# Patient Record
Sex: Male | Born: 1940 | Race: Black or African American | Marital: Single | Smoking: Former smoker
Health system: Southern US, Community
[De-identification: ages and names within clinical notes are randomized; demographics above are authoritative.]

## PROBLEM LIST (undated history)

## (undated) DIAGNOSIS — I639 Cerebral infarction, unspecified: Secondary | ICD-10-CM

## (undated) DIAGNOSIS — R569 Unspecified convulsions: Secondary | ICD-10-CM

## (undated) HISTORY — DX: Cerebral infarction, unspecified: I63.9

---

## 2012-05-10 ENCOUNTER — Emergency Department (HOSPITAL_COMMUNITY): Payer: Medicare Other

## 2012-05-10 ENCOUNTER — Inpatient Hospital Stay (HOSPITAL_COMMUNITY)
Admission: EM | Admit: 2012-05-10 | Discharge: 2012-05-13 | DRG: 064 | Disposition: A | Discharge status: Rehabilitation Facility | Payer: Medicare Other | Attending: Internal Medicine | Admitting: Internal Medicine

## 2012-05-10 ENCOUNTER — Encounter (HOSPITAL_COMMUNITY): Payer: Self-pay | Admitting: Emergency Medicine

## 2012-05-10 DIAGNOSIS — I6789 Other cerebrovascular disease: Secondary | ICD-10-CM | POA: Diagnosis present

## 2012-05-10 DIAGNOSIS — E41 Nutritional marasmus: Secondary | ICD-10-CM | POA: Diagnosis present

## 2012-05-10 DIAGNOSIS — Z79899 Other long term (current) drug therapy: Secondary | ICD-10-CM

## 2012-05-10 DIAGNOSIS — E041 Nontoxic single thyroid nodule: Secondary | ICD-10-CM | POA: Diagnosis present

## 2012-05-10 DIAGNOSIS — I639 Cerebral infarction, unspecified: Secondary | ICD-10-CM

## 2012-05-10 DIAGNOSIS — F17200 Nicotine dependence, unspecified, uncomplicated: Secondary | ICD-10-CM | POA: Diagnosis present

## 2012-05-10 DIAGNOSIS — I6350 Cerebral infarction due to unspecified occlusion or stenosis of unspecified cerebral artery: Principal | ICD-10-CM | POA: Diagnosis present

## 2012-05-10 DIAGNOSIS — I63512 Cerebral infarction due to unspecified occlusion or stenosis of left middle cerebral artery: Secondary | ICD-10-CM | POA: Diagnosis present

## 2012-05-10 DIAGNOSIS — G40909 Epilepsy, unspecified, not intractable, without status epilepticus: Secondary | ICD-10-CM | POA: Diagnosis present

## 2012-05-10 DIAGNOSIS — G8190 Hemiplegia, unspecified affecting unspecified side: Secondary | ICD-10-CM | POA: Diagnosis present

## 2012-05-10 DIAGNOSIS — Z681 Body mass index (BMI) 19 or less, adult: Secondary | ICD-10-CM

## 2012-05-10 DIAGNOSIS — I10 Essential (primary) hypertension: Secondary | ICD-10-CM

## 2012-05-10 HISTORY — DX: Unspecified convulsions: R56.9

## 2012-05-10 LAB — CBC
Hemoglobin: 16.8 g/dL (ref 13.0–17.0)
MCH: 32.6 pg (ref 26.0–34.0)
Platelets: 247 10*3/uL (ref 150–400)
RBC: 5.15 MIL/uL (ref 4.22–5.81)
WBC: 8.7 10*3/uL (ref 4.0–10.5)

## 2012-05-10 LAB — PROTIME-INR: INR: 1 (ref 0.00–1.49)

## 2012-05-10 LAB — DIFFERENTIAL
Eosinophils Absolute: 0.1 10*3/uL (ref 0.0–0.7)
Lymphocytes Relative: 30 % (ref 12–46)
Lymphs Abs: 2.6 10*3/uL (ref 0.7–4.0)
Monocytes Relative: 8 % (ref 3–12)
Neutrophils Relative %: 60 % (ref 43–77)

## 2012-05-10 LAB — URINALYSIS, ROUTINE W REFLEX MICROSCOPIC
Bilirubin Urine: NEGATIVE
Hgb urine dipstick: NEGATIVE
Protein, ur: NEGATIVE mg/dL
Urobilinogen, UA: 1 mg/dL (ref 0.0–1.0)

## 2012-05-10 LAB — COMPREHENSIVE METABOLIC PANEL
ALT: 11 U/L (ref 0–53)
Alkaline Phosphatase: 95 U/L (ref 39–117)
BUN: 7 mg/dL (ref 6–23)
CO2: 25 mEq/L (ref 19–32)
Chloride: 104 mEq/L (ref 96–112)
GFR calc Af Amer: >90 mL/min (ref >90)
GFR calc non Af Amer: >90 mL/min (ref >90)
Glucose, Bld: 103 mg/dL — ABNORMAL HIGH (ref 70–99)
Potassium: 3.7 mEq/L (ref 3.5–5.1)
Sodium: 141 mEq/L (ref 135–145)
Total Bilirubin: 0.5 mg/dL (ref 0.3–1.2)
Total Protein: 7.5 g/dL (ref 6.0–8.3)

## 2012-05-10 LAB — POCT I-STAT, CHEM 8
Calcium, Ion: 1.19 mmol/L (ref 1.13–1.30)
Creatinine, Ser: 0.6 mg/dL (ref 0.50–1.35)
Glucose, Bld: 104 mg/dL — ABNORMAL HIGH (ref 70–99)
Hemoglobin: 17.3 g/dL — ABNORMAL HIGH (ref 13.0–17.0)
TCO2: 24 mmol/L (ref 0–100)

## 2012-05-10 LAB — RAPID URINE DRUG SCREEN, HOSP PERFORMED
Amphetamines: NOT DETECTED
Tetrahydrocannabinol: NOT DETECTED

## 2012-05-10 LAB — TROPONIN I: Troponin I: <0.3 ng/mL (ref <0.30)

## 2012-05-10 MED ORDER — SODIUM CHLORIDE 0.9 % IV SOLN
1000.0000 mg | Freq: Two times a day (BID) | INTRAVENOUS | Status: DC
  Administered 2012-05-11 – 2012-05-12 (×4): 1000 mg via INTRAVENOUS
  Filled 2012-05-10 (×7): qty 10

## 2012-05-10 MED ORDER — SODIUM CHLORIDE 0.9 % IV SOLN: INTRAVENOUS | Status: AC

## 2012-05-10 MED ORDER — HEPARIN SODIUM (PORCINE) 5000 UNIT/ML IJ SOLN
5000.0000 [IU] | Freq: Three times a day (TID) | INTRAMUSCULAR | Status: DC
  Administered 2012-05-11 – 2012-05-13 (×9): 5000 [IU] via SUBCUTANEOUS
  Filled 2012-05-10 (×10): qty 1

## 2012-05-10 MED ORDER — SODIUM CHLORIDE 0.9 % IV SOLN
INTRAVENOUS | Status: DC
  Administered 2012-05-11: via INTRAVENOUS

## 2012-05-10 NOTE — ED Provider Notes (Signed)
History     CSN: [id number]  Arrival date & time 05/10/12  1814   First MD Initiated Contact with Patient 05/10/12 1836      Chief Complaint  Patient presents with  . Stroke Symptoms     HPI Pt was seen at 1845.   Per pt, c/o gradual onset and persistence of constant RUE and RLE "weakness" that began when he woke up yesterday morning.  Pt states he "just can't hold anything" with is right hand and is "dragging" his right leg when he walks.  Pt's family states he called them today and told them his symptoms, they then called EMS.  Pt denies visual changes, no tingling/numbness in extremities, no dysphagia. Denies CP/palpitations, no SOB/cough, no abd pain, no N/V/D, no fevers, no injury.     Past Medical History  Diagnosis Date  . Seizures     History reviewed. No pertinent past surgical history.    History  Substance Use Topics  . Smoking status: Current Every Day Smoker -- 0.50 packs/day for 55 years    Types: Cigarettes  . Smokeless tobacco: Not on file  . Alcohol Use: No      Review of Systems ROS: Statement: All systems negative except as marked or noted in the HPI; Constitutional: Negative for fever and chills. ; ; Eyes: Negative for eye pain, redness and discharge. ; ; ENMT: Negative for ear pain, hoarseness, nasal congestion, sinus pressure and sore throat. ; ; Cardiovascular: Negative for chest pain, palpitations, diaphoresis, dyspnea and peripheral edema. ; ; Respiratory: Negative for cough, wheezing and stridor. ; ; Gastrointestinal: Negative for nausea, vomiting, diarrhea, abdominal pain, blood in stool, hematemesis, jaundice and rectal bleeding. . ; ; Genitourinary: Negative for dysuria, flank pain and hematuria. ; ; Musculoskeletal: Negative for back pain and neck pain. Negative for swelling and trauma.; ; Skin: Negative for pruritus, rash, abrasions, blisters, bruising and skin lesion.; ; Neuro: +extremity weakness. Negative for headache, lightheadedness and neck  stiffness. Negative for weakness, altered level of consciousness , altered mental status, paresthesias, involuntary movement, seizure and syncope.       Allergies  Review of patient's allergies indicates no known allergies.  Home Medications   Current Outpatient Rx  Name  Route  Sig  Dispense  Refill  . levETIRAcetam (KEPPRA XR) 500 MG 24 hr tablet   Oral   Take 2,000 mg by mouth at bedtime.           BP 160/98  Pulse 68  Temp(Src) 98.2 F (36.8 C) (Oral)  Resp 18  SpO2 100%  Physical Exam 1850: Physical examination:  Nursing notes reviewed; Vital signs and O2 SAT reviewed;  Constitutional: Well developed, Well nourished, Well hydrated, In no acute distress; Head:  Normocephalic, atraumatic; Eyes: EOMI, PERRL, No scleral icterus; ENMT: Mouth and pharynx normal, Mucous membranes moist; Neck: Supple, Full range of motion, No lymphadenopathy; Cardiovascular: Regular rate and rhythm, No murmur, rub, or gallop; Respiratory: Breath sounds clear & equal bilaterally, No rales, rhonchi, wheezes.  Speaking full sentences with ease, Normal respiratory effort/excursion; Chest: Nontender, Movement normal; Abdomen: Soft, Nontender, Nondistended, Normal bowel sounds; Genitourinary: No CVA tenderness; Extremities: Pulses normal, No tenderness, No edema, No calf edema or asymmetry.; Neuro: AA&Ox3, Major CN grossly intact.  Strength 5/5 LUE and LLE, 4/5 strength RUE and RLE.  DTR 2/4 equal bilat UE's and LE's.  No gross sensory deficits.  Normal cerebellar testing bilat UE's (finger-nose) and LE's (heel-shin).  +right pronator drift.  Speech clear.  No facial droop..; Skin: Color normal, Warm, Dry.   ED Course  Procedures   1900:  Pt not code stroke or TPA candidate due to being outside time window (woke up with symptoms yesterday morning).  ED RN stated fluids dripped from pt's right lower mouth when she attempted swallow screen.  Bedside swallow screen ordered.  Will continue IVF as workup  progresses.   2130:  T/C from MRI Tech: states prelim reading on MRI appears to be +CVA, will add MRA brain now. Dx and testing d/w pt and family.  Questions answered.  Verb understanding, agreeable to admit. T/C to [facility]. Julian Reil, case discussed, including:  HPI, pertinent PM/SHx, VS/PE, dx testing, ED course and treatment:  Agreeable to admit, requests to write temporary orders, obtain tele bed to team 10.   MDM  MDM Reviewed: nursing note and vitals Interpretation: labs, ECG, x-ray and CT scan    Date: 05/10/2012  Rate: 72  Rhythm: normal sinus rhythm  QRS Axis: normal  Intervals: normal  ST/T Wave abnormalities: normal  Conduction Disutrbances:none  Narrative Interpretation:   Old EKG Reviewed: none available.  Results for orders placed during the hospital encounter of 05/10/12  ETHANOL      Result Value Range   Alcohol, Ethyl (B) <11  0 - 11 mg/dL  PROTIME-INR      Result Value Range   Prothrombin Time 13.1  11.6 - 15.2 seconds   INR 1.00  0.00 - 1.49  APTT      Result Value Range   aPTT 32  24 - 37 seconds  CBC      Result Value Range   WBC 8.7  4.0 - 10.5 K/uL   RBC 5.15  4.22 - 5.81 MIL/uL   Hemoglobin 16.8  13.0 - 17.0 g/dL   HCT 30.8  65.7 - 84.6 %   MCV 89.9  78.0 - 100.0 fL   MCH 32.6  26.0 - 34.0 pg   MCHC 36.3 (*) 30.0 - 36.0 g/dL   RDW 96.2  95.2 - 84.1 %   Platelets 247  150 - 400 K/uL  DIFFERENTIAL      Result Value Range   Neutrophils Relative 60  43 - 77 %   Neutro Abs 5.2  1.7 - 7.7 K/uL   Lymphocytes Relative 30  12 - 46 %   Lymphs Abs 2.6  0.7 - 4.0 K/uL   Monocytes Relative 8  3 - 12 %   Monocytes Absolute 0.7  0.1 - 1.0 K/uL   Eosinophils Relative 2  0 - 5 %   Eosinophils Absolute 0.1  0.0 - 0.7 K/uL   Basophils Relative 1  0 - 1 %   Basophils Absolute 0.1  0.0 - 0.1 K/uL  COMPREHENSIVE METABOLIC PANEL      Result Value Range   Sodium 141  135 - 145 mEq/L   Potassium 3.7  3.5 - 5.1 mEq/L   Chloride 104  96 - 112 mEq/L   CO2 25  19  - 32 mEq/L   Glucose, Bld 103 (*) 70 - 99 mg/dL   BUN 7  6 - 23 mg/dL   Creatinine, Ser 3.24  0.50 - 1.35 mg/dL   Calcium 9.8  8.4 - 40.1 mg/dL   Total Protein 7.5  6.0 - 8.3 g/dL   Albumin 4.2  3.5 - 5.2 g/dL   AST 20  0 - 37 U/L   ALT 11  0 - 53 U/L   Alkaline  Phosphatase 95  39 - 117 U/L   Total Bilirubin 0.5  0.3 - 1.2 mg/dL   GFR calc non Af Amer >90  >90 mL/min   GFR calc Af Amer >90  >90 mL/min  TROPONIN I      Result Value Range   Troponin I <0.30  <0.30 ng/mL  URINE RAPID DRUG SCREEN (HOSP PERFORMED)      Result Value Range   Opiates NONE DETECTED  NONE DETECTED   Cocaine NONE DETECTED  NONE DETECTED   Benzodiazepines NONE DETECTED  NONE DETECTED   Amphetamines NONE DETECTED  NONE DETECTED   Tetrahydrocannabinol NONE DETECTED  NONE DETECTED   Barbiturates NONE DETECTED  NONE DETECTED  URINALYSIS, ROUTINE W REFLEX MICROSCOPIC      Result Value Range   Color, Urine YELLOW  YELLOW   APPearance CLEAR  CLEAR   Specific Gravity, Urine 1.012  1.005 - 1.030   pH 7.5  5.0 - 8.0   Glucose, UA NEGATIVE  NEGATIVE mg/dL   Hgb urine dipstick NEGATIVE  NEGATIVE   Bilirubin Urine NEGATIVE  NEGATIVE   Ketones, ur NEGATIVE  NEGATIVE mg/dL   Protein, ur NEGATIVE  NEGATIVE mg/dL   Urobilinogen, UA 1.0  0.0 - 1.0 mg/dL   Nitrite NEGATIVE  NEGATIVE   Leukocytes, UA NEGATIVE  NEGATIVE  GLUCOSE, CAPILLARY      Result Value Range   Glucose-Capillary 100 (*) 70 - 99 mg/dL  POCT I-STAT, CHEM 8      Result Value Range   Sodium 143  135 - 145 mEq/L   Potassium 3.6  3.5 - 5.1 mEq/L   Chloride 105  96 - 112 mEq/L   BUN 5 (*) 6 - 23 mg/dL   Creatinine, Ser 1.61  0.50 - 1.35 mg/dL   Glucose, Bld 096 (*) 70 - 99 mg/dL   Calcium, Ion 0.45  4.09 - 1.30 mmol/L   TCO2 24  0 - 100 mmol/L   Hemoglobin 17.3 (*) 13.0 - 17.0 g/dL   HCT 81.1  91.4 - 78.2 %   Dg Chest 2 View 05/10/2012  *RADIOLOGY REPORT*  Clinical Data: Stroke symptoms  CHEST - 2 VIEW  Comparison: None.  Findings:  Cardiomediastinal silhouette is unremarkable.  No acute infiltrate or pleural effusion.  No pulmonary edema.  Mild thoracic spine osteopenia.  IMPRESSION: No active disease.   Original Report Authenticated By: Natasha Mead, M.D.    Ct Head Wo Contrast 05/10/2012  *RADIOLOGY REPORT*  Clinical Data: 72 year old male who awoke with right-sided weakness yesterday.  Hypertension.  CT HEAD WITHOUT CONTRAST  Technique:  Contiguous axial images were obtained from the base of the skull through the vertex without contrast.  Comparison: None.  Findings: Scattered mild paranasal sinus mucosal thickening. Mastoids are clear. Visualized orbit soft tissues are within normal limits.  Visualized scalp soft tissues are within normal limits. No acute osseous abnormality identified.  Mild Calcified atherosclerosis at the skull base.  Chronic-appearing lacuna/hypodensity right pons.  Chronic-appearing posterior left corona radiata lacunar infarct.  Mildly more indistinct central left corona radiata hypodensity. No midline shift, mass effect, or evidence of mass lesion.  No acute intracranial hemorrhage identified.  No cortical encephalomalacia. No suspicious intracranial vascular hyperdensity. No evidence of cortically based acute infarction identified.  IMPRESSION: Chronic small vessel ischemia.  Age indeterminate area of involvement in the left corona radiata (image 16).  Otherwise no evidence of acute intracranial abnormality.   Original Report Authenticated By: Erskine Speed, M.D.  Laray Anger, DO 05/12/12 1601

## 2012-05-10 NOTE — ED Notes (Signed)
Per EMS pt came from home where he woke up with Right sided weakness yesterday. Pt was positive for right side drift and left leg dragging with ambulation. Pt denies pain. Also c/o numbness to right arm. No hx of HTN - BP elevated 190/120.

## 2012-05-10 NOTE — ED Notes (Signed)
Pt transported to radiology.

## 2012-05-10 NOTE — ED Notes (Signed)
Pt transported to MRI 

## 2012-05-10 NOTE — H&P (Signed)
[facility] History and Physical  Kyle Dudley AOZ:[id number] DOB: 1940/03/24 DOA: 05/10/2012  Referring physician: ED PCP: Default, Provider, MD  Specialists: None  Chief Complaint: R sided weakness  HPI: Kyle Dudley is a 72 y.o. male who presents with RUE and RLE weakness, described as dragging his leg, patient is slightly confused / unclear about how long it has been going on but basically has been going on for either 24 or 48 hours (out of TPA window).  His only previous medical history is a history of seizure disorder for which he takes keppra though his sister had a stroke in 2000.  In the ED the patient was found to have an acute stroke on MRI, hospitalist is admitting.  Review of Systems: 12 systems reviewed and otherwise negative.  Past Medical History  Diagnosis Date  . Seizures    History reviewed. No pertinent past surgical history. Social History:  reports that he has been smoking Cigarettes.  He has a 27.5 pack-year smoking history. He does not have any smokeless tobacco history on file. He reports that he does not drink alcohol. His drug history is not on file.   No Known Allergies  Family History  Problem Relation Age of Onset  . Stroke Sister     2000    Prior to Admission medications   Medication Sig Start Date End Date Taking? Authorizing Provider  levETIRAcetam (KEPPRA XR) 500 MG 24 hr tablet Take 2,000 mg by mouth at bedtime.   Yes Historical Provider, MD   Physical Exam: Filed Vitals:   05/10/12 1829 05/10/12 1836 05/10/12 2300 05/10/12 2330  BP:  160/98 139/88 133/76  Pulse: 68  74 65  Temp: 98.2 F (36.8 C)     TempSrc: Oral     Resp: 18  18 23   SpO2: 100%  99% 99%    General:  NAD, resting comfortably in bed Eyes: PEERLA EOMI ENT: mucous membranes moist Neck: supple w/o JVD Cardiovascular: RRR w/o MRG Respiratory: CTA B Abdomen: soft, nt, nd, bs+ Skin: no rash nor lesion Musculoskeletal: MAE, full ROM all 4 extremities Psychiatric:  normal tone and affect Neurologic: AAOx3, grossly non-focal  Labs on Admission:  Basic Metabolic Panel:  Recent Labs Lab 05/10/12 1845 05/10/12 1911  NA 141 143  K 3.7 3.6  CL 104 105  CO2 25  --   GLUCOSE 103* 104*  BUN 7 5*  CREATININE 0.65 0.60  CALCIUM 9.8  --    Liver Function Tests:  Recent Labs Lab 05/10/12 1845  AST 20  ALT 11  ALKPHOS 95  BILITOT 0.5  PROT 7.5  ALBUMIN 4.2   No results found for this basename: LIPASE, AMYLASE,  in the last 168 hours No results found for this basename: AMMONIA,  in the last 168 hours CBC:  Recent Labs Lab 05/10/12 1845 05/10/12 1911  WBC 8.7  --   NEUTROABS 5.2  --   HGB 16.8 17.3*  HCT 46.3 51.0  MCV 89.9  --   PLT 247  --    Cardiac Enzymes:  Recent Labs Lab 05/10/12 1846  TROPONINI <0.30    BNP (last 3 results) No results found for this basename: PROBNP,  in the last 8760 hours CBG:  Recent Labs Lab 05/10/12 1939  GLUCAP 100*    Radiological Exams on Admission: Dg Chest 2 View  05/10/2012  *RADIOLOGY REPORT*  Clinical Data: Stroke symptoms  CHEST - 2 VIEW  Comparison: None.  Findings: Cardiomediastinal silhouette is unremarkable.  No acute infiltrate or pleural effusion.  No pulmonary edema.  Mild thoracic spine osteopenia.  IMPRESSION: No active disease.   Original Report Authenticated By: Natasha Mead, M.D.    Ct Head Wo Contrast  05/10/2012  *RADIOLOGY REPORT*  Clinical Data: 72 year old male who awoke with right-sided weakness yesterday.  Hypertension.  CT HEAD WITHOUT CONTRAST  Technique:  Contiguous axial images were obtained from the base of the skull through the vertex without contrast.  Comparison: None.  Findings: Scattered mild paranasal sinus mucosal thickening. Mastoids are clear. Visualized orbit soft tissues are within normal limits.  Visualized scalp soft tissues are within normal limits. No acute osseous abnormality identified.  Mild Calcified atherosclerosis at the skull base.   Chronic-appearing lacuna/hypodensity right pons.  Chronic-appearing posterior left corona radiata lacunar infarct.  Mildly more indistinct central left corona radiata hypodensity. No midline shift, mass effect, or evidence of mass lesion.  No acute intracranial hemorrhage identified.  No cortical encephalomalacia. No suspicious intracranial vascular hyperdensity. No evidence of cortically based acute infarction identified.  IMPRESSION: Chronic small vessel ischemia.  Age indeterminate area of involvement in the left corona radiata (image 16).  Otherwise no evidence of acute intracranial abnormality.   Original Report Authenticated By: Erskine Speed, M.D.    Mr Angiogram Head Wo Contrast  05/10/2012  *RADIOLOGY REPORT*  Clinical Data:  Stroke.  Right-sided weakness  MRI HEAD WITHOUT CONTRAST MRA HEAD WITHOUT CONTRAST  Technique:  Multiplanar, multiecho pulse sequences of the brain and surrounding structures were obtained without intravenous contrast. Angiographic images of the head were obtained using MRA technique without contrast.  Comparison:  CT head 05/10/2012  MRI HEAD  Findings:  Acute infarct in the centrum semiovale on the left measuring approximately 15 mm in diameter.  No other acute infarct identified.  Chronic lacunar infarctions in the pons bilaterally.  Chronic infarct in the right thalamus.  Mild chronic microvascular ischemic change in the cerebral white matter.  Small chronic infarct right cerebellum.  Negative for hemorrhage or mass.  Ventricle size is normal.  No shift to the midline structures.  IMPRESSION: Acute infarct in the left centrum semiovale.  Moderate chronic ischemic changes.  MRA HEAD  Findings: Both vertebral arteries are patent to the basilar.  Left vertebral artery is dominant.  The basilar is patent.  Posterior cerebral artery is patent bilaterally.  Posterior cerebral arteries are patent bilaterally.  Right internal carotid artery is patent.  Right anterior and middle cerebral  arteries are patent bilaterally.  Mild stenosis right A1 segment.  The left internal carotid artery is widely patent.  The left anterior and middle cerebral arteries are widely patent.  Negative for cerebral aneurysm.  IMPRESSION: No significant intracranial stenosis.   Original Report Authenticated By: Janeece Riggers, M.D.    Mr Brain Wo Contrast  05/10/2012  *RADIOLOGY REPORT*  Clinical Data:  Stroke.  Right-sided weakness  MRI HEAD WITHOUT CONTRAST MRA HEAD WITHOUT CONTRAST  Technique:  Multiplanar, multiecho pulse sequences of the brain and surrounding structures were obtained without intravenous contrast. Angiographic images of the head were obtained using MRA technique without contrast.  Comparison:  CT head 05/10/2012  MRI HEAD  Findings:  Acute infarct in the centrum semiovale on the left measuring approximately 15 mm in diameter.  No other acute infarct identified.  Chronic lacunar infarctions in the pons bilaterally.  Chronic infarct in the right thalamus.  Mild chronic microvascular ischemic change in the cerebral white matter.  Small chronic infarct right cerebellum.  Negative  for hemorrhage or mass.  Ventricle size is normal.  No shift to the midline structures.  IMPRESSION: Acute infarct in the left centrum semiovale.  Moderate chronic ischemic changes.  MRA HEAD  Findings: Both vertebral arteries are patent to the basilar.  Left vertebral artery is dominant.  The basilar is patent.  Posterior cerebral artery is patent bilaterally.  Posterior cerebral arteries are patent bilaterally.  Right internal carotid artery is patent.  Right anterior and middle cerebral arteries are patent bilaterally.  Mild stenosis right A1 segment.  The left internal carotid artery is widely patent.  The left anterior and middle cerebral arteries are widely patent.  Negative for cerebral aneurysm.  IMPRESSION: No significant intracranial stenosis.   Original Report Authenticated By: Janeece Riggers, M.D.     EKG: Independently  reviewed.  Assessment/Plan Principal Problem:   Acute ischemic left MCA stroke Active Problems:   Seizure disorder   1. Acute ischemic CVA - seen on MRI, on stroke pathway, 2d echo and carotid dopplers pending, neuro consult pending, neuro checks, Asprin 81 daily, lipid panel and a1c pending. 2. Seizure disorder - given that patient failed his swallow screen in ED will convert to keppra 1000 mg iv BID until SLP clears him with swallow eval.  Suspect that he will pass the formal swallow eval as he wasn't having any difficulty with choking but had liquid dribbling from his mouth as the reason for failure.  Neurology consulted and evaluating patient.  Code Status: Full Code (must indicate code status--if unknown or must be presumed, indicate so) Family Communication: Spoke with sister at bedside (indicate person spoken with, if applicable, with phone number if by telephone) Disposition Plan: Admit to inpatient (indicate anticipated LOS)  Time spent: 70 min  Janaiah Vetrano M. [facility] Pager [phone]  If 7PM-7AM, please contact night-coverage [email or web address] Password [facility] 05/10/2012, 11:42 PM

## 2012-05-10 NOTE — ED Notes (Signed)
CBG 100 

## 2012-05-11 ENCOUNTER — Inpatient Hospital Stay (HOSPITAL_COMMUNITY): Payer: Medicare Other

## 2012-05-11 ENCOUNTER — Encounter (HOSPITAL_COMMUNITY): Payer: Self-pay | Admitting: Internal Medicine

## 2012-05-11 DIAGNOSIS — I6789 Other cerebrovascular disease: Secondary | ICD-10-CM

## 2012-05-11 DIAGNOSIS — G40909 Epilepsy, unspecified, not intractable, without status epilepticus: Secondary | ICD-10-CM | POA: Diagnosis present

## 2012-05-11 DIAGNOSIS — I6350 Cerebral infarction due to unspecified occlusion or stenosis of unspecified cerebral artery: Secondary | ICD-10-CM

## 2012-05-11 DIAGNOSIS — I63512 Cerebral infarction due to unspecified occlusion or stenosis of left middle cerebral artery: Secondary | ICD-10-CM | POA: Diagnosis present

## 2012-05-11 LAB — GLUCOSE, CAPILLARY
Glucose-Capillary: 89 mg/dL (ref 70–99)
Glucose-Capillary: 81 mg/dL (ref 70–99)
Glucose-Capillary: 98 mg/dL (ref 70–99)

## 2012-05-11 LAB — LIPID PANEL
Cholesterol: 143 mg/dL (ref 0–200)
HDL: 41 mg/dL (ref >39)
LDL Cholesterol: 93 mg/dL (ref 0–99)
Triglycerides: 47 mg/dL (ref <150)
VLDL: 9 mg/dL (ref 0–40)

## 2012-05-11 LAB — URINE CULTURE: Colony Count: NO GROWTH

## 2012-05-11 MED ORDER — ASPIRIN 300 MG RE SUPP
300.0000 mg | Freq: Once | RECTAL | Status: AC
Start: 2012-05-11 — End: 2012-05-11
  Administered 2012-05-11: 300 mg via RECTAL
  Filled 2012-05-11: qty 1

## 2012-05-11 MED ORDER — ASPIRIN 81 MG PO CHEW
81.0000 mg | CHEWABLE_TABLET | Freq: Every day | ORAL | Status: DC
  Administered 2012-05-12 – 2012-05-13 (×2): 81 mg via ORAL
  Filled 2012-05-11 (×3): qty 1

## 2012-05-11 NOTE — Evaluation (Addendum)
Physical Therapy Evaluation Patient Details Name: Kyle Dudley MRN: [id number] DOB: 02/16/1940 Today's Date: 05/11/2012 Time: 4098-1191 PT Time Calculation (min): 39 min  PT Assessment / Plan / Recommendation Clinical Impression  72 y/o male adm for right sided weakness RUE>RLE with MRI revealing acute lacunar infarct left CSO. Presents with below impairments affecting independence and PLOF. Patient lives at home alone and was functioning independently prior to admission. Rec CIR at this time to maximize independence for d/c home alone. Will benefit from physical therapy in the acute setting to address below impairments and facilitate safe d/c plan.     PT Assessment  Patient needs continued PT services    Follow Up Recommendations  CIR(pt reports he would like to go home, would require 24 hour assist right now, unsure if his family can provide that)    Does the patient have the potential to tolerate intense rehabilitation      Barriers to Discharge Decreased caregiver support thinks his nephew can help out or stay with him potentially however nephew also has to help out his mother    Equipment Recommendations  None recommended by PT    Recommendations for Other Services OT consult;Speech consult   Frequency Min 4X/week    Precautions / Restrictions Precautions Precautions: Fall Restrictions Weight Bearing Restrictions: No   Pertinent Vitals/Pain Denies pain      Mobility  Bed Mobility Bed Mobility: Supine to Sit Supine to Sit: 5: Supervision Details for Bed Mobility Assistance: increased time due to weakness Transfers Transfers: Sit to Stand;Stand to Sit Sit to Stand: 4: Min guard;With upper extremity assist Stand to Sit: 4: Min guard Details for Transfer Assistance: gaurding for stability, pt holding onto bed rail throughout standing for increased support and fear of falling Ambulation/Gait Ambulation/Gait Assistance: 4: Min assist Ambulation Distance (Feet): 100  Feet Assistive device: None Ambulation/Gait Assistance Details: min stability assist especially with more challenging activities, staggers when distracted with delayed balance reaction; cueing to exagerrate step with LLE to decrease dragging, this draggin increases as he fatigues; cues for forward gaze Gait Pattern: Step-through pattern;Decreased step length - right;Decreased weight shift to right Gait velocity: slow and gaurded Stairs: No    Exercises General Exercises - Upper Extremity Elbow Flexion: AROM;Both;5 reps;Seated General Exercises - Lower Extremity Hip Flexion/Marching: AROM;Both;10 reps;Seated Toe Raises: AROM;Both;10 reps;Seated Heel Raises: AROM;Both;10 reps;Seated Other Exercises Other Exercises: Forearm pronation/supination x 10 (RUE)   PT Diagnosis: Difficulty walking;Abnormality of gait;Hemiplegia dominant side;Generalized weakness  PT Problem List: Decreased strength;Decreased activity tolerance;Decreased balance;Decreased mobility;Decreased coordination;Decreased knowledge of use of DME PT Treatment Interventions: DME instruction;Gait training;Functional mobility training;Therapeutic activities;Therapeutic exercise;Balance training;Neuromuscular re-education;Patient/family education   PT Goals Acute Rehab PT Goals PT Goal Formulation: With patient Time For Goal Achievement: 05/18/12 Potential to Achieve Goals: Good Pt will go Supine/Side to Sit: with modified independence PT Goal: Supine/Side to Sit - Progress: Goal set today Pt will go Sit to Supine/Side: with modified independence PT Goal: Sit to Supine/Side - Progress: Goal set today Pt will go Sit to Stand: with modified independence PT Goal: Sit to Stand - Progress: Goal set today Pt will go Stand to Sit: with modified independence PT Goal: Stand to Sit - Progress: Goal set today Pt will Transfer Bed to Chair/Chair to Bed: with modified independence PT Transfer Goal: Bed to Chair/Chair to Bed - Progress:  Goal set today Pt will Stand: Independently;3 - 5 min;with no upper extremity support PT Goal: Stand - Progress: Goal set today Pt will Ambulate: >150 feet;with  modified independence;with least restrictive assistive device;with gait velocity >(comment) ft/second (>/=1.8 ft sec ) PT Goal: Ambulate - Progress: Goal set today Pt will Perform Home Exercise Program: Independently PT Goal: Perform Home Exercise Program - Progress: Goal set today  Visit Information  Last PT Received On: 05/11/12 Assistance Needed: +1    Subjective Data  Subjective: Patient very upset and tearful throughout session. "I want to work on my arm" Patient Stated Goal: home   Prior Functioning  Home Living Lives With: Alone Available Help at Discharge: Family;Available PRN/intermittently Type of Home: Independent living facility Home Access: Level entry Home Layout: One level Bathroom Shower/Tub: Engineer, manufacturing systems: Standard Home Adaptive Equipment: None Additional Comments: From what I gather he lives in an ILF, says he has an emergency string in his bathroom Prior Function Level of Independence: Independent Able to Take Stairs?: Yes Driving: Yes Vocation: Retired Musician: No difficulties Dominant Hand: Right    Cognition  Cognition Arousal/Alertness: Awake/alert Behavior During Therapy: WFL for tasks assessed/performed Overall Cognitive Status: Within Functional Limits for tasks assessed    Extremity/Trunk Assessment Right Upper Extremity Assessment RUE ROM/Strength/Tone: Deficits RUE ROM/Strength/Tone Deficits: grossly 3+/5 RUE Sensation: WFL - Light Touch RUE Coordination: Deficits RUE Coordination Deficits: motor delay, decreased grip and fine motor, difficulty with picking objects up from the table Left Upper Extremity Assessment LUE ROM/Strength/Tone: Within functional levels Right Lower Extremity Assessment RLE ROM/Strength/Tone: Deficits RLE  ROM/Strength/Tone Deficits: grossly 4+/5 RLE Sensation: WFL - Light Touch RLE Coordination: Deficits RLE Coordination Deficits: motor delay Left Lower Extremity Assessment LLE ROM/Strength/Tone: Within functional levels Trunk Assessment Trunk Assessment: Normal   Balance Balance Balance Assessed: Yes Dynamic Sitting Balance Dynamic Sitting - Balance Support: No upper extremity supported Dynamic Sitting - Level of Assistance: 5: Stand by assistance Dynamic Sitting Balance - Compensations: when challenged with MMT his trunk tends to drift to the right with decreased awareness, cues to awareness and correction Reach (Patient is able to reach ___ inches to right, left, forward, back): picking up objects of various sizes and reaching accross midline and outside BOS to place or hand them to me (focus on trunk control and grasp activities) (pt in recliner with trunk away from the back of the chair) Dynamic Sitting - Balance Activities: Lateral lean/weight shifting;Reaching for objects;Reaching across midline;Reaching for weighted objects;Trunk control activities Static Standing Balance Static Standing - Balance Support: Left upper extremity supported;No upper extremity supported Static Standing - Level of Assistance: 4: Min assist;5: Stand by assistance Static Standing - Comment/# of Minutes: with LUE support pt needs mingaurdA, without needing minA  High Level Balance High Level Balance Comments: minA with backward walking (even with slow pace), difficulty changing speeds on command, staggering especially with head turns as he becomes distracted  End of Session PT - End of Session Equipment Utilized During Treatment: Gait belt Activity Tolerance: Patient tolerated treatment well Patient left: in chair;with call bell/phone within reach Nurse Communication: Mobility status  GP     [facility] HELEN 05/11/2012, 10:10 AM

## 2012-05-11 NOTE — Progress Notes (Signed)
UR COMPLETED  

## 2012-05-11 NOTE — Progress Notes (Signed)
INITIAL NUTRITION ASSESSMENT  Pt meets criteria for SEVERE MALNUTRITION in the context of chronic illness as evidenced by severe muscle and fat wasting.  DOCUMENTATION CODES Per approved criteria  -Severe malnutrition in the context of chronic illness -Underweight   INTERVENTION: Ensure Complete po BID, each supplement provides 350 kcal and 13 grams of protein.  NUTRITION DIAGNOSIS: Unintentional weight loss related to poor appetite as evidenced by 9% weight loss.   Goal: Pt to meet >/= 90% of their estimated nutrition needs.   Monitor:  PO intake, supplement acceptance, weight trend, labs  Reason for Assessment: Rounds  72 y.o. male  Admitting Dx: Acute ischemic left MCA stroke  ASSESSMENT: Pt with left centrum semiovale infarct. CIR consulting. Pt confirms weight loss PTA, but is unsure of time frame. 24 hr recall reviewed, pt consuming only 2 meals per day which he prepares. Breakfast is usually an egg and grits, Supper is a piece of protein such as a pork chop and mashed potatoes. Pt does like ensure but does not drink consistently.   Height: Ht Readings from Last 1 Encounters:  05/11/12 5\' 4"  (1.626 m)    Weight: Wt Readings from Last 1 Encounters:  05/11/12 105 lb 13.1 oz (48 kg)    Ideal Body Weight: 59 kg  % Ideal Body Weight: 81%  Wt Readings from Last 10 Encounters:  05/11/12 105 lb 13.1 oz (48 kg)    Usual Body Weight: 115 lb   % Usual Body Weight: 91%  BMI:  Body mass index is 18.16 kg/(m^2). Underweight  Estimated Nutritional Needs: Kcal: 1500-1700 Protein: 75-90 grams Fluid: > 1.5 L/day  Skin: no issues noted  Nutrition Focused Physical Exam:  Subcutaneous Fat:  Orbital Region: WNL Upper Arm Region: moderate wasting Thoracic and Lumbar Region: severe wasting  Muscle:  Temple Region: mild wasting Clavicle Bone Region: severe wasting Clavicle and Acromion Bone Region: severe wasting Scapular Bone Region: mild wasting Dorsal Hand:  NA Patellar Region: severe wasting Anterior Thigh Region: severe wasting Posterior Calf Region: moderate wasting  Edema: not present    Diet Order: NPO Meal Completion: 75% per pt report  EDUCATION NEEDS: -No education needs identified at this time  No intake or output data in the 24 hours ending 05/11/12 1238  Last BM: 4/15   Labs:   Recent Labs Lab 05/10/12 1845 05/10/12 1911  NA 141 143  K 3.7 3.6  CL 104 105  CO2 25  --   BUN 7 5*  CREATININE 0.65 0.60  CALCIUM 9.8  --   GLUCOSE 103* 104*    CBG (last 3)   Recent Labs  05/10/12 1939 05/11/12 0135 05/11/12 0628  GLUCAP 100* 89 81    Scheduled Meds: . sodium chloride   Intravenous STAT  . aspirin  81 mg Oral Daily  . aspirin  300 mg Rectal Once  . heparin  5,000 Units Subcutaneous Q8H  . levETIRAcetam  1,000 mg Intravenous Q12H    Continuous Infusions:   Past Medical History  Diagnosis Date  . Seizures     History reviewed. No pertinent past surgical history.  Kendell Bane RD, LDN, CNSC [phone] Pager [phone] After Hours Pager

## 2012-05-11 NOTE — Progress Notes (Signed)
  Echocardiogram 2D Echocardiogram has been performed.  Saleh Ulbrich, [facility] 05/11/2012, 11:52 AM

## 2012-05-11 NOTE — Evaluation (Addendum)
Clinical/Bedside Swallow Evaluation Patient Details  Name: Kyle Dudley MRN: [id number] Date of Birth: 04-03-1940  Today's Date: 05/11/2012 Time: 1412-     Past Medical History:  Past Medical History  Diagnosis Date  . Seizures    Past Surgical History: History reviewed. No pertinent past surgical history. HPI:  Kyle Dudley is a 72 y.o. male who presents with RUE and RLE weakness, described as dragging his leg, patient is slightly confused / unclear about how long it has been going on but basically has been going on for either 24 or 48 hours (out of TPA window).  His only previous medical history is a history of seizure disorder for which he takes keppra though his sister had a stroke in 2000.  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  Chronic infarcts in right thalamus.  CXR No active disease.   Assessment / Plan / Recommendation Clinical Impression  Pt. presents with mild oral motor weakness (right) which did not appear to affect mastication/manipulation or transit of solid bolus texture.  No s/s evident with cup or straw sips thin water, puree or cracker. Pt. at higher aspiration risk with acute CVA and chronic bilateral pons infarcts.  herapeutic intervention included general swallow precautions with rationale on potential difficulty with straws. Recommend pt. initiate a regular texture consistency diet and thin liquids, straws ok and pills with water (if difficutly pills may be given with puree).  No f/u recommended for swallow, however will assess speech-language-cognitive function next date.          Aspiration Risk  Mild    Diet Recommendation Regular;Thin liquid   Liquid Administration via: Cup;Straw Medication Administration: Whole meds with liquid Supervision: Patient able to self feed;Intermittent supervision to cue for compensatory strategies Compensations: Slow rate;Small sips/bites Postural Changes  and/or Swallow Maneuvers: Seated upright 90 degrees    Other  Recommendations Oral Care Recommendations: Oral care BID   Follow Up Recommendations  None    Frequency and Duration        Pertinent Vitals/Pain No evidence         Swallow Study         Oral/Motor/Sensory Function Overall Oral Motor/Sensory Function: Impaired Labial ROM: Reduced right Labial Symmetry: Within Functional Limits Labial Strength: Reduced Labial Sensation: Within Functional Limits Lingual ROM: Within Functional Limits (deviate to right) Lingual Symmetry: Abnormal symmetry right (deviate to right) Lingual Strength: Within Functional Limits Lingual Sensation: Within Functional Limits Facial ROM: Within Functional Limits Facial Symmetry: Within Functional Limits Facial Strength: Within Functional Limits Facial Sensation: Within Functional Limits Velum: Within Functional Limits Mandible: Within Functional Limits   Ice Chips Ice chips: Not tested   Thin Liquid Thin Liquid: Within functional limits Presentation: Cup;Straw    Nectar Thick Nectar Thick Liquid: Not tested   Honey Thick Honey Thick Liquid: Not tested   Puree Puree: Within functional limits Presentation: Spoon;Self Fed   Solid       Solid: Within functional limits       Breck Coons [employer]Ed [employer] [phone]  05/11/2012

## 2012-05-11 NOTE — Progress Notes (Signed)
*  PRELIMINARY RESULTS* Vascular Ultrasound Carotid Duplex (Doppler) has been completed.  Preliminary findings: Bilateral:  No evidence of hemodynamically significant internal carotid artery stenosis.   Vertebral artery flow is antegrade.      Farrel Demark, RDMS, RVT  05/11/2012, 11:15 AM

## 2012-05-11 NOTE — Progress Notes (Signed)
Stroke Team Progress Note  HISTORY Kyle Dudley is an 72 y.o. male, right handed with a past medical history significant for an isolated seizure four years ago for which he takes keppra 2,000 mg daily, brought to [facility] by his family due to right sided weakness and dragging ofthe right leg. Kyle Dudley stated that he never had similar symptoms before but this past Monday 05/09/2012 he woke up and noticed that he was not able to grab with his right hand and the right leg was weak. He did not experienced face weakness, headache, vertigo, double vision, difficulty swallowing, slurred speech, language or vision impairment. Stated that he has been dragging the right leg ever since. CT brain revealed age indeterminate hypodensity in the left CR which was confirmed to be an acute lacunar infarct right CR on MRI-DWI. MRA brain unimpressive. Patient was not a TPA candidate secondary to delay in arrival. He was admitted for further evaluation and treatment.  SUBJECTIVE Patient up in chair at bedside. No family is at the bedside.  Overall he feels his condition is stable.   OBJECTIVE Most recent Vital Signs: Filed Vitals:   05/10/12 2330 05/11/12 0200 05/11/12 0400 05/11/12 0600  BP: 133/76 176/96 142/72 151/82  Pulse: 65 71 78 84  Temp:  97.3 F (36.3 C) 97.8 F (36.6 C) 97.9 F (36.6 C)  TempSrc:  Oral Oral Oral  Resp: 23 20 20 20   Height:  5\' 4"  (1.626 m)    Weight:  48 kg (105 lb 13.1 oz)    SpO2: 99% 98% 100% 98%   CBG (last 3)   Recent Labs  05/10/12 1939 05/11/12 0135 05/11/12 0628  GLUCAP 100* 89 81    IV Fluid Intake:   . sodium chloride 75 mL/hr at 05/11/12 0022    MEDICATIONS  . sodium chloride   Intravenous STAT  . aspirin  81 mg Oral Daily  . aspirin  300 mg Rectal Once  . heparin  5,000 Units Subcutaneous Q8H  . levETIRAcetam  1,000 mg Intravenous Q12H   PRN:    Diet:  NPO  Activity:  Bedrest DVT Prophylaxis:  Heparin 5000 units sq tid  CLINICALLY SIGNIFICANT STUDIES Basic  Metabolic Panel:  Recent Labs Lab 05/10/12 1845 05/10/12 1911  NA 141 143  K 3.7 3.6  CL 104 105  CO2 25  --   GLUCOSE 103* 104*  BUN 7 5*  CREATININE 0.65 0.60  CALCIUM 9.8  --    Liver Function Tests:  Recent Labs Lab 05/10/12 1845  AST 20  ALT 11  ALKPHOS 95  BILITOT 0.5  PROT 7.5  ALBUMIN 4.2   CBC:  Recent Labs Lab 05/10/12 1845 05/10/12 1911  WBC 8.7  --   NEUTROABS 5.2  --   HGB 16.8 17.3*  HCT 46.3 51.0  MCV 89.9  --   PLT 247  --    Coagulation:  Recent Labs Lab 05/10/12 1845  LABPROT 13.1  INR 1.00   Cardiac Enzymes:  Recent Labs Lab 05/10/12 1846  TROPONINI <0.30   Urinalysis:  Recent Labs Lab 05/10/12 2032  COLORURINE YELLOW  LABSPEC 1.012  PHURINE 7.5  GLUCOSEU NEGATIVE  HGBUR NEGATIVE  BILIRUBINUR NEGATIVE  KETONESUR NEGATIVE  PROTEINUR NEGATIVE  UROBILINOGEN 1.0  NITRITE NEGATIVE  LEUKOCYTESUR NEGATIVE   Lipid Panel    Component Value Date/Time   CHOL 143 05/11/2012 0445   TRIG 47 05/11/2012 0445   HDL 41 05/11/2012 0445   CHOLHDL 3.5 05/11/2012 0445  VLDL 9 05/11/2012 0445   LDLCALC 93 05/11/2012 0445   HgbA1C  No results found for this basename: HGBA1C    Urine Drug Screen:     Component Value Date/Time   LABOPIA NONE DETECTED 05/10/2012 2032   COCAINSCRNUR NONE DETECTED 05/10/2012 2032   LABBENZ NONE DETECTED 05/10/2012 2032   AMPHETMU NONE DETECTED 05/10/2012 2032   THCU NONE DETECTED 05/10/2012 2032   LABBARB NONE DETECTED 05/10/2012 2032    Alcohol Level:  Recent Labs Lab 05/10/12 1845  ETH <11   CT of the brain  05/10/2012 chronic small vessel ischemia.  Age indeterminate area of involvement in the left corona radiata (image 16).  Otherwise no evidence of acute intracranial abnormality.    MRI of the brain  05/10/2012  Acute infarct in the left centrum semiovale.  Moderate chronic ischemic changes.  MRA of the brain  05/10/2012   No significant intracranial stenosis.   2D Echocardiogram    Carotid Doppler     CXR  05/10/2012   No active disease.  EKG     Therapy Recommendations CIR  Physical Exam  General: The patient is alert and cooperative at the time of the examination.  Skin: No significant peripheral edema is noted.   Neurologic Exam  Cranial nerves: Facial symmetry is present. Speech is slightly dysarthric. Extraocular movements are full. Visual fields are full.  Motor: The patient has good strength in all 4 extremities, but there is slight clumbsiness with the right hand. No drift is seen.  Coordination: The patient has good finger-nose-finger and heel-to-shin bilaterally.  Gait and station: The gait was not tested. No drift is seen.  Reflexes: Deep tendon reflexes are symmetric.      ASSESSMENT Kyle Dudley is a 72 y.o. male presenting with right sided weakness. Imaging confirms a left centrum semiovale infarct. Infarct felt to be thrombotic, though workup is pending. On no antiplatelets prior to admission. Now on aspirin suppository for secondary stroke prevention. Patient with resultant right hemiparesis. Work up underway.   Hypertension  Hx seizures, on keppra PTA  Cigarette smoker  LDL 93  HgbA1c pending  Hospital day # 1  TREATMENT/PLAN  Continue rectal aspirin for secondary stroke prevention.  Complete stroke workup - f/u 2D, carotid doppler  OOB. Therapy evals.  Check swallow  Annie Main, MSN, RN, ANVP-BC, ANP-BC, GNP-BC [facility] Pager: [phone] 05/11/2012 10:14 AM  I have personally obtained a history, examined the patient, evaluated imaging results, and formulated the assessment and plan of care. I agree with the above. Lesly Dukes

## 2012-05-11 NOTE — Progress Notes (Signed)
Rehab Admissions Coordinator Note:  Patient was screened by Trish Mage for appropriateness for an Inpatient Acute Rehab Consult.  Noted PT recommending CIR.  At this time, we are recommending Inpatient Rehab consult.  Trish Mage 05/11/2012, 10:46 AM  I can be reached at [phone].

## 2012-05-11 NOTE — Consult Note (Signed)
Referring Physician: ED    Chief Complaint: right sided weakness.  HPI:                                                                                                                                         Kyle Dudley is an 72 y.o. male, right handed with a past medical history significant for an isolated seizure four years ago for which he takes keppra 2,000 mg daily, brought to [facility] by his family due to right sided weakness and dragging ofthe right leg. Mr. Hoar stated that he never had similar symptoms before but this past Monday he woke up and noticed that he was not able to grab with his right hand and the right leg was weak. He did not experienced face weakness, headache, vertigo, double vision, difficulty swallowing, slurred speech, language or vision impairment. Stated that he has been dragging the right leg ever since. CT brain revealed age indeterminate hypodensity in the left CR which was confirmed to be an acute lacunar infarct right CR on MRI-DWI. MRA brain unimpressive.  Date last known well: uncertain. Time last known well: uncertain. tPA Given: no, late presentation.  Past Medical History  Diagnosis Date  . Seizures     History reviewed. No pertinent past surgical history.  No family history on file. Social History:  reports that he has been smoking Cigarettes.  He has a 27.5 pack-year smoking history. He does not have any smokeless tobacco history on file. He reports that he does not drink alcohol. His drug history is not on file.  Allergies: No Known Allergies  Medications:                                                                                                                           I have reviewed the patient's current medications.  ROS:  History obtained from the patient and chart review.  General ROS: negative for -  chills, fatigue, fever, night sweats, weight gain or weight loss Psychological ROS: negative for - behavioral disorder, hallucinations, memory difficulties, mood swings or suicidal ideation Ophthalmic ROS: negative for - blurry vision, double vision, eye pain or loss of vision ENT ROS: negative for - epistaxis, nasal discharge, oral lesions, sore throat, tinnitus or vertigo Allergy and Immunology ROS: negative for - hives or itchy/watery eyes Hematological and Lymphatic ROS: negative for - bleeding problems, bruising or swollen lymph nodes Endocrine ROS: negative for - galactorrhea, hair pattern changes, polydipsia/polyuria or temperature intolerance Respiratory ROS: negative for - cough, hemoptysis, shortness of breath or wheezing Cardiovascular ROS: negative for - chest pain, dyspnea on exertion, edema or irregular heartbeat Gastrointestinal ROS: negative for - abdominal pain, diarrhea, hematemesis, nausea/vomiting or stool incontinence Genito-Urinary ROS: negative for - dysuria, hematuria, incontinence or urinary frequency/urgency Musculoskeletal ROS: negative for - joint swelling Neurological ROS: as noted in HPI Dermatological ROS: negative for rash and skin lesion changes    Physical exam: pleasant male in no apparent distress. Blood pressure 133/76, pulse 65, temperature 98.2 F (36.8 C), temperature source Oral, resp. rate 23, SpO2 99.00%. Head: normocephalic. Neck: supple, no bruits, no JVD. Cardiac: no murmurs. Lungs: clear. Abdomen: soft, no tender, no mass. Extremities: no edema.   Neurologic Examination:                                                                                                      Mental Status: Alert, awake, oriented x 4, thought content appropriate.  Speech fluent without evidence of aphasia.  Able to follow 3 step commands without difficulty. Cranial Nerves: II: Discs flat bilaterally; Visual fields grossly normal, pupils equal, round, reactive to  light and accommodation III,IV, VI: ptosis not present, extra-ocular motions intact bilaterally V,VII: smile symmetric, facial light touch sensation normal bilaterally VIII: hearing normal bilaterally IX,X: gag reflex present XI: bilateral shoulder shrug XII: midline tongue extension Motor: Significant for mild right sided weakness. Sensory: Pinprick and light touch intact throughout, bilaterally Deep Tendon Reflexes: 1+ and symmetric throughout Plantars: Right: downgoing   Left: downgoing Cerebellar: normal finger-to-nose,  normal heel-to-shin test Gait: no tested. CV: pulses palpable throughout    Results for orders placed during the hospital encounter of 05/10/12 (from the past 48 hour(s))  ETHANOL     Status: None   Collection Time    05/10/12  6:45 PM      Result Value Range   Alcohol, Ethyl (B) <11  0 - 11 mg/dL   Comment:            LOWEST DETECTABLE LIMIT FOR     SERUM ALCOHOL IS 11 mg/dL     FOR MEDICAL PURPOSES ONLY  PROTIME-INR     Status: None   Collection Time    05/10/12  6:45 PM      Result Value Range   Prothrombin Time 13.1  11.6 - 15.2 seconds   INR 1.00  0.00 - 1.49  APTT  Status: None   Collection Time    05/10/12  6:45 PM      Result Value Range   aPTT 32  24 - 37 seconds  CBC     Status: Abnormal   Collection Time    05/10/12  6:45 PM      Result Value Range   WBC 8.7  4.0 - 10.5 K/uL   RBC 5.15  4.22 - 5.81 MIL/uL   Hemoglobin 16.8  13.0 - 17.0 g/dL   HCT 40.9  81.1 - 91.4 %   MCV 89.9  78.0 - 100.0 fL   MCH 32.6  26.0 - 34.0 pg   MCHC 36.3 (*) 30.0 - 36.0 g/dL   RDW 78.2  95.6 - 21.3 %   Platelets 247  150 - 400 K/uL  DIFFERENTIAL     Status: None   Collection Time    05/10/12  6:45 PM      Result Value Range   Neutrophils Relative 60  43 - 77 %   Neutro Abs 5.2  1.7 - 7.7 K/uL   Lymphocytes Relative 30  12 - 46 %   Lymphs Abs 2.6  0.7 - 4.0 K/uL   Monocytes Relative 8  3 - 12 %   Monocytes Absolute 0.7  0.1 - 1.0 K/uL    Eosinophils Relative 2  0 - 5 %   Eosinophils Absolute 0.1  0.0 - 0.7 K/uL   Basophils Relative 1  0 - 1 %   Basophils Absolute 0.1  0.0 - 0.1 K/uL  COMPREHENSIVE METABOLIC PANEL     Status: Abnormal   Collection Time    05/10/12  6:45 PM      Result Value Range   Sodium 141  135 - 145 mEq/L   Potassium 3.7  3.5 - 5.1 mEq/L   Chloride 104  96 - 112 mEq/L   CO2 25  19 - 32 mEq/L   Glucose, Bld 103 (*) 70 - 99 mg/dL   BUN 7  6 - 23 mg/dL   Creatinine, Ser 0.86  0.50 - 1.35 mg/dL   Calcium 9.8  8.4 - 57.8 mg/dL   Total Protein 7.5  6.0 - 8.3 g/dL   Albumin 4.2  3.5 - 5.2 g/dL   AST 20  0 - 37 U/L   ALT 11  0 - 53 U/L   Alkaline Phosphatase 95  39 - 117 U/L   Total Bilirubin 0.5  0.3 - 1.2 mg/dL   GFR calc non Af Amer >90  >90 mL/min   GFR calc Af Amer >90  >90 mL/min   Comment:            The eGFR has been calculated     using the CKD EPI equation.     This calculation has not been     validated in all clinical     situations.     eGFR's persistently     <90 mL/min signify     possible Chronic Kidney Disease.  TROPONIN I     Status: None   Collection Time    05/10/12  6:46 PM      Result Value Range   Troponin I <0.30  <0.30 ng/mL   Comment:            Due to the release kinetics of cTnI,     a negative result within the first hours     of the onset of symptoms does not rule out  myocardial infarction with certainty.     If myocardial infarction is still suspected,     repeat the test at appropriate intervals.  POCT I-STAT, CHEM 8     Status: Abnormal   Collection Time    05/10/12  7:11 PM      Result Value Range   Sodium 143  135 - 145 mEq/L   Potassium 3.6  3.5 - 5.1 mEq/L   Chloride 105  96 - 112 mEq/L   BUN 5 (*) 6 - 23 mg/dL   Creatinine, Ser 9.60  0.50 - 1.35 mg/dL   Glucose, Bld 454 (*) 70 - 99 mg/dL   Calcium, Ion 0.98  1.19 - 1.30 mmol/L   TCO2 24  0 - 100 mmol/L   Hemoglobin 17.3 (*) 13.0 - 17.0 g/dL   HCT 14.7  82.9 - 56.2 %  GLUCOSE, CAPILLARY      Status: Abnormal   Collection Time    05/10/12  7:39 PM      Result Value Range   Glucose-Capillary 100 (*) 70 - 99 mg/dL  URINE RAPID DRUG SCREEN (HOSP PERFORMED)     Status: None   Collection Time    05/10/12  8:32 PM      Result Value Range   Opiates NONE DETECTED  NONE DETECTED   Cocaine NONE DETECTED  NONE DETECTED   Benzodiazepines NONE DETECTED  NONE DETECTED   Amphetamines NONE DETECTED  NONE DETECTED   Tetrahydrocannabinol NONE DETECTED  NONE DETECTED   Barbiturates NONE DETECTED  NONE DETECTED   Comment:            DRUG SCREEN FOR MEDICAL PURPOSES     ONLY.  IF CONFIRMATION IS NEEDED     FOR ANY PURPOSE, NOTIFY LAB     WITHIN 5 DAYS.                LOWEST DETECTABLE LIMITS     FOR URINE DRUG SCREEN     Drug Class       Cutoff (ng/mL)     Amphetamine      1000     Barbiturate      200     Benzodiazepine   200     Tricyclics       300     Opiates          300     Cocaine          300     THC              50  URINALYSIS, ROUTINE W REFLEX MICROSCOPIC     Status: None   Collection Time    05/10/12  8:32 PM      Result Value Range   Color, Urine YELLOW  YELLOW   APPearance CLEAR  CLEAR   Specific Gravity, Urine 1.012  1.005 - 1.030   pH 7.5  5.0 - 8.0   Glucose, UA NEGATIVE  NEGATIVE mg/dL   Hgb urine dipstick NEGATIVE  NEGATIVE   Bilirubin Urine NEGATIVE  NEGATIVE   Ketones, ur NEGATIVE  NEGATIVE mg/dL   Protein, ur NEGATIVE  NEGATIVE mg/dL   Urobilinogen, UA 1.0  0.0 - 1.0 mg/dL   Nitrite NEGATIVE  NEGATIVE   Leukocytes, UA NEGATIVE  NEGATIVE   Comment: MICROSCOPIC NOT DONE ON URINES WITH NEGATIVE PROTEIN, BLOOD, LEUKOCYTES, NITRITE, OR GLUCOSE <1000 mg/dL.   Dg Chest 2 View  05/10/2012  *RADIOLOGY REPORT*  Clinical Data: Stroke symptoms  CHEST - 2 VIEW  Comparison: None.  Findings: Cardiomediastinal silhouette is unremarkable.  No acute infiltrate or pleural effusion.  No pulmonary edema.  Mild thoracic spine osteopenia.  IMPRESSION: No active disease.    Original Report Authenticated By: Natasha Mead, M.D.    Ct Head Wo Contrast  05/10/2012  *RADIOLOGY REPORT*  Clinical Data: 72 year old male who awoke with right-sided weakness yesterday.  Hypertension.  CT HEAD WITHOUT CONTRAST  Technique:  Contiguous axial images were obtained from the base of the skull through the vertex without contrast.  Comparison: None.  Findings: Scattered mild paranasal sinus mucosal thickening. Mastoids are clear. Visualized orbit soft tissues are within normal limits.  Visualized scalp soft tissues are within normal limits. No acute osseous abnormality identified.  Mild Calcified atherosclerosis at the skull base.  Chronic-appearing lacuna/hypodensity right pons.  Chronic-appearing posterior left corona radiata lacunar infarct.  Mildly more indistinct central left corona radiata hypodensity. No midline shift, mass effect, or evidence of mass lesion.  No acute intracranial hemorrhage identified.  No cortical encephalomalacia. No suspicious intracranial vascular hyperdensity. No evidence of cortically based acute infarction identified.  IMPRESSION: Chronic small vessel ischemia.  Age indeterminate area of involvement in the left corona radiata (image 16).  Otherwise no evidence of acute intracranial abnormality.   Original Report Authenticated By: Erskine Speed, M.D.    Mr Angiogram Head Wo Contrast  05/10/2012  *RADIOLOGY REPORT*  Clinical Data:  Stroke.  Right-sided weakness  MRI HEAD WITHOUT CONTRAST MRA HEAD WITHOUT CONTRAST  Technique:  Multiplanar, multiecho pulse sequences of the brain and surrounding structures were obtained without intravenous contrast. Angiographic images of the head were obtained using MRA technique without contrast.  Comparison:  CT head 05/10/2012  MRI HEAD  Findings:  Acute infarct in the centrum semiovale on the left measuring approximately 15 mm in diameter.  No other acute infarct identified.  Chronic lacunar infarctions in the pons bilaterally.  Chronic  infarct in the right thalamus.  Mild chronic microvascular ischemic change in the cerebral white matter.  Small chronic infarct right cerebellum.  Negative for hemorrhage or mass.  Ventricle size is normal.  No shift to the midline structures.  IMPRESSION: Acute infarct in the left centrum semiovale.  Moderate chronic ischemic changes.  MRA HEAD  Findings: Both vertebral arteries are patent to the basilar.  Left vertebral artery is dominant.  The basilar is patent.  Posterior cerebral artery is patent bilaterally.  Posterior cerebral arteries are patent bilaterally.  Right internal carotid artery is patent.  Right anterior and middle cerebral arteries are patent bilaterally.  Mild stenosis right A1 segment.  The left internal carotid artery is widely patent.  The left anterior and middle cerebral arteries are widely patent.  Negative for cerebral aneurysm.  IMPRESSION: No significant intracranial stenosis.   Original Report Authenticated By: Janeece Riggers, M.D.    Mr Brain Wo Contrast  05/10/2012  *RADIOLOGY REPORT*  Clinical Data:  Stroke.  Right-sided weakness  MRI HEAD WITHOUT CONTRAST MRA HEAD WITHOUT CONTRAST  Technique:  Multiplanar, multiecho pulse sequences of the brain and surrounding structures were obtained without intravenous contrast. Angiographic images of the head were obtained using MRA technique without contrast.  Comparison:  CT head 05/10/2012  MRI HEAD  Findings:  Acute infarct in the centrum semiovale on the left measuring approximately 15 mm in diameter.  No other acute infarct identified.  Chronic lacunar infarctions in the pons bilaterally.  Chronic infarct in the right thalamus.  Mild chronic microvascular ischemic  change in the cerebral white matter.  Small chronic infarct right cerebellum.  Negative for hemorrhage or mass.  Ventricle size is normal.  No shift to the midline structures.  IMPRESSION: Acute infarct in the left centrum semiovale.  Moderate chronic ischemic changes.  MRA HEAD   Findings: Both vertebral arteries are patent to the basilar.  Left vertebral artery is dominant.  The basilar is patent.  Posterior cerebral artery is patent bilaterally.  Posterior cerebral arteries are patent bilaterally.  Right internal carotid artery is patent.  Right anterior and middle cerebral arteries are patent bilaterally.  Mild stenosis right A1 segment.  The left internal carotid artery is widely patent.  The left anterior and middle cerebral arteries are widely patent.  Negative for cerebral aneurysm.  IMPRESSION: No significant intracranial stenosis.   Original Report Authenticated By: Janeece Riggers, M.D.       Assessment: 72 y.o. male with mild right sided weakness in the setting of lacunar infarct left CSO, most likely resulting from small vessel disease. Complete stroke work up. Aspirin 81 mg when able to take PO.  Stroke Risk Factors -age, race.  Plan: 1. HgbA1c, fasting lipid panel 2. MRI, MRA  of the brain without contrast (done) 3. PT consult, OT consult, Speech consult 4. Echocardiogram 5. Carotid dopplers 6. Prophylactic therapy-aspirin 81 mg daily. 7. Risk factor modification 8. Telemetry monitoring 9. Frequent neuro checks   Wyatt Portela, MD [facility] [phone]  05/11/2012, 12:03 AM

## 2012-05-11 NOTE — Consult Note (Signed)
Physical Medicine and Rehabilitation Consult Reason for Consult: CVA Referring Physician: [facility]   HPI: Kyle Dudley is a 72 y.o. right-handed male with history of seizure disorder maintained on Keppra and tobacco abuse. Admitted 05/10/2012 with right-sided weakness. 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 MRA of the head with no significant stenosis. Echocardiogram and carotid Dopplers pending. Patient did not receive TPA. Placed on aspirin therapy for stroke prophylaxis as well as subcutaneous heparin for DVT prophylaxis. Physical therapy evaluation completed 05/11/2012 with recommendations of physical medicine rehabilitation consult to consider inpatient rehabilitation services.   Review of Systems  Neurological: Positive for dizziness and seizures.  All other systems reviewed and are negative.   Past Medical History  Diagnosis Date  . Seizures    History reviewed. No pertinent past surgical history. Family History  Problem Relation Age of Onset  . Stroke Sister     2000   Social History:  reports that he has been smoking Cigarettes.  He has a 27.5 pack-year smoking history. He does not have any smokeless tobacco history on file. He reports that he does not drink alcohol. His drug history is not on file. Allergies: No Known Allergies Medications Prior to Admission  Medication Sig Dispense Refill  . levETIRAcetam (KEPPRA XR) 500 MG 24 hr tablet Take 2,000 mg by mouth at bedtime.        Home: Home Living Lives With: Alone Available Help at Discharge: Family;Available PRN/intermittently Type of Home: Independent living facility Home Access: Level entry Home Layout: One level Bathroom Shower/Tub: Engineer, manufacturing systems: Standard Home Adaptive Equipment: None Additional Comments: From what I gather he lives in an ILF, says he has an emergency string in his bathroom  Functional History: Prior Function Able to Take Stairs?: Yes Driving:  Yes Vocation: Retired Functional Status:  Mobility: Bed Mobility Bed Mobility: Supine to Sit Supine to Sit: 5: Supervision Transfers Transfers: Sit to Stand;Stand to Sit Sit to Stand: 4: Min guard;With upper extremity assist Stand to Sit: 4: Min guard Ambulation/Gait Ambulation/Gait Assistance: 4: Min assist Ambulation Distance (Feet): 100 Feet Assistive device: None Ambulation/Gait Assistance Details: min stability assist especially with more challenging activities, staggers when distracted with delayed balance reaction; cueing to exagerrate step with LLE to decrease dragging, this draggin increases as he fatigues; cues for forward gaze Gait Pattern: Step-through pattern;Decreased step length - right;Decreased weight shift to right Gait velocity: slow and gaurded Stairs: No    ADL:    Cognition: Cognition Overall Cognitive Status: Within Functional Limits for tasks assessed Arousal/Alertness: Awake/alert Orientation Level: Oriented X4 Cognition Arousal/Alertness: Awake/alert Behavior During Therapy: WFL for tasks assessed/performed Overall Cognitive Status: Within Functional Limits for tasks assessed  Blood pressure 168/85, pulse 68, temperature 98 F (36.7 C), temperature source Oral, resp. rate 18, height 5\' 4"  (1.626 m), weight 48 kg (105 lb 13.1 oz), SpO2 99.00%. Physical Exam  Vitals reviewed. Constitutional: He is oriented to person, place, and time. No distress.  HENT:  Head: Normocephalic and atraumatic.  Dentition is poor  Eyes:  Pupils round and reactive to light  Neck: Neck supple. No thyromegaly present.  Cardiovascular: Normal rate and regular rhythm.   Pulmonary/Chest: Effort normal and breath sounds normal. No respiratory distress.  Abdominal: Soft. Bowel sounds are normal. He exhibits no distension.  Neurological: He is alert and oriented to person, place, and time.  Right facial weakness with dysarthria and decreased tongue control. Speech is  intelligible when he takes extra time. Right  upper extremity is 3+ to 4/5 with positive pronator drift and decreased FMC. RLE is 4- to 4 with some impairment of FMC. No gross sensory deficits are seen.  Skin: Skin is warm and dry. He is not diaphoretic.  Psychiatric: He has a normal mood and affect. His behavior is normal. Judgment and thought content normal.    Results for orders placed during the hospital encounter of 05/10/12 (from the past 24 hour(s))  ETHANOL     Status: None   Collection Time    05/10/12  6:45 PM      Result Value Range   Alcohol, Ethyl (B) <11  0 - 11 mg/dL  PROTIME-INR     Status: None   Collection Time    05/10/12  6:45 PM      Result Value Range   Prothrombin Time 13.1  11.6 - 15.2 seconds   INR 1.00  0.00 - 1.49  APTT     Status: None   Collection Time    05/10/12  6:45 PM      Result Value Range   aPTT 32  24 - 37 seconds  CBC     Status: Abnormal   Collection Time    05/10/12  6:45 PM      Result Value Range   WBC 8.7  4.0 - 10.5 K/uL   RBC 5.15  4.22 - 5.81 MIL/uL   Hemoglobin 16.8  13.0 - 17.0 g/dL   HCT 16.1  09.6 - 04.5 %   MCV 89.9  78.0 - 100.0 fL   MCH 32.6  26.0 - 34.0 pg   MCHC 36.3 (*) 30.0 - 36.0 g/dL   RDW 40.9  81.1 - 91.4 %   Platelets 247  150 - 400 K/uL  DIFFERENTIAL     Status: None   Collection Time    05/10/12  6:45 PM      Result Value Range   Neutrophils Relative 60  43 - 77 %   Neutro Abs 5.2  1.7 - 7.7 K/uL   Lymphocytes Relative 30  12 - 46 %   Lymphs Abs 2.6  0.7 - 4.0 K/uL   Monocytes Relative 8  3 - 12 %   Monocytes Absolute 0.7  0.1 - 1.0 K/uL   Eosinophils Relative 2  0 - 5 %   Eosinophils Absolute 0.1  0.0 - 0.7 K/uL   Basophils Relative 1  0 - 1 %   Basophils Absolute 0.1  0.0 - 0.1 K/uL  COMPREHENSIVE METABOLIC PANEL     Status: Abnormal   Collection Time    05/10/12  6:45 PM      Result Value Range   Sodium 141  135 - 145 mEq/L   Potassium 3.7  3.5 - 5.1 mEq/L   Chloride 104  96 - 112 mEq/L   CO2 25   19 - 32 mEq/L   Glucose, Bld 103 (*) 70 - 99 mg/dL   BUN 7  6 - 23 mg/dL   Creatinine, Ser 7.82  0.50 - 1.35 mg/dL   Calcium 9.8  8.4 - 95.6 mg/dL   Total Protein 7.5  6.0 - 8.3 g/dL   Albumin 4.2  3.5 - 5.2 g/dL   AST 20  0 - 37 U/L   ALT 11  0 - 53 U/L   Alkaline Phosphatase 95  39 - 117 U/L   Total Bilirubin 0.5  0.3 - 1.2 mg/dL   GFR calc non Af Amer >90  >  90 mL/min   GFR calc Af Amer >90  >90 mL/min  TROPONIN I     Status: None   Collection Time    05/10/12  6:46 PM      Result Value Range   Troponin I <0.30  <0.30 ng/mL  POCT I-STAT, CHEM 8     Status: Abnormal   Collection Time    05/10/12  7:11 PM      Result Value Range   Sodium 143  135 - 145 mEq/L   Potassium 3.6  3.5 - 5.1 mEq/L   Chloride 105  96 - 112 mEq/L   BUN 5 (*) 6 - 23 mg/dL   Creatinine, Ser 9.81  0.50 - 1.35 mg/dL   Glucose, Bld 191 (*) 70 - 99 mg/dL   Calcium, Ion 4.78  2.95 - 1.30 mmol/L   TCO2 24  0 - 100 mmol/L   Hemoglobin 17.3 (*) 13.0 - 17.0 g/dL   HCT 62.1  30.8 - 65.7 %  GLUCOSE, CAPILLARY     Status: Abnormal   Collection Time    05/10/12  7:39 PM      Result Value Range   Glucose-Capillary 100 (*) 70 - 99 mg/dL  URINE RAPID DRUG SCREEN (HOSP PERFORMED)     Status: None   Collection Time    05/10/12  8:32 PM      Result Value Range   Opiates NONE DETECTED  NONE DETECTED   Cocaine NONE DETECTED  NONE DETECTED   Benzodiazepines NONE DETECTED  NONE DETECTED   Amphetamines NONE DETECTED  NONE DETECTED   Tetrahydrocannabinol NONE DETECTED  NONE DETECTED   Barbiturates NONE DETECTED  NONE DETECTED  URINALYSIS, ROUTINE W REFLEX MICROSCOPIC     Status: None   Collection Time    05/10/12  8:32 PM      Result Value Range   Color, Urine YELLOW  YELLOW   APPearance CLEAR  CLEAR   Specific Gravity, Urine 1.012  1.005 - 1.030   pH 7.5  5.0 - 8.0   Glucose, UA NEGATIVE  NEGATIVE mg/dL   Hgb urine dipstick NEGATIVE  NEGATIVE   Bilirubin Urine NEGATIVE  NEGATIVE   Ketones, ur NEGATIVE   NEGATIVE mg/dL   Protein, ur NEGATIVE  NEGATIVE mg/dL   Urobilinogen, UA 1.0  0.0 - 1.0 mg/dL   Nitrite NEGATIVE  NEGATIVE   Leukocytes, UA NEGATIVE  NEGATIVE  GLUCOSE, CAPILLARY     Status: None   Collection Time    05/11/12  1:35 AM      Result Value Range   Glucose-Capillary 89  70 - 99 mg/dL   Comment 1 Documented in Chart     Comment 2 Notify RN    LIPID PANEL     Status: None   Collection Time    05/11/12  4:45 AM      Result Value Range   Cholesterol 143  0 - 200 mg/dL   Triglycerides 47  <846 mg/dL   HDL 41  >96 mg/dL   Total CHOL/HDL Ratio 3.5     VLDL 9  0 - 40 mg/dL   LDL Cholesterol 93  0 - 99 mg/dL  GLUCOSE, CAPILLARY     Status: None   Collection Time    05/11/12  6:28 AM      Result Value Range   Glucose-Capillary 81  70 - 99 mg/dL   Comment 1 Documented in Chart     Comment 2 Notify RN  Dg Chest 2 View  05/10/2012  *RADIOLOGY REPORT*  Clinical Data: Stroke symptoms  CHEST - 2 VIEW  Comparison: None.  Findings: Cardiomediastinal silhouette is unremarkable.  No acute infiltrate or pleural effusion.  No pulmonary edema.  Mild thoracic spine osteopenia.  IMPRESSION: No active disease.   Original Report Authenticated By: Natasha Mead, M.D.    Ct Head Wo Contrast  05/10/2012  *RADIOLOGY REPORT*  Clinical Data: 72 year old male who awoke with right-sided weakness yesterday.  Hypertension.  CT HEAD WITHOUT CONTRAST  Technique:  Contiguous axial images were obtained from the base of the skull through the vertex without contrast.  Comparison: None.  Findings: Scattered mild paranasal sinus mucosal thickening. Mastoids are clear. Visualized orbit soft tissues are within normal limits.  Visualized scalp soft tissues are within normal limits. No acute osseous abnormality identified.  Mild Calcified atherosclerosis at the skull base.  Chronic-appearing lacuna/hypodensity right pons.  Chronic-appearing posterior left corona radiata lacunar infarct.  Mildly more indistinct central  left corona radiata hypodensity. No midline shift, mass effect, or evidence of mass lesion.  No acute intracranial hemorrhage identified.  No cortical encephalomalacia. No suspicious intracranial vascular hyperdensity. No evidence of cortically based acute infarction identified.  IMPRESSION: Chronic small vessel ischemia.  Age indeterminate area of involvement in the left corona radiata (image 16).  Otherwise no evidence of acute intracranial abnormality.   Original Report Authenticated By: Erskine Speed, M.D.    Mr Angiogram Head Wo Contrast  05/10/2012  *RADIOLOGY REPORT*  Clinical Data:  Stroke.  Right-sided weakness  MRI HEAD WITHOUT CONTRAST MRA HEAD WITHOUT CONTRAST  Technique:  Multiplanar, multiecho pulse sequences of the brain and surrounding structures were obtained without intravenous contrast. Angiographic images of the head were obtained using MRA technique without contrast.  Comparison:  CT head 05/10/2012  MRI HEAD  Findings:  Acute infarct in the centrum semiovale on the left measuring approximately 15 mm in diameter.  No other acute infarct identified.  Chronic lacunar infarctions in the pons bilaterally.  Chronic infarct in the right thalamus.  Mild chronic microvascular ischemic change in the cerebral white matter.  Small chronic infarct right cerebellum.  Negative for hemorrhage or mass.  Ventricle size is normal.  No shift to the midline structures.  IMPRESSION: Acute infarct in the left centrum semiovale.  Moderate chronic ischemic changes.  MRA HEAD  Findings: Both vertebral arteries are patent to the basilar.  Left vertebral artery is dominant.  The basilar is patent.  Posterior cerebral artery is patent bilaterally.  Posterior cerebral arteries are patent bilaterally.  Right internal carotid artery is patent.  Right anterior and middle cerebral arteries are patent bilaterally.  Mild stenosis right A1 segment.  The left internal carotid artery is widely patent.  The left anterior and middle  cerebral arteries are widely patent.  Negative for cerebral aneurysm.  IMPRESSION: No significant intracranial stenosis.   Original Report Authenticated By: Janeece Riggers, M.D.    Mr Brain Wo Contrast  05/10/2012  *RADIOLOGY REPORT*  Clinical Data:  Stroke.  Right-sided weakness  MRI HEAD WITHOUT CONTRAST MRA HEAD WITHOUT CONTRAST  Technique:  Multiplanar, multiecho pulse sequences of the brain and surrounding structures were obtained without intravenous contrast. Angiographic images of the head were obtained using MRA technique without contrast.  Comparison:  CT head 05/10/2012  MRI HEAD  Findings:  Acute infarct in the centrum semiovale on the left measuring approximately 15 mm in diameter.  No other acute infarct identified.  Chronic lacunar infarctions in  the pons bilaterally.  Chronic infarct in the right thalamus.  Mild chronic microvascular ischemic change in the cerebral white matter.  Small chronic infarct right cerebellum.  Negative for hemorrhage or mass.  Ventricle size is normal.  No shift to the midline structures.  IMPRESSION: Acute infarct in the left centrum semiovale.  Moderate chronic ischemic changes.  MRA HEAD  Findings: Both vertebral arteries are patent to the basilar.  Left vertebral artery is dominant.  The basilar is patent.  Posterior cerebral artery is patent bilaterally.  Posterior cerebral arteries are patent bilaterally.  Right internal carotid artery is patent.  Right anterior and middle cerebral arteries are patent bilaterally.  Mild stenosis right A1 segment.  The left internal carotid artery is widely patent.  The left anterior and middle cerebral arteries are widely patent.  Negative for cerebral aneurysm.  IMPRESSION: No significant intracranial stenosis.   Original Report Authenticated By: Janeece Riggers, M.D.     Assessment/Plan: Diagnosis: left centrum semiovale infarct 1. Does the need for close, 24 hr/day medical supervision in concert with the patient's rehab needs make  it unreasonable for this patient to be served in a less intensive setting? Yes 2. Co-Morbidities requiring supervision/potential complications: sz disorder 3. Due to bladder management, bowel management, safety, skin/wound care, disease management, medication administration, pain management and patient education, does the patient require 24 hr/day rehab nursing? Yes 4. Does the patient require coordinated care of a physician, rehab nurse, PT (1-2 hrs/day, 5 days/week), OT (1-2 hrs/day, 5 days/week) and SLP (1-2 hrs/day, 5 days/week) to address physical and functional deficits in the context of the above medical diagnosis(es)? Yes Addressing deficits in the following areas: balance, endurance, locomotion, strength, transferring, bowel/bladder control, bathing, dressing, feeding, grooming, toileting, speech, swallowing and psychosocial support 5. Can the patient actively participate in an intensive therapy program of at least 3 hrs of therapy per day at least 5 days per week? Yes 6. The potential for patient to make measurable gains while on inpatient rehab is excellent 7. Anticipated functional outcomes upon discharge from inpatient rehab are mod I with PT, mod I with OT, mod I with SLP. 8. Estimated rehab length of stay to reach the above functional goals is: 7-9 days 9. Does the patient have adequate social supports to accommodate these discharge functional goals? Yes 10. Anticipated D/C setting: Home 11. Anticipated post D/C treatments: HH therapy 12. Overall Rehab/Functional Prognosis: excellent  RECOMMENDATIONS: This patient's condition is appropriate for continued rehabilitative care in the following setting: CIR Patient has agreed to participate in recommended program. Yes Note that insurance prior authorization may be required for reimbursement for recommended care.  Comment:Rehab RN to follow up.   Ranelle Oyster, MD, [place] [facility]     05/11/2012

## 2012-05-11 NOTE — Progress Notes (Signed)
SLP Cancellation Note  Patient Details Name: Stephane Niemann MRN: [id number] DOB: 1940-11-10   Cancelled treatment:       Reason Eval/Treat Not Completed: Patient at procedure or test/unavailable . Attempted swallow eval x2 this am. Pt with PT, then going to MRI. Will f/u.   Harlon Ditty, [place] CCC-SLP [phone]  Claudine Mouton 05/11/2012, 10:49 AM

## 2012-05-11 NOTE — Progress Notes (Signed)
[facility] PROGRESS NOTE  Kyle Dudley ZOX:[id number] DOB: 1940/03/22 DOA: 05/10/2012 PCP: Default, Provider, MD  Assessment/Plan: 1. Acute infarct left Centrum semioval: MRI positive for stroke. LDL 93. HB-A1c pending. Aspirin per rectum, oral when pass swallow evaluation. ECHO, doppler pending. MRA no stenosis. Smoking cessation counseling.  2. HTN :permisive HTN in setting acute stroke. Marland Kitchen He will need to be started on oral medication for HTN prior to discharge.  3. Seizure: Continue with Keppra IV until pass swallow evaluation.   Code Status: Full Family Communication: Care discussed with patient.  Disposition Plan: to be determine, PT evaluation pending.    Consultants:  Neurologist.   Procedures:  ECHO: P  Carotid doppler : Pending.   Antibiotics:  none  HPI/Subjective: Feeling ok. No complaints, feels improvement of right arm weakness.   Objective: Filed Vitals:   05/10/12 2330 05/11/12 0200 05/11/12 0400 05/11/12 0600  BP: 133/76 176/96 142/72 151/82  Pulse: 65 71 78 84  Temp:  97.3 F (36.3 C) 97.8 F (36.6 C) 97.9 F (36.6 C)  TempSrc:  Oral Oral Oral  Resp: 23 20 20 20   Height:  5\' 4"  (1.626 m)    Weight:  48 kg (105 lb 13.1 oz)    SpO2: 99% 98% 100% 98%   No intake or output data in the 24 hours ending 05/11/12 1012 Filed Weights   05/11/12 0200  Weight: 48 kg (105 lb 13.1 oz)    Exam:   General:  No distress.  Cardiovascular: S 1, S 2 RRR  Respiratory: CTA  Abdomen: BS present, soft, NT  Musculoskeletal: no edema.   Neuro right hand grip weak, left LE 4/5.   Data Reviewed: Basic Metabolic Panel:  Recent Labs Lab 05/10/12 1845 05/10/12 1911  NA 141 143  K 3.7 3.6  CL 104 105  CO2 25  --   GLUCOSE 103* 104*  BUN 7 5*  CREATININE 0.65 0.60  CALCIUM 9.8  --    Liver Function Tests:  Recent Labs Lab 05/10/12 1845  AST 20  ALT 11  ALKPHOS 95  BILITOT 0.5  PROT 7.5  ALBUMIN 4.2   No results found for this basename:  LIPASE, AMYLASE,  in the last 168 hours No results found for this basename: AMMONIA,  in the last 168 hours CBC:  Recent Labs Lab 05/10/12 1845 05/10/12 1911  WBC 8.7  --   NEUTROABS 5.2  --   HGB 16.8 17.3*  HCT 46.3 51.0  MCV 89.9  --   PLT 247  --    Cardiac Enzymes:  Recent Labs Lab 05/10/12 1846  TROPONINI <0.30   BNP (last 3 results) No results found for this basename: PROBNP,  in the last 8760 hours CBG:  Recent Labs Lab 05/10/12 1939 05/11/12 0135 05/11/12 0628  GLUCAP 100* 89 81    No results found for this or any previous visit (from the past 240 hour(s)).   Studies: Dg Chest 2 View  05/10/2012  *RADIOLOGY REPORT*  Clinical Data: Stroke symptoms  CHEST - 2 VIEW  Comparison: None.  Findings: Cardiomediastinal silhouette is unremarkable.  No acute infiltrate or pleural effusion.  No pulmonary edema.  Mild thoracic spine osteopenia.  IMPRESSION: No active disease.   Original Report Authenticated By: Natasha Mead, M.D.    Ct Head Wo Contrast  05/10/2012  *RADIOLOGY REPORT*  Clinical Data: 72 year old male who awoke with right-sided weakness yesterday.  Hypertension.  CT HEAD WITHOUT CONTRAST  Technique:  Contiguous axial images were  obtained from the base of the skull through the vertex without contrast.  Comparison: None.  Findings: Scattered mild paranasal sinus mucosal thickening. Mastoids are clear. Visualized orbit soft tissues are within normal limits.  Visualized scalp soft tissues are within normal limits. No acute osseous abnormality identified.  Mild Calcified atherosclerosis at the skull base.  Chronic-appearing lacuna/hypodensity right pons.  Chronic-appearing posterior left corona radiata lacunar infarct.  Mildly more indistinct central left corona radiata hypodensity. No midline shift, mass effect, or evidence of mass lesion.  No acute intracranial hemorrhage identified.  No cortical encephalomalacia. No suspicious intracranial vascular hyperdensity. No  evidence of cortically based acute infarction identified.  IMPRESSION: Chronic small vessel ischemia.  Age indeterminate area of involvement in the left corona radiata (image 16).  Otherwise no evidence of acute intracranial abnormality.   Original Report Authenticated By: Erskine Speed, M.D.    Mr Angiogram Head Wo Contrast  05/10/2012  *RADIOLOGY REPORT*  Clinical Data:  Stroke.  Right-sided weakness  MRI HEAD WITHOUT CONTRAST MRA HEAD WITHOUT CONTRAST  Technique:  Multiplanar, multiecho pulse sequences of the brain and surrounding structures were obtained without intravenous contrast. Angiographic images of the head were obtained using MRA technique without contrast.  Comparison:  CT head 05/10/2012  MRI HEAD  Findings:  Acute infarct in the centrum semiovale on the left measuring approximately 15 mm in diameter.  No other acute infarct identified.  Chronic lacunar infarctions in the pons bilaterally.  Chronic infarct in the right thalamus.  Mild chronic microvascular ischemic change in the cerebral white matter.  Small chronic infarct right cerebellum.  Negative for hemorrhage or mass.  Ventricle size is normal.  No shift to the midline structures.  IMPRESSION: Acute infarct in the left centrum semiovale.  Moderate chronic ischemic changes.  MRA HEAD  Findings: Both vertebral arteries are patent to the basilar.  Left vertebral artery is dominant.  The basilar is patent.  Posterior cerebral artery is patent bilaterally.  Posterior cerebral arteries are patent bilaterally.  Right internal carotid artery is patent.  Right anterior and middle cerebral arteries are patent bilaterally.  Mild stenosis right A1 segment.  The left internal carotid artery is widely patent.  The left anterior and middle cerebral arteries are widely patent.  Negative for cerebral aneurysm.  IMPRESSION: No significant intracranial stenosis.   Original Report Authenticated By: Janeece Riggers, M.D.    Mr Brain Wo Contrast  05/10/2012   *RADIOLOGY REPORT*  Clinical Data:  Stroke.  Right-sided weakness  MRI HEAD WITHOUT CONTRAST MRA HEAD WITHOUT CONTRAST  Technique:  Multiplanar, multiecho pulse sequences of the brain and surrounding structures were obtained without intravenous contrast. Angiographic images of the head were obtained using MRA technique without contrast.  Comparison:  CT head 05/10/2012  MRI HEAD  Findings:  Acute infarct in the centrum semiovale on the left measuring approximately 15 mm in diameter.  No other acute infarct identified.  Chronic lacunar infarctions in the pons bilaterally.  Chronic infarct in the right thalamus.  Mild chronic microvascular ischemic change in the cerebral white matter.  Small chronic infarct right cerebellum.  Negative for hemorrhage or mass.  Ventricle size is normal.  No shift to the midline structures.  IMPRESSION: Acute infarct in the left centrum semiovale.  Moderate chronic ischemic changes.  MRA HEAD  Findings: Both vertebral arteries are patent to the basilar.  Left vertebral artery is dominant.  The basilar is patent.  Posterior cerebral artery is patent bilaterally.  Posterior cerebral arteries are  patent bilaterally.  Right internal carotid artery is patent.  Right anterior and middle cerebral arteries are patent bilaterally.  Mild stenosis right A1 segment.  The left internal carotid artery is widely patent.  The left anterior and middle cerebral arteries are widely patent.  Negative for cerebral aneurysm.  IMPRESSION: No significant intracranial stenosis.   Original Report Authenticated By: Janeece Riggers, M.D.     Scheduled Meds: . sodium chloride   Intravenous STAT  . aspirin  81 mg Oral Daily  . aspirin  300 mg Rectal Once  . heparin  5,000 Units Subcutaneous Q8H  . levETIRAcetam  1,000 mg Intravenous Q12H   Continuous Infusions: . sodium chloride 75 mL/hr at 05/11/12 0022    Principal Problem:   Acute ischemic left MCA stroke Active Problems:   Seizure  disorder    Time spent: 35 minutes.     REGALADO,BELKYS  [facility] Pager [phone]. If 7PM-7AM, please contact night-coverage at [email or web address], password [facility] 05/11/2012, 10:12 AM  LOS: 1 day

## 2012-05-12 DIAGNOSIS — I6330 Cerebral infarction due to thrombosis of unspecified cerebral artery: Secondary | ICD-10-CM

## 2012-05-12 LAB — GLUCOSE, CAPILLARY
Glucose-Capillary: 115 mg/dL — ABNORMAL HIGH (ref 70–99)
Glucose-Capillary: 85 mg/dL (ref 70–99)

## 2012-05-12 MED ORDER — ENSURE COMPLETE PO LIQD
237.0000 mL | Freq: Two times a day (BID) | ORAL | Status: DC
  Administered 2012-05-12 – 2012-05-13 (×3): 237 mL via ORAL

## 2012-05-12 MED ORDER — LEVETIRACETAM 500 MG PO TABS
1000.0000 mg | ORAL_TABLET | Freq: Two times a day (BID) | ORAL | Status: DC
  Administered 2012-05-12 – 2012-05-13 (×2): 1000 mg via ORAL
  Filled 2012-05-12 (×3): qty 2

## 2012-05-12 MED ORDER — SODIUM CHLORIDE 0.9 % IV SOLN: 1000.0000 mg | Freq: Two times a day (BID) | INTRAVENOUS | Status: DC

## 2012-05-12 NOTE — Evaluation (Signed)
Speech Language Pathology Evaluation Patient Details Name: Kyle Dudley MRN: [id number] DOB: 07-29-1940 Today's Date: 05/12/2012 Time: 4098-1191 SLP Time Calculation (min): 43 min  Problem List:  Patient Active Problem List  Diagnosis  . Acute ischemic left MCA stroke  . Seizure disorder   Past Medical History:  Past Medical History  Diagnosis Date  . Seizures    Past Surgical History: History reviewed. No pertinent past surgical history. HPI:   72 year old male admitted 05/10/12 with right sided weakness. MRI indicated CVA in the left centrum semiovale.  SLE requested to assess com-cog status.   Assessment / Plan / Recommendation Clinical Impression  Pt alert and cooperative with assessment. Pt indicated multiple times that his primary concern is rehabilitation of his right side weakness. Pt required further explanation re: rationale for SLP evaluation to assess cognitive/linguistic deficits.  [facility] Cognitive Assessment) administered. Pt overall score 13/30 (N=>25/30), with 1 point granted due to <12th grade education. Difficulty noted in all areas - visuospatial/executive, naming, immediate and short term recall, high levels of attention, Verbal fluency, and reasoning.  Pt Ox4.  Pt reports living alone. Based on today's scores of comm-cog status, 24 hour supervision is recommended with continued therapy services after DC.      SLP Assessment  Patient needs continued Speech Lanaguage Pathology Services    Follow Up Recommendations  24 hour supervision/assistance    Frequency and Duration min 1 x/week  2 weeks   Pertinent Vitals/Pain No pain reported.   SLP Goals  SLP Goals Potential to Achieve Goals: Good Potential Considerations: Cooperation/participation level;Previous level of function;Family/community support Progress/Goals/Alternative treatment plan discussed with pt/caregiver and they: Agree SLP Goal #1: Pt will verbalize 2 cognitive deficits and their functional  impact with 80% success, given mod assist SLP Goal #2: Pt will recall functional information using compensatory strategies with 80% success, given mod assist.  SLP Evaluation Prior Functioning  Cognitive/Linguistic Baseline: Information not available Education: 9th grade   Cognition  Overall Cognitive Status: Impaired/Different from baseline Arousal/Alertness: Awake/alert Orientation Level: Oriented X4 Attention: Focused;Sustained Focused Attention: Appears intact Sustained Attention: Impaired Sustained Attention Impairment: Verbal basic;Verbal complex;Functional basic Selective Attention: Impaired Selective Attention Impairment: Verbal basic;Functional basic;Verbal complex Memory: Impaired Memory Impairment: Storage deficit;Retrieval deficit;Decreased recall of new information;Decreased short term memory Decreased Short Term Memory: Verbal basic;Functional basic Awareness: Impaired Awareness Impairment: Intellectual impairment;Emergent impairment;Anticipatory impairment Problem Solving: Impaired Problem Solving Impairment: Verbal basic;Functional basic Executive Function: Reasoning Reasoning: Impaired Behaviors: Poor frustration tolerance;Other (comment) (All Pt wants is for RUE to function better) Safety/Judgment: Impaired    Comprehension  Auditory Comprehension Overall Auditory Comprehension: Appears within functional limits for tasks assessed Yes/No Questions: Within Functional Limits Commands: Within Functional Limits Conversation: Simple Interfering Components: Working memory;Processing speed EffectiveTechniques: Extra processing time Reading Comprehension Reading Status: Not tested    Expression Expression Primary Mode of Expression: Verbal Verbal Expression Overall Verbal Expression: Appears within functional limits for tasks assessed Initiation: No impairment Level of Generative/Spontaneous Verbalization: Conversation Repetition: No impairment Naming: No  impairment Pragmatics: No impairment Non-Verbal Means of Communication: Not applicable Written Expression Dominant Hand: Right Written Expression: Exceptions to WFL (Pt is right hand dominant)   Oral / Motor Motor Speech Overall Motor Speech: Appears within functional limits for tasks assessed Respiration: Within functional limits Phonation: Normal Resonance: Within functional limits Articulation: Within functional limitis Intelligibility: Intelligible Motor Planning: Witnin functional limits Motor Speech Errors: Not applicable   Kyle Dudley [facility], CCC-SLP [phone]  [facility] [place] 05/12/2012, 11:45 AM

## 2012-05-12 NOTE — Progress Notes (Signed)
Physical Therapy Treatment Patient Details Name: Kyle Dudley MRN: [id number] DOB: [birth year] Today's Date: 05/12/2012 Time: 1191-4782 PT Time Calculation (min): 22 min  PT Assessment / Plan / Recommendation Comments on Treatment Session  72 year old male admitted 05/10/12 with right sided weakness. MRI indicated CVA in the left centrum semiovale. Progressing nicely today. Very motivated.     Follow Up Recommendations  CIR     Does the patient have the potential to tolerate intense rehabilitation     Barriers to Discharge        Equipment Recommendations  None recommended by PT    Recommendations for Other Services    Frequency Min 4X/week   Plan Discharge plan remains appropriate;Frequency remains appropriate    Precautions / Restrictions Precautions Precautions: Fall   Pertinent Vitals/Pain Denies pain    Mobility  Bed Mobility Bed Mobility: Not assessed Supine to Sit: 5: Supervision Details for Bed Mobility Assistance: cues for efficiency and use of RUE to assist during transfer Transfers Transfers: Sit to Stand;Stand to Sit Sit to Stand: 4: Min assist Stand to Sit: 4: Min assist Details for Transfer Assistance: hand over hand to engage RUE in sit<>stand transfer; sit<>stand x5 with min facilitation for equal 50/50 WB and coordinated trunk and lower extremities during transfer especially eccentrically Ambulation/Gait Ambulation/Gait Assistance: 4: Min assist Ambulation Distance (Feet): 120 Feet Assistive device: None Ambulation/Gait Assistance Details: improved stability when he maintains selective attention on straight path ambulation however when distracted and trying to alternate/perform head turns he loses his balance and drifts to the right needing assist to correct, slower reaction time noted; cues for bigger step with improved control during swing of RLE (does drag at times) Gait Pattern: Step-through pattern;Decreased step length - right;Decreased weight shift to  right Gait velocity: decreased General Gait Details: decreased step height on the right      PT Goals Acute Rehab PT Goals PT Goal: Supine/Side to Sit - Progress: Progressing toward goal PT Goal: Sit to Supine/Side - Progress: Progressing toward goal PT Goal: Sit to Stand - Progress: Progressing toward goal PT Goal: Stand to Sit - Progress: Progressing toward goal PT Transfer Goal: Bed to Chair/Chair to Bed - Progress: Progressing toward goal PT Goal: Stand - Progress: Progressing toward goal PT Goal: Ambulate - Progress: Progressing toward goal PT Goal: Perform Home Exercise Program - Progress: Progressing toward goal  Visit Information  Last PT Received On: 05/12/12 Assistance Needed: +1    Subjective Data  Subjective: They said you would be here earlier.    Cognition  Cognition Arousal/Alertness: Awake/alert Behavior During Therapy: WFL for tasks assessed/performed Overall Cognitive Status: Impaired/Different from baseline (pt became tearful when about girlfriend that died  last year) Area of Impairment: Problem solving;Following commands Following Commands: Follows one step commands consistently;Follows multi-step commands consistently Problem Solving: Requires verbal cues Difficulty recalling (?ability to articulate) what we did yesterday during PT session    Balance  Balance Balance Assessed: Yes Static Standing Balance Static Standing - Balance Support: Left upper extremity supported;No upper extremity supported Static Standing - Level of Assistance: 4: Min assist;5: Stand by assistance Dynamic Standing Balance Dynamic Standing - Balance Support: No upper extremity supported Dynamic Standing - Level of Assistance: 4: Min assist Dynamic Standing - Balance Activities: Lateral lean/weight shifting Dynamic Standing - Comments: 10 reciprocal toe taps up on to wash basin with emphasis on trunk and movement control; shaky with weight shift to the right requiring minA for  stability improving with engagement of trunk  and cues to weight bear and extend fully through RLE High Level Balance High Level Balance Comments: difficulty with speed changes, pt avoidant of increased speed  End of Session PT - End of Session Equipment Utilized During Treatment: Gait belt Activity Tolerance: Patient tolerated treatment well Patient left: in chair;with call bell/phone within reach (OT in the room) Nurse Communication: Mobility status   GP     [facility] HELEN 05/12/2012, 3:49 PM

## 2012-05-12 NOTE — Progress Notes (Signed)
Stroke Team Progress Note  HISTORY Kyle Dudley is an 72 y.o. male, right handed with a past medical history significant for an isolated seizure four years ago for which he takes keppra 2,000 mg daily, brought to [facility] by his family due to right sided weakness and dragging ofthe right leg. Kyle Dudley stated that he never had similar symptoms before but this past Monday 05/09/2012 he woke up and noticed that he was not able to grab with his right hand and the right leg was weak. He did not experienced face weakness, headache, vertigo, double vision, difficulty swallowing, slurred speech, language or vision impairment. Stated that he has been dragging the right leg ever since. CT brain revealed age indeterminate hypodensity in the left CR which was confirmed to be an acute lacunar infarct right CR on MRI-DWI. MRA brain unimpressive. Patient was not a TPA candidate secondary to delay in arrival. He was admitted for further evaluation and treatment.  SUBJECTIVE Patient up in chair at bedside. No family is at the bedside.  Overall he feels his condition is stable.   OBJECTIVE Most recent Vital Signs: Filed Vitals:   05/11/12 2156 05/12/12 0208 05/12/12 0630 05/12/12 1055  BP: 132/70 148/87 139/85 143/74  Pulse: 64 67 64 70  Temp: 98.1 F (36.7 C) 98 F (36.7 C) 97.6 F (36.4 C) 97.6 F (36.4 C)  TempSrc: Oral Oral Oral Oral  Resp: 20 20 20 18   Height:      Weight:      SpO2: 99% 100% 98% 99%   CBG (last 3)   Recent Labs  05/11/12 2202 05/12/12 0633 05/12/12 1132  GLUCAP 98 75 114*    IV Fluid Intake:      MEDICATIONS  . aspirin  81 mg Oral Daily  . heparin  5,000 Units Subcutaneous Q8H  . levETIRAcetam  1,000 mg Intravenous Q12H   PRN:    Diet:  General thin liquids Activity:  OOB DVT Prophylaxis:  Heparin 5000 units sq tid  CLINICALLY SIGNIFICANT STUDIES Basic Metabolic Panel:   Recent Labs Lab 05/10/12 1845 05/10/12 1911  NA 141 143  K 3.7 3.6  CL 104 105  CO2 25  --    GLUCOSE 103* 104*  BUN 7 5*  CREATININE 0.65 0.60  CALCIUM 9.8  --    Liver Function Tests:   Recent Labs Lab 05/10/12 1845  AST 20  ALT 11  ALKPHOS 95  BILITOT 0.5  PROT 7.5  ALBUMIN 4.2   CBC:   Recent Labs Lab 05/10/12 1845 05/10/12 1911  WBC 8.7  --   NEUTROABS 5.2  --   HGB 16.8 17.3*  HCT 46.3 51.0  MCV 89.9  --   PLT 247  --    Coagulation:   Recent Labs Lab 05/10/12 1845  LABPROT 13.1  INR 1.00   Cardiac Enzymes:   Recent Labs Lab 05/10/12 1846  TROPONINI <0.30   Urinalysis:   Recent Labs Lab 05/10/12 2032  COLORURINE YELLOW  LABSPEC 1.012  PHURINE 7.5  GLUCOSEU NEGATIVE  HGBUR NEGATIVE  BILIRUBINUR NEGATIVE  KETONESUR NEGATIVE  PROTEINUR NEGATIVE  UROBILINOGEN 1.0  NITRITE NEGATIVE  LEUKOCYTESUR NEGATIVE   Lipid Panel    Component Value Date/Time   CHOL 143 05/11/2012 0445   TRIG 47 05/11/2012 0445   HDL 41 05/11/2012 0445   CHOLHDL 3.5 05/11/2012 0445   VLDL 9 05/11/2012 0445   LDLCALC 93 05/11/2012 0445   HgbA1C  Lab Results  Component Value Date  HGBA1C 4.9 05/11/2012    Urine Drug Screen:     Component Value Date/Time   LABOPIA NONE DETECTED 05/10/2012 2032   COCAINSCRNUR NONE DETECTED 05/10/2012 2032   LABBENZ NONE DETECTED 05/10/2012 2032   AMPHETMU NONE DETECTED 05/10/2012 2032   THCU NONE DETECTED 05/10/2012 2032   LABBARB NONE DETECTED 05/10/2012 2032    Alcohol Level:   Recent Labs Lab 05/10/12 1845  ETH <11   CT of the brain  05/10/2012 chronic small vessel ischemia.  Age indeterminate area of involvement in the left corona radiata (image 16).  Otherwise no evidence of acute intracranial abnormality.    MRI of the brain  05/10/2012  Acute infarct in the left centrum semiovale.  Moderate chronic ischemic changes.  MRA of the brain  05/10/2012   No significant intracranial stenosis.   2D Echocardiogram  EF 55-65% with no source of embolus.   Carotid Doppler  No evidence of hemodynamically significant internal  carotid artery stenosis. Vertebral artery flow is antegrade.   CXR  05/10/2012   No active disease.  EKG   normal sinus rhythm  Therapy Recommendations CIR  Physical Exam  General: The patient is alert and cooperative at the time of the examination.  Skin: No significant peripheral edema is noted.   Neurologic Exam  Cranial nerves: Facial symmetry is present. Speech is slightly dysarthric. Extraocular movements are full. Visual fields are full.  Motor: The patient has good strength in all 4 extremities, but there is slight clumbsiness with the right hand. No drift is seen.  Coordination: The patient has good finger-nose-finger and heel-to-shin bilaterally, with the exception that there is mild dysmetria of the left arm with finger-nose finger.  Gait and station: The gait was not tested. No drift is seen.  Reflexes: Deep tendon reflexes are symmetric.      ASSESSMENT Kyle Dudley is a 73 y.o. male presenting with right sided weakness. Imaging confirms a left centrum semiovale infarct. Infarct felt to be thrombotic due to small vessel disease. On no antiplatelets prior to admission. Now on aspirin suppository for secondary stroke prevention. Patient with resultant right hemiparesis. Work up completed.   Hypertension  Hx seizures, on keppra PTA  Cigarette smoker  LDL 93  HgbA1c 4.9  Hospital day # 2  TREATMENT/PLAN  Continue rectal aspirin for secondary stroke prevention.  Rehab when bed available   Stroke team will sign off for now.   Annie Main, MSN, RN, ANVP-BC, ANP-BC, [facility] Pager: [phone] 05/12/2012 11:41 AM  I have personally obtained a history, examined the patient, evaluated imaging results, and formulated the assessment and plan of care. I agree with the above. Ledora Bottcher KEITH

## 2012-05-12 NOTE — Progress Notes (Signed)
Pt possible for CIR.  Will f/up tomorrow. [phone]

## 2012-05-12 NOTE — Progress Notes (Signed)
[facility] PROGRESS NOTE  Kyle Dudley NFA:[id number] DOB: 1940/10/02 DOA: 05/10/2012 PCP: Default, Provider, MD  Assessment/Plan: 1. Acute infarct left Centrum semioval: MRI positive for stroke. LDL 93. HB-A1c pending. Aspirin per rectum, oral when pass swallow evaluation. ECHO, doppler pending. MRA no stenosis. Smoking cessation counseling. Waiting for insurance for Inpatient Rehab admission.  2. HTN :permisive HTN in setting acute stroke. Monitor.  3. Seizure: change  Keppra to oral.   Code Status: Full Family Communication: Care discussed with patient.  Disposition Plan: to be determine, PT evaluation pending.    Consultants:  Neurologist.   Procedures:  ECHO: P  Carotid doppler : Pending.   Antibiotics:  none  HPI/Subjective: Feeling ok. No complaints, feels improvement of right arm weakness.   Objective: Filed Vitals:   05/11/12 2156 05/12/12 0208 05/12/12 0630 05/12/12 1055  BP: 132/70 148/87 139/85 143/74  Pulse: 64 67 64 70  Temp: 98.1 F (36.7 C) 98 F (36.7 C) 97.6 F (36.4 C) 97.6 F (36.4 C)  TempSrc: Oral Oral Oral Oral  Resp: 20 20 20 18   Height:      Weight:      SpO2: 99% 100% 98% 99%   No intake or output data in the 24 hours ending 05/12/12 1705 Filed Weights   05/11/12 0200  Weight: 48 kg (105 lb 13.1 oz)    Exam:   General:  No distress.  Cardiovascular: S 1, S 2 RRR  Respiratory: CTA  Abdomen: BS present, soft, NT  Musculoskeletal: no edema.   Neuro right hand grip weak, left LE 4/5.   Data Reviewed: Basic Metabolic Panel:  Recent Labs Lab 05/10/12 1845 05/10/12 1911  NA 141 143  K 3.7 3.6  CL 104 105  CO2 25  --   GLUCOSE 103* 104*  BUN 7 5*  CREATININE 0.65 0.60  CALCIUM 9.8  --    Liver Function Tests:  Recent Labs Lab 05/10/12 1845  AST 20  ALT 11  ALKPHOS 95  BILITOT 0.5  PROT 7.5  ALBUMIN 4.2   No results found for this basename: LIPASE, AMYLASE,  in the last 168 hours No results found for  this basename: AMMONIA,  in the last 168 hours CBC:  Recent Labs Lab 05/10/12 1845 05/10/12 1911  WBC 8.7  --   NEUTROABS 5.2  --   HGB 16.8 17.3*  HCT 46.3 51.0  MCV 89.9  --   PLT 247  --    Cardiac Enzymes:  Recent Labs Lab 05/10/12 1846  TROPONINI <0.30   BNP (last 3 results) No results found for this basename: PROBNP,  in the last 8760 hours CBG:  Recent Labs Lab 05/11/12 1655 05/11/12 2202 05/12/12 0633 05/12/12 1132 05/12/12 1626  GLUCAP 135* 98 75 114* 115*    Recent Results (from the past 240 hour(s))  URINE CULTURE     Status: None   Collection Time    05/10/12  8:32 PM      Result Value Range Status   Specimen Description URINE, CLEAN CATCH   Final   Special Requests NONE   Final   Culture  Setup Time 05/10/2012 21:06   Final   Colony Count NO GROWTH   Final   Culture NO GROWTH   Final   Report Status 05/11/2012 FINAL   Final     Studies: Dg Chest 2 View  05/10/2012  *RADIOLOGY REPORT*  Clinical Data: Stroke symptoms  CHEST - 2 VIEW  Comparison: None.  Findings: Cardiomediastinal  silhouette is unremarkable.  No acute infiltrate or pleural effusion.  No pulmonary edema.  Mild thoracic spine osteopenia.  IMPRESSION: No active disease.   Original Report Authenticated By: Natasha Mead, M.D.    Ct Head Wo Contrast  05/10/2012  *RADIOLOGY REPORT*  Clinical Data: 72 year old male who awoke with right-sided weakness yesterday.  Hypertension.  CT HEAD WITHOUT CONTRAST  Technique:  Contiguous axial images were obtained from the base of the skull through the vertex without contrast.  Comparison: None.  Findings: Scattered mild paranasal sinus mucosal thickening. Mastoids are clear. Visualized orbit soft tissues are within normal limits.  Visualized scalp soft tissues are within normal limits. No acute osseous abnormality identified.  Mild Calcified atherosclerosis at the skull base.  Chronic-appearing lacuna/hypodensity right pons.  Chronic-appearing posterior left  corona radiata lacunar infarct.  Mildly more indistinct central left corona radiata hypodensity. No midline shift, mass effect, or evidence of mass lesion.  No acute intracranial hemorrhage identified.  No cortical encephalomalacia. No suspicious intracranial vascular hyperdensity. No evidence of cortically based acute infarction identified.  IMPRESSION: Chronic small vessel ischemia.  Age indeterminate area of involvement in the left corona radiata (image 16).  Otherwise no evidence of acute intracranial abnormality.   Original Report Authenticated By: Erskine Speed, M.D.    Mr Angiogram Head Wo Contrast  05/10/2012  *RADIOLOGY REPORT*  Clinical Data:  Stroke.  Right-sided weakness  MRI HEAD WITHOUT CONTRAST MRA HEAD WITHOUT CONTRAST  Technique:  Multiplanar, multiecho pulse sequences of the brain and surrounding structures were obtained without intravenous contrast. Angiographic images of the head were obtained using MRA technique without contrast.  Comparison:  CT head 05/10/2012  MRI HEAD  Findings:  Acute infarct in the centrum semiovale on the left measuring approximately 15 mm in diameter.  No other acute infarct identified.  Chronic lacunar infarctions in the pons bilaterally.  Chronic infarct in the right thalamus.  Mild chronic microvascular ischemic change in the cerebral white matter.  Small chronic infarct right cerebellum.  Negative for hemorrhage or mass.  Ventricle size is normal.  No shift to the midline structures.  IMPRESSION: Acute infarct in the left centrum semiovale.  Moderate chronic ischemic changes.  MRA HEAD  Findings: Both vertebral arteries are patent to the basilar.  Left vertebral artery is dominant.  The basilar is patent.  Posterior cerebral artery is patent bilaterally.  Posterior cerebral arteries are patent bilaterally.  Right internal carotid artery is patent.  Right anterior and middle cerebral arteries are patent bilaterally.  Mild stenosis right A1 segment.  The left internal  carotid artery is widely patent.  The left anterior and middle cerebral arteries are widely patent.  Negative for cerebral aneurysm.  IMPRESSION: No significant intracranial stenosis.   Original Report Authenticated By: Janeece Riggers, M.D.    Mr Brain Wo Contrast  05/10/2012  *RADIOLOGY REPORT*  Clinical Data:  Stroke.  Right-sided weakness  MRI HEAD WITHOUT CONTRAST MRA HEAD WITHOUT CONTRAST  Technique:  Multiplanar, multiecho pulse sequences of the brain and surrounding structures were obtained without intravenous contrast. Angiographic images of the head were obtained using MRA technique without contrast.  Comparison:  CT head 05/10/2012  MRI HEAD  Findings:  Acute infarct in the centrum semiovale on the left measuring approximately 15 mm in diameter.  No other acute infarct identified.  Chronic lacunar infarctions in the pons bilaterally.  Chronic infarct in the right thalamus.  Mild chronic microvascular ischemic change in the cerebral white matter.  Small chronic infarct  right cerebellum.  Negative for hemorrhage or mass.  Ventricle size is normal.  No shift to the midline structures.  IMPRESSION: Acute infarct in the left centrum semiovale.  Moderate chronic ischemic changes.  MRA HEAD  Findings: Both vertebral arteries are patent to the basilar.  Left vertebral artery is dominant.  The basilar is patent.  Posterior cerebral artery is patent bilaterally.  Posterior cerebral arteries are patent bilaterally.  Right internal carotid artery is patent.  Right anterior and middle cerebral arteries are patent bilaterally.  Mild stenosis right A1 segment.  The left internal carotid artery is widely patent.  The left anterior and middle cerebral arteries are widely patent.  Negative for cerebral aneurysm.  IMPRESSION: No significant intracranial stenosis.   Original Report Authenticated By: Janeece Riggers, M.D.     Scheduled Meds: . aspirin  81 mg Oral Daily  . feeding supplement  237 mL Oral BID BM  . heparin   5,000 Units Subcutaneous Q8H  . levETIRAcetam  1,000 mg Intravenous Q12H   Continuous Infusions:    Principal Problem:   Acute ischemic left MCA stroke Active Problems:   Seizure disorder    Time spent: 35 minutes.     Anmarie Fukushima  [facility] Pager [phone]. If 7PM-7AM, please contact night-coverage at [email or web address], password [facility] 05/12/2012, 5:05 PM  LOS: 2 days

## 2012-05-12 NOTE — Evaluation (Signed)
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 Pt would benefit from OT services to address these impairments to help restore PLOF    OT Assessment  Patient needs continued OT Services    Follow Up Recommendations  CIR    Barriers to Discharge Decreased caregiver support Pt at home alone  Equipment Recommendations   TBD   Recommendations for Other Services    Frequency  Min 2X/week    Precautions / Restrictions Precautions Precautions: Fall   Pertinent Vitals/Pain     ADL  Grooming: Wash/dry hands;Wash/dry face;Teeth care;Min guard;Minimal assistance Where Assessed - Grooming: Supported standing Upper Body Bathing: Simulated;Supervision/safety;Set up Lower Body Bathing: Simulated;Minimal assistance Upper Body Dressing: Performed;Supervision/safety;Set up Lower Body Dressing: Performed;Minimal assistance Toilet Transfer: Performed;Minimal assistance Toilet Transfer Method: Sit to stand Toilet Transfer Equipment: Regular height toilet Toileting - Clothing Manipulation and Hygiene: Min guard;Performed Where Assessed - Toileting Clothing Manipulation and Hygiene: Standing ADL Comments: pt demos diffulty with min A required for manipulating LB clothing and for oral hygiene with manipulating toiletry items due to R UE FMC/GMC deficits    OT Diagnosis: Generalized weakness  OT Problem List: Decreased strength;Decreased coordination;Impaired UE functional use;Decreased activity tolerance OT Treatment Interventions: Self-care/ADL training;Therapeutic exercise;Neuromuscular education;Therapeutic activities;Patient/family  education;DME and/or AE instruction   OT Goals Acute Rehab OT Goals OT Goal Formulation: With patient Time For Goal Achievement: 05/19/12 Potential to Achieve Goals: Good ADL Goals Pt Will Perform Grooming: with set-up;with supervision;Standing at sink ADL Goal: Grooming - Progress: Goal set today Pt Will Perform Lower Body Bathing: with set-up;with supervision ADL Goal: Lower Body Bathing - Progress: Goal set today Pt Will Perform Lower Body Dressing: with set-up;with supervision ADL Goal: Lower Body Dressing - Progress: Goal set today Pt Will Perform Toileting - Clothing Manipulation: with supervision ADL Goal: Toileting - Clothing Manipulation - Progress: Goal set today Arm Goals Pt Will Complete Theraband Exer: to increase strength;Right upper extremity;3 sets;15 reps Arm Goal: Theraband Exercises - Progress: Goal set today Pt Will Complete Theraputty Exer: to increase strength;Other (comment) (increase FMC) Arm Goal: Theraputty Exercises - Progress: Goal set today Additional Arm Goal #1: Pt will perform [facility] activities wihtout dropping 3/5 items to decrease difficulty with cutting foods and manipulating food items during meals Arm Goal: Additional Goal #1 - Progress: Goal set today  Visit Information  Last OT Received On: 05/12/12 Assistance Needed: +1    Subjective Data  Subjective: " I had trouble cutting my food at lunch " Patient Stated Goal: To return home   Prior Functioning     Home Living Lives With: Alone Available Help at Discharge: Family;Available PRN/intermittently Type of Home: Independent living facility Home Access: Level entry Home Layout: One level Bathroom Shower/Tub: Engineer, manufacturing systems: Standard Home Adaptive Equipment: None Prior Function Level of Independence: Independent Able to Take Stairs?: Yes Driving: Yes Vocation: Retired Musician: No difficulties Dominant Hand: Right         Vision/Perception  Vision - History Baseline Vision: Wears glasses for distance only Patient Visual Report: No change from baseline Perception Perception: Within Functional Limits   Cognition  Cognition Arousal/Alertness: Awake/alert Behavior During Therapy: WFL for tasks assessed/performed Overall Cognitive Status:  Impaired/Different from baseline (pt became tearful when about girlfriend that died  last year) Area of Impairment: Problem solving;Following commands Following Commands: Follows one step commands consistently;Follows multi-step commands consistently Problem Solving: Requires verbal cues    Extremity/Trunk Assessment Right Upper Extremity Assessment RUE ROM/Strength/Tone: Deficits RUE ROM/Strength/Tone Deficits: elbow, shoulder strength 3+/5, R hand/wrist 3/5  RUE Sensation: WFL - Light Touch RUE Coordination Deficits: decreased grip and grasp, FMC/GMC impaired. Difficulty picking up objects and cutting food, donning/doffing shoes Left Upper Extremity Assessment LUE ROM/Strength/Tone: WFL for tasks assessed LUE Sensation: WFL - Light Touch LUE Coordination: WFL - gross/fine motor;WFL - gross motor     Mobility Bed Mobility Bed Mobility: Not assessed Supine to Sit: 5: Supervision Details for Bed Mobility Assistance: cues for efficiency and use of RUE to assist during transfer Transfers Sit to Stand: 4: Min assist Stand to Sit: 4: Min assist   Exercise     Balance Balance Balance Assessed: Yes Static Standing Balance Static Standing - Balance Support: Left upper extremity supported;No upper extremity supported Static Standing - Level of Assistance: 4: Min assist;5: Stand by assistance Dynamic Standing Balance Dynamic Standing - Balance Support: No upper extremity supported Dynamic Standing - Level of Assistance: 4: Min assist    End of Session OT - End of Session Activity Tolerance: Patient tolerated treatment well Patient left: in chair;with call bell/phone within reach  GO      Galen Manila 05/12/2012, 4:04 PM

## 2012-05-12 NOTE — Plan of Care (Signed)
Problem: Phase II Progression Outcomes Goal: Discharge plan established Recommend CIR for further therapy needs after acute care d/c     

## 2012-05-13 ENCOUNTER — Inpatient Hospital Stay (HOSPITAL_COMMUNITY)
Admission: RE | Admit: 2012-05-13 | Discharge: 2012-05-21 | DRG: 945 | Disposition: A | Discharge status: Home Health | Payer: Medicare Other | Source: Intra-hospital | Attending: Physical Medicine & Rehabilitation | Admitting: Physical Medicine & Rehabilitation

## 2012-05-13 DIAGNOSIS — I10 Essential (primary) hypertension: Secondary | ICD-10-CM

## 2012-05-13 DIAGNOSIS — G40909 Epilepsy, unspecified, not intractable, without status epilepticus: Secondary | ICD-10-CM | POA: Diagnosis present

## 2012-05-13 DIAGNOSIS — G8190 Hemiplegia, unspecified affecting unspecified side: Secondary | ICD-10-CM | POA: Diagnosis present

## 2012-05-13 DIAGNOSIS — I63512 Cerebral infarction due to unspecified occlusion or stenosis of left middle cerebral artery: Secondary | ICD-10-CM | POA: Diagnosis present

## 2012-05-13 DIAGNOSIS — Z5189 Encounter for other specified aftercare: Principal | ICD-10-CM

## 2012-05-13 DIAGNOSIS — F17200 Nicotine dependence, unspecified, uncomplicated: Secondary | ICD-10-CM | POA: Diagnosis present

## 2012-05-13 DIAGNOSIS — I6340 Cerebral infarction due to embolism of unspecified cerebral artery: Secondary | ICD-10-CM | POA: Diagnosis present

## 2012-05-13 DIAGNOSIS — I6330 Cerebral infarction due to thrombosis of unspecified cerebral artery: Secondary | ICD-10-CM

## 2012-05-13 LAB — GLUCOSE, CAPILLARY: Glucose-Capillary: 115 mg/dL — ABNORMAL HIGH (ref 70–99)

## 2012-05-13 MED ORDER — BISACODYL 10 MG RE SUPP
10.0000 mg | Freq: Every day | RECTAL | Status: DC | PRN
  Administered 2012-05-15 – 2012-05-19 (×2): 10 mg via RECTAL
  Filled 2012-05-13 (×2): qty 1

## 2012-05-13 MED ORDER — LEVETIRACETAM 500 MG PO TABS
1000.0000 mg | ORAL_TABLET | Freq: Two times a day (BID) | ORAL | Status: DC
  Administered 2012-05-13 – 2012-05-21 (×16): 1000 mg via ORAL
  Filled 2012-05-13 (×20): qty 2

## 2012-05-13 MED ORDER — ENSURE COMPLETE PO LIQD
237.0000 mL | Freq: Two times a day (BID) | ORAL | Status: DC
  Administered 2012-05-14 – 2012-05-21 (×10): 237 mL via ORAL

## 2012-05-13 MED ORDER — GUAIFENESIN-DM 100-10 MG/5ML PO SYRP: 5.0000 mL | ORAL_SOLUTION | Freq: Four times a day (QID) | ORAL | Status: DC | PRN

## 2012-05-13 MED ORDER — AMLODIPINE BESYLATE 2.5 MG PO TABS
2.5000 mg | ORAL_TABLET | Freq: Every day | ORAL | Status: DC
  Administered 2012-05-13: 2.5 mg via ORAL
  Filled 2012-05-13: qty 1

## 2012-05-13 MED ORDER — ACETAMINOPHEN 325 MG PO TABS: 325.0000 mg | ORAL_TABLET | ORAL | Status: DC | PRN

## 2012-05-13 MED ORDER — TRAZODONE HCL 50 MG PO TABS: 25.0000 mg | ORAL_TABLET | Freq: Every evening | ORAL | Status: DC | PRN

## 2012-05-13 MED ORDER — DIPHENHYDRAMINE HCL 12.5 MG/5ML PO ELIX: 12.5000 mg | ORAL_SOLUTION | Freq: Four times a day (QID) | ORAL | Status: DC | PRN

## 2012-05-13 MED ORDER — ALUM & MAG HYDROXIDE-SIMETH 200-200-20 MG/5ML PO SUSP: 30.0000 mL | ORAL | Status: DC | PRN

## 2012-05-13 MED ORDER — AMLODIPINE BESYLATE 2.5 MG PO TABS
2.5000 mg | ORAL_TABLET | Freq: Every day | ORAL | Status: DC
  Administered 2012-05-14 – 2012-05-21 (×8): 2.5 mg via ORAL
  Filled 2012-05-13 (×9): qty 1

## 2012-05-13 MED ORDER — POLYETHYLENE GLYCOL 3350 17 G PO PACK
17.0000 g | PACK | Freq: Every day | ORAL | Status: DC | PRN
  Administered 2012-05-18 – 2012-05-19 (×2): 17 g via ORAL
  Filled 2012-05-13 (×3): qty 1

## 2012-05-13 MED ORDER — ENOXAPARIN SODIUM 40 MG/0.4ML ~~LOC~~ SOLN
40.0000 mg | SUBCUTANEOUS | Status: DC
  Administered 2012-05-13 – 2012-05-20 (×8): 40 mg via SUBCUTANEOUS
  Filled 2012-05-13 (×9): qty 0.4

## 2012-05-13 MED ORDER — PROCHLORPERAZINE EDISYLATE 5 MG/ML IJ SOLN
5.0000 mg | Freq: Four times a day (QID) | INTRAMUSCULAR | Status: DC | PRN
  Filled 2012-05-13: qty 2

## 2012-05-13 MED ORDER — PROCHLORPERAZINE 25 MG RE SUPP
12.5000 mg | Freq: Four times a day (QID) | RECTAL | Status: DC | PRN
  Filled 2012-05-13: qty 1

## 2012-05-13 MED ORDER — FLEET ENEMA 7-19 GM/118ML RE ENEM: 1.0000 | ENEMA | Freq: Once | RECTAL | Status: AC | PRN

## 2012-05-13 MED ORDER — PROCHLORPERAZINE MALEATE 5 MG PO TABS
5.0000 mg | ORAL_TABLET | Freq: Four times a day (QID) | ORAL | Status: DC | PRN
  Filled 2012-05-13: qty 2

## 2012-05-13 MED ORDER — ASPIRIN 81 MG PO CHEW
81.0000 mg | CHEWABLE_TABLET | Freq: Every day | ORAL | Status: DC
  Administered 2012-05-14 – 2012-05-21 (×8): 81 mg via ORAL
  Filled 2012-05-13 (×8): qty 1

## 2012-05-13 NOTE — Progress Notes (Signed)
Bed available CIR-pt agreeable CIR-can admit pt to CIR today, if medically ready. [phone]

## 2012-05-13 NOTE — PMR Pre-admission (Signed)
PMR Admission Coordinator Pre-Admission Assessment  Patient: Kyle Dudley is an 72 y.o., male MRN: [id number] DOB: December 22, 1940 Height: 5\' 4"  (162.6 cm) Weight: 48 kg (105 lb 13.1 oz)              Insurance Information HMO:      PPO:       PCP:       IPA:       80/20:       OTHER: yes PRIMARY: Medicare      Policy#: [id number]      Subscriber: pt  Employer: retired Benefits:       Name: Armed forces technical officer. Date: 07/27/2006 A & B     Deduct: $1216.00/year      Out of Pocket Max:        Life Max:   CIR: 100% after deduct      SNF: 100% days 1-20;$152.00/day days 21-100 Outpatient: 80%     Co-Pay: 20% Home Health: 100%     Medical nec DME: 80%     Co-Pay: 20% Providers: pt's choice SECONDARY: Medicaid [place] Access      Policy#: [id number] k      Subscriber: pt   Emergency Contact Information Contact Information   Name Relation Home Work Mobile   [place] [phone]     [place],Tiffany Niece [phone]  [phone]     Current Medical History  Patient Admitting Diagnosis:  left centrum semiovale infarct  History of Present Illness:   72 y.o. right-handed male with history of seizure disorder maintained on Keppra and tobacco abuse. Admitted 05/10/2012 with right-sided weakness. 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 MRA of the head with no significant stenosis. Echocardiogram and carotid Dopplers pending. Patient did not receive TPA. Placed on aspirin therapy for stroke prophylaxis as well as subcutaneous heparin for DVT prophylaxis. Physical therapy evaluation completed 05/11/2012 with recommendations of physical medicine rehabilitation consult to consider inpatient rehabilitation services  Total: 4    Past Medical History  Past Medical History  Diagnosis Date  . Seizures     Family History  family history includes Stroke in his sister.  Prior Rehab/Hospitalizations: no   Current Medications  Current facility-administered  medications:amLODipine (NORVASC) tablet 2.5 mg, 2.5 mg, Oral, Daily, Belkys A Regalado, MD;  aspirin chewable tablet 81 mg, 81 mg, Oral, Daily, Jared M. Gardner, DO, 81 mg at 05/13/12 1013;  feeding supplement (ENSURE COMPLETE) liquid 237 mL, 237 mL, Oral, BID BM, Heather Cornelison Pitts, RD, 237 mL at 05/13/12 1014 heparin injection 5,000 Units, 5,000 Units, Subcutaneous, Q8H, Hillary Bow, DO, 5,000 Units at 05/13/12 6440;  levETIRAcetam (KEPPRA) tablet 1,000 mg, 1,000 mg, Oral, BID, Belkys A Regalado, MD, 1,000 mg at 05/13/12 1013  Patients Current Diet: General  Precautions / Restrictions Precautions Precautions: Fall Restrictions Weight Bearing Restrictions: No   Prior Activity Level  Independent Home Assistive Devices / Equipment Home Assistive Devices/Equipment: None Home Adaptive Equipment: None  Prior Functional Level Prior Function Level of Independence: Independent Able to Take Stairs?: Yes Driving: Yes Vocation: Retired  Current Functional Level Cognition  Arousal/Alertness: Awake/alert Overall Cognitive Status: Impaired/Different from baseline (pt became tearful when about girlfriend that died  last year) Orientation Level: Oriented X4 Following Commands: Follows one step commands consistently;Follows multi-step commands consistently Attention: Focused;Sustained Focused Attention: Appears intact Sustained Attention: Impaired Sustained Attention Impairment: Verbal basic;Verbal complex;Functional basic Selective Attention: Impaired Selective Attention Impairment: Verbal basic;Functional basic;Verbal complex Memory: Impaired Memory Impairment: Storage deficit;Retrieval deficit;Decreased recall of  new information;Decreased short term memory Decreased Short Term Memory: Verbal basic;Functional basic Awareness: Impaired Awareness Impairment: Intellectual impairment;Emergent impairment;Anticipatory impairment Problem Solving: Impaired Problem Solving Impairment:  Verbal basic;Functional basic Executive Function: Reasoning Reasoning: Impaired Behaviors: Poor frustration tolerance;Other (comment) (All Pt wants is for RUE to function better) Safety/Judgment: Impaired    Extremity Assessment (includes Sensation/Coordination)  RUE ROM/Strength/Tone: Deficits RUE ROM/Strength/Tone Deficits: elbow, shoulder strength 3+/5, R hand/wrist 3/5  RUE Sensation: WFL - Light Touch RUE Coordination: Deficits RUE Coordination Deficits: decreased grip and grasp, FMC/GMC impaired. Difficulty picking up objects and cutting food, donning/doffing shoes  RLE ROM/Strength/Tone: Deficits RLE ROM/Strength/Tone Deficits: grossly 4+/5 RLE Sensation: WFL - Light Touch RLE Coordination: Deficits RLE Coordination Deficits: motor delay    ADLs  Grooming: Wash/dry hands;Wash/dry face;Teeth care;Min guard;Minimal assistance Where Assessed - Grooming: Supported standing Upper Body Bathing: Simulated;Supervision/safety;Set up Lower Body Bathing: Simulated;Minimal assistance Upper Body Dressing: Performed;Supervision/safety;Set up Lower Body Dressing: Performed;Minimal assistance Toilet Transfer: Performed;Minimal assistance Toilet Transfer Method: Sit to stand Toilet Transfer Equipment: Regular height toilet Toileting - Clothing Manipulation and Hygiene: Min guard;Performed Where Assessed - Toileting Clothing Manipulation and Hygiene: Standing ADL Comments: pt demos diffulty with min A required for manipulating LB clothing and for oral hygiene with manipulating toiletry items due to R UE FMC/GMC deficits    Mobility  Bed Mobility: Not assessed Supine to Sit: 5: Supervision    Transfers  Transfers: Sit to Stand;Stand to Sit Sit to Stand: 4: Min assist Stand to Sit: 4: Min assist    Ambulation / Gait / Stairs / Psychologist, prison and probation services  Ambulation/Gait Ambulation/Gait Assistance: 4: Min Environmental consultant (Feet): 120 Feet Assistive device: None Ambulation/Gait  Assistance Details: improved stability when he maintains selective attention however when distracted and trying to alternate/perform head turns he loses his balance and drifts to the right needing assist to correct, slower reaction time noted; cues for bigger step with improved control during swing of RLE (does drag at times) Gait Pattern: Step-through pattern;Decreased step length - right;Decreased weight shift to right Gait velocity: decreased General Gait Details: decreased step height on the right Stairs: No    Posture / Balance Dynamic Sitting Balance Dynamic Sitting - Balance Support: No upper extremity supported Dynamic Sitting - Level of Assistance: 5: Stand by assistance Dynamic Sitting Balance - Compensations: when challenged with MMT his trunk tends to drift to the right with decreased awareness, cues to awareness and correction Reach (Patient is able to reach ___ inches to right, left, forward, back): picking up objects of various sizes and reaching accross midline and outside BOS to place or hand them to me (focus on trunk control and grasp activities) (pt in recliner with trunk away from the back of the chair) Dynamic Sitting - Balance Activities: Lateral lean/weight shifting;Reaching for objects;Reaching across midline;Reaching for weighted objects;Trunk control activities Static Standing Balance Static Standing - Balance Support: Left upper extremity supported;No upper extremity supported Static Standing - Level of Assistance: 4: Min assist;5: Stand by assistance Static Standing - Comment/# of Minutes: with LUE support pt needs mingaurdA, without needing minA  Dynamic Standing Balance Dynamic Standing - Balance Support: No upper extremity supported Dynamic Standing - Level of Assistance: 4: Min assist Dynamic Standing - Balance Activities: Lateral lean/weight shifting Dynamic Standing - Comments: 10 reciprocal toe taps up on to wash basin with emphasis on trunk and movement  control; shaky with weight shift to the right requiring minA for stability improving with engagement of trunk and cues to weight bear fully through  RLE High Level Balance High Level Balance Comments: difficulty with speed changes, pt avoidant of increased speed    Special needs/care consideration  Skin    No problems noted                           Bowel mgmt: LBM 05/12/12 Bladder mgmt: continent     Previous Home Environment Living Arrangements: Alone Lives With: Alone Available Help at Discharge: Family;Available PRN/intermittently Type of Home: Independent living facility? Home Layout: One level Home Access: Level entry Bathroom Shower/Tub: Engineer, manufacturing systems: Standard Home Care Services: No Additional Comments: It is possible he lives in an ILF, says he has an emergency string in his bathroom  Discharge Living Setting Plans for Discharge Living Setting: Patient's home Type of Home at Discharge: Apartment Discharge Home Layout: One level Discharge Home Access: Level entry Do you have any problems obtaining your medications?: No  Social/Family/Support Systems Patient Roles: Other (Comment) (uncle, brother) Contact Information: cell# [phone] Anticipated Caregiver: niece & nephew Anticipated Caregiver's Contact Information: niece, Tiffany cell# [phone]; nephew, Casimiro Needle [phone] Caregiver Availability: Intermittent Discharge Plan Discussed with Primary Caregiver: Yes Is Caregiver In Agreement with Plan?: Yes Does Caregiver/Family have Issues with Lodging/Transportation while Pt is in Rehab?: No    Goals/Additional Needs Patient/Family Goal for Rehab: Mod I Expected length of stay: 7-10 days Pt/Family Agrees to Admission and willing to participate: Yes Program Orientation Provided & Reviewed with Pt/Caregiver Including Roles  & Responsibilities: Yes   Decrease burden of Care through IP rehab admission: Patient/family education   Possible  need for SNF placement upon discharge:  no   Patient Condition: This patient's condition remains as documented in the consult dated 05/12/12, in which the Rehabilitation Physician determined and documented that the patient's condition is appropriate for intensive rehabilitative care in an inpatient rehabilitation facility. Will admit to inpatient rehab today.  Preadmission Screen Completed By:  Brock Ra, 05/13/2012 11:01 AM ______________________________________________________________________   Discussed status with Dr. Riley Kill on 05/13/12 at 11:03am and received telephone approval for admission today.  Admission Coordinator:  Brock Ra, time 11:03am/Date 05/13/12

## 2012-05-13 NOTE — Discharge Summary (Addendum)
Physician Discharge Summary  Kyle Dudley XBJ:[id number] DOB: 01-Oct-1940 DOA: 05/10/2012  PCP: Default, Provider, MD  Admit date: 05/10/2012 Discharge date: 05/13/2012  Time spent: 35  minutes  Recommendations for Outpatient Follow-up:  Need thyroid US to follow thyroid Cyst. Need adjustment of BP medications.   Discharge Diagnoses:  Acute ischemic left MCA stroke Seizure disorder Hypertension. Incidental thyroid cyst by carotid doppler.    Discharge Condition: Stable  Diet recommendation: heart healthy.  Filed Weights   05/11/12 0200  Weight: 48 kg (105 lb 13.1 oz)    History of present illness:  Kyle Dudley is a 72 y.o. male who presents with RUE and RLE weakness, described as dragging his leg, patient is slightly confused / unclear about how long it has been going on but basically has been going on for either 24 or 48 hours (out of TPA window). His only previous medical history is a history of seizure disorder for which he takes keppra though his sister had a stroke in 2000.   Hospital Course:  1. Acute infarct left Centrum semioval: MRI positive for stroke. LDL 93. HB-A1c 4.9 . Aspirin daily.  ECHO negative for thrombus, doppler no significant ICA stenosis. MRA no stenosis. Smoking cessation counseling. Patient stable to be transfer to inpatient rehab.  2. HTN: BP increasing. I will start low dose Norvasc. Titrate as needed.  3. Seizure: change Keppra to oral.  4. Incidental finding: bilateral thyroid cysts : needs thyroid US and work up out patient.   Procedures: ECHO: Left ventricle: The cavity size was normal. Wall thickness was normal. Systolic function was normal. The estimated ejection fraction was in the range of 55% to 65%. Wall motion was normal; there were no regional wall motion abnormalities.   Carotid doppler:  No significant extracranial carotid artery stenosis demonstrated. Vertebrals are patent with antegrade flow. - ICA/CCA ratio= Right = 0.73. Left = 1.1. -  Incidental finding: bilateral thyroid cysts.     Consultations:  Dr Anne Hahn with neurology.  Discharge Exam: Filed Vitals:   05/12/12 2153 05/12/12 2234 05/13/12 0219 05/13/12 0635  BP: 191/86 151/91 151/83 151/98  Pulse: 60 59 66 74  Temp: 97.3 F (36.3 C)  97.3 F (36.3 C) 97.4 F (36.3 C)  TempSrc: Oral  Oral Oral  Resp:   18 20  Height:      Weight:      SpO2: 100%  97%     General: no distress. Cardiovascular: S 1, S 2 RRR Respiratory: CTA Neuro: right hand grip weak, left LE 4/5.    Discharge Instructions     Medication List    TAKE these medications       levETIRAcetam 500 MG 24 hr tablet  Commonly known as:  KEPPRA XR  Take 2,000 mg by mouth at bedtime.          The results of significant diagnostics from this hospitalization (including imaging, microbiology, ancillary and laboratory) are listed below for reference.    Significant Diagnostic Studies: Dg Chest 2 View  05/10/2012  *RADIOLOGY REPORT*  Clinical Data: Stroke symptoms  CHEST - 2 VIEW  Comparison: None.  Findings: Cardiomediastinal silhouette is unremarkable.  No acute infiltrate or pleural effusion.  No pulmonary edema.  Mild thoracic spine osteopenia.  IMPRESSION: No active disease.   Original Report Authenticated By: Natasha Mead, M.D.    Ct Head Wo Contrast  05/10/2012  *RADIOLOGY REPORT*  Clinical Data: 72 year old male who awoke with right-sided weakness yesterday.  Hypertension.  CT HEAD  WITHOUT CONTRAST  Technique:  Contiguous axial images were obtained from the base of the skull through the vertex without contrast.  Comparison: None.  Findings: Scattered mild paranasal sinus mucosal thickening. Mastoids are clear. Visualized orbit soft tissues are within normal limits.  Visualized scalp soft tissues are within normal limits. No acute osseous abnormality identified.  Mild Calcified atherosclerosis at the skull base.  Chronic-appearing lacuna/hypodensity right pons.  Chronic-appearing  posterior left corona radiata lacunar infarct.  Mildly more indistinct central left corona radiata hypodensity. No midline shift, mass effect, or evidence of mass lesion.  No acute intracranial hemorrhage identified.  No cortical encephalomalacia. No suspicious intracranial vascular hyperdensity. No evidence of cortically based acute infarction identified.  IMPRESSION: Chronic small vessel ischemia.  Age indeterminate area of involvement in the left corona radiata (image 16).  Otherwise no evidence of acute intracranial abnormality.   Original Report Authenticated By: Erskine Speed, M.D.    Mr Angiogram Head Wo Contrast  05/10/2012  *RADIOLOGY REPORT*  Clinical Data:  Stroke.  Right-sided weakness  MRI HEAD WITHOUT CONTRAST MRA HEAD WITHOUT CONTRAST  Technique:  Multiplanar, multiecho pulse sequences of the brain and surrounding structures were obtained without intravenous contrast. Angiographic images of the head were obtained using MRA technique without contrast.  Comparison:  CT head 05/10/2012  MRI HEAD  Findings:  Acute infarct in the centrum semiovale on the left measuring approximately 15 mm in diameter.  No other acute infarct identified.  Chronic lacunar infarctions in the pons bilaterally.  Chronic infarct in the right thalamus.  Mild chronic microvascular ischemic change in the cerebral white matter.  Small chronic infarct right cerebellum.  Negative for hemorrhage or mass.  Ventricle size is normal.  No shift to the midline structures.  IMPRESSION: Acute infarct in the left centrum semiovale.  Moderate chronic ischemic changes.  MRA HEAD  Findings: Both vertebral arteries are patent to the basilar.  Left vertebral artery is dominant.  The basilar is patent.  Posterior cerebral artery is patent bilaterally.  Posterior cerebral arteries are patent bilaterally.  Right internal carotid artery is patent.  Right anterior and middle cerebral arteries are patent bilaterally.  Mild stenosis right A1 segment.   The left internal carotid artery is widely patent.  The left anterior and middle cerebral arteries are widely patent.  Negative for cerebral aneurysm.  IMPRESSION: No significant intracranial stenosis.   Original Report Authenticated By: Janeece Riggers, M.D.    Mr Brain Wo Contrast  05/10/2012  *RADIOLOGY REPORT*  Clinical Data:  Stroke.  Right-sided weakness  MRI HEAD WITHOUT CONTRAST MRA HEAD WITHOUT CONTRAST  Technique:  Multiplanar, multiecho pulse sequences of the brain and surrounding structures were obtained without intravenous contrast. Angiographic images of the head were obtained using MRA technique without contrast.  Comparison:  CT head 05/10/2012  MRI HEAD  Findings:  Acute infarct in the centrum semiovale on the left measuring approximately 15 mm in diameter.  No other acute infarct identified.  Chronic lacunar infarctions in the pons bilaterally.  Chronic infarct in the right thalamus.  Mild chronic microvascular ischemic change in the cerebral white matter.  Small chronic infarct right cerebellum.  Negative for hemorrhage or mass.  Ventricle size is normal.  No shift to the midline structures.  IMPRESSION: Acute infarct in the left centrum semiovale.  Moderate chronic ischemic changes.  MRA HEAD  Findings: Both vertebral arteries are patent to the basilar.  Left vertebral artery is dominant.  The basilar is patent.  Posterior cerebral  artery is patent bilaterally.  Posterior cerebral arteries are patent bilaterally.  Right internal carotid artery is patent.  Right anterior and middle cerebral arteries are patent bilaterally.  Mild stenosis right A1 segment.  The left internal carotid artery is widely patent.  The left anterior and middle cerebral arteries are widely patent.  Negative for cerebral aneurysm.  IMPRESSION: No significant intracranial stenosis.   Original Report Authenticated By: Janeece Riggers, M.D.     Microbiology: Recent Results (from the past 240 hour(s))  URINE CULTURE     Status:  None   Collection Time    05/10/12  8:32 PM      Result Value Range Status   Specimen Description URINE, CLEAN CATCH   Final   Special Requests NONE   Final   Culture  Setup Time 05/10/2012 21:06   Final   Colony Count NO GROWTH   Final   Culture NO GROWTH   Final   Report Status 05/11/2012 FINAL   Final     Labs: Basic Metabolic Panel:  Recent Labs Lab 05/10/12 1845 05/10/12 1911  NA 141 143  K 3.7 3.6  CL 104 105  CO2 25  --   GLUCOSE 103* 104*  BUN 7 5*  CREATININE 0.65 0.60  CALCIUM 9.8  --    Liver Function Tests:  Recent Labs Lab 05/10/12 1845  AST 20  ALT 11  ALKPHOS 95  BILITOT 0.5  PROT 7.5  ALBUMIN 4.2   No results found for this basename: LIPASE, AMYLASE,  in the last 168 hours No results found for this basename: AMMONIA,  in the last 168 hours CBC:  Recent Labs Lab 05/10/12 1845 05/10/12 1911  WBC 8.7  --   NEUTROABS 5.2  --   HGB 16.8 17.3*  HCT 46.3 51.0  MCV 89.9  --   PLT 247  --    Cardiac Enzymes:  Recent Labs Lab 05/10/12 1846  TROPONINI <0.30   BNP: BNP (last 3 results) No results found for this basename: PROBNP,  in the last 8760 hours CBG:  Recent Labs Lab 05/12/12 0633 05/12/12 1132 05/12/12 1626 05/12/12 2149 05/13/12 0725  GLUCAP 75 114* 115* 85 84       Signed:  Dareon Nunziato  [facility] 05/13/2012, 10:10 AM   Severe Malnutrition. Ensure BID. 9 % weight loss.

## 2012-05-13 NOTE — Progress Notes (Signed)
Report called to Berline Lopes, RN on in-pt. Rehab, pt to transfer to [id number].  Earney Mallet, RN

## 2012-05-13 NOTE — Plan of Care (Signed)
Overall Plan of Care [facility]) Patient Details Name: Kyle Dudley MRN: [id number] DOB: Jun 12, 1940  Diagnosis:  acute infarct in the left centrum semiovale Right Hemiparesis   Co-morbidities: Seizure D/O  Functional Problem List  Patient demonstrates impairments in the following areas: Balance, Cognition, Endurance, Medication Management, Motor, Nutrition, Perception, Safety and Skin Integrity  Basic ADL's: eating, grooming, bathing, dressing and toileting Advanced ADL's: simple meal preparation, laundry and light housekeeping  Transfers:  bed mobility, bed to chair, toilet, tub/shower, car, furniture and floor Locomotion:  ambulation, wheelchair mobility and stairs  Additional Impairments:  Communication  expression, Social Cognition   problem solving and memory and Discharge Disposition  Anticipated Outcomes Item Anticipated Outcome  Eating/Swallowing    Basic self-care  Mod I  Tolieting  Independent  Bowel/Bladder  Continent bowel/bladder, mod independence  Transfers  Mod I  Locomotion  Mod I x150' with LRAD, 5 stairs with one HR, 150' S w/c mobility  Communication  Supervision  Cognition  Mod I   Pain  No c/o pain  Safety/Judgment  Mod I   Other  Skin: no new breakdown while on rehab    Therapy Plan: PT Intensity: Minimum of 1-2 x/day ,45 to 90 minutes PT Frequency: 5 out of 7 days PT Duration Estimated Length of Stay:  days OT Intensity: Minimum of 1-2 x/day, 45 to 90 minutes OT Frequency: 5 out of 7 days OT Duration/Estimated Length of Stay: 7-10 days SLP Intensity: Minumum of 1-2 x/day, 30 to 90 minutes SLP Frequency: 5 out of 7 days SLP Duration/Estimated Length of Stay: 10-14 days    Team Interventions: Item RN PT OT SLP SW TR Other  Self Care/Advanced ADL Retraining   x      Neuromuscular Re-Education  x x      Therapeutic Activities  x x x     UE/LE Strength Training/ROM  x x      UE/LE Coordination Activities  x x      Visual/Perceptual  Remediation/Compensation  x       DME/Adaptive Equipment Instruction  x x      Therapeutic Exercise  x x      Balance/Vestibular Training  x x      Patient/Family Education x x x x     Cognitive Remediation/Compensation  x  x     Functional Mobility Training  x x      Ambulation/Gait Training  x       Stair Training  x       Wheelchair Propulsion/Positioning  x       Functional Tourist information centre manager Reintegration  x       Dysphagia/Aspiration Landscape architect Facilitation    x     Bladder Management x        Bowel Management x        Disease Management/Prevention x        Pain Management x x       Medication Management x   x     Skin Care/Wound Management x        Splinting/Orthotics  x       Discharge Planning  x x x     Psychosocial Support  x                              Team Discharge Planning:  Destination: PT-Home ((ILF)) ,OT- Home , SLP-Home ([facility]) Projected Follow-up: PT-Home health PT, OT-  None, SLP- (TBD) Projected Equipment Needs: PT-None recommended by PT, OT- Tub/shower seat, SLP-None recommended by SLP Patient/family involved in discharge planning: PT- Patient,  OT-Patient, SLP-Patient  MD ELOS: 10-12 days Medical Rehab Prognosis:  Good Assessment: 72 year old male admitted for an acute CVA causing right hemiparesis. Now requiring CIR level PT OT 24 7 rehabilitation RN and M.D. Treatment goals are modified supervision level for ADLs and mobility.    See Team Conference Notes for weekly updates to the plan of care

## 2012-05-13 NOTE — Progress Notes (Signed)
Patient admitted to CIR at 1730. Alert and oriented x4, family at bedside, denies pain. Stroke education packet given, rehab schedule, call bell system, safety plan information given. Patient and family verbalized understanding. Hedy Camara

## 2012-05-13 NOTE — H&P (Signed)
Physical Medicine and Rehabilitation Admission H&P      Chief Complaint   Patient presents with   .  Right sided weakness.     : HPI: Kyle Dudley is a 72 y.o. right-handed male with history of seizure disorder maintained on Keppra and tobacco abuse. Admitted 05/10/2012 with right-sided weakness. 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 MRA of the head with no significant stenosis. 2D echo done revealing EF 55-65% and no wall abnormality. Carotid dopplers without ICA stenosis and incidental findings of bilateral thyroid cysts. 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 He continues with right hemiparesis with balance impairements. Therapy team recommending CIR for progressive therapies.    Review of Systems  HENT: Negative for hearing loss and neck pain.   Eyes: Negative for blurred vision and double vision.  Respiratory: Negative for cough and shortness of breath.   Cardiovascular: Negative for chest pain and palpitations.  Gastrointestinal: Negative for heartburn, nausea, vomiting and constipation.  Genitourinary: Positive for frequency. Negative for dysuria and urgency.  Musculoskeletal: Negative for myalgias and back pain.  Neurological: Positive for focal weakness. Negative for headaches.  Psychiatric/Behavioral: The patient is nervous/anxious.   Past Medical History   Diagnosis  Date   .  Seizures      History reviewed. No pertinent past surgical history.    Family History   Problem  Relation  Age of Onset   .  Stroke  Sister         2000    Social History:  Lives alone. 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 He reports that he has been smoking Cigarettes-3-4 daily.  He has a 27.5 pack-year smoking history. He does not have any smokeless tobacco history on file. He reports that he does not drink alcohol. His drug history is not on file.   Allergies: No Known Allergies    Medications Prior to Admission    Medication  Sig  Dispense  Refill   .  levETIRAcetam (KEPPRA XR) 500 MG 24 hr tablet  Take 2,000 mg by mouth at bedtime.            Home: Home Living Lives With: Alone Available Help at Discharge: Family;Available PRN/intermittently Type of Home: Independent living facility Home Access: Level entry Home Layout: One level Bathroom Shower/Tub: Engineer, manufacturing systems: Standard Home Adaptive Equipment: None Additional Comments: From what I gather he lives in an ILF, says he has an emergency string in his bathroom    Functional History: Prior Function Able to Take Stairs?: Yes Driving: Yes Vocation: Retired   Functional Status:   Mobility: Bed Mobility Bed Mobility: Not assessed Supine to Sit: 5: Supervision Transfers Transfers: Sit to Stand;Stand to [employer] to Stand: 4: Min assist Stand to Sit: 4: Min assist Ambulation/Gait Ambulation/Gait Assistance: 4: Min Environmental consultant (Feet): 120 Feet Assistive device: None Ambulation/Gait Assistance Details: improved stability when he maintains selective attention however when distracted and trying to alternate/perform head turns he loses his balance and drifts to the right needing assist to correct, slower reaction time noted; cues for bigger step with improved control during swing of RLE (does drag at times) Gait Pattern: Step-through pattern;Decreased step length - right;Decreased weight shift to right Gait velocity: decreased General Gait Details: decreased step height on the right Stairs: No   ADL: ADL Grooming: Wash/dry hands;Wash/dry face;Teeth care;Min guard;Minimal assistance Where Assessed - Grooming: Supported standing Upper  Body Bathing: Simulated;Supervision/safety;Set up Lower Body Bathing: Simulated;Minimal assistance Upper Body Dressing: Performed;Supervision/safety;Set up Lower Body Dressing: Performed;Minimal assistance Toilet Transfer: Performed;Minimal assistance Toilet Transfer Method:  Sit to stand Toilet Transfer Equipment: Regular height toilet ADL Comments: pt demos diffulty with min A required for manipulating LB clothing and for oral hygiene with manipulating toiletry items due to R UE FMC/GMC deficits   Cognition: Cognition Overall Cognitive Status: Impaired/Different from baseline (pt became tearful when about girlfriend that died  last year) Arousal/Alertness: Awake/alert Orientation Level: Oriented X4 Attention: Focused;Sustained Focused Attention: Appears intact Sustained Attention: Impaired Sustained Attention Impairment: Verbal basic;Verbal complex;Functional basic Selective Attention: Impaired Selective Attention Impairment: Verbal basic;Functional basic;Verbal complex Memory: Impaired Memory Impairment: Storage deficit;Retrieval deficit;Decreased recall of new information;Decreased short term memory Decreased Short Term Memory: Verbal basic;Functional basic Awareness: Impaired Awareness Impairment: Intellectual impairment;Emergent impairment;Anticipatory impairment Problem Solving: Impaired Problem Solving Impairment: Verbal basic;Functional basic Executive Function: Reasoning Reasoning: Impaired Behaviors: Poor frustration tolerance;Other (comment) (All Pt wants is for RUE to function better) Safety/Judgment: Impaired Cognition Arousal/Alertness: Awake/alert Behavior During Therapy: WFL for tasks assessed/performed Overall Cognitive Status: Impaired/Different from baseline (pt became tearful when about girlfriend that died  last year) Area of Impairment: Problem solving;Following commands Following Commands: Follows one step commands consistently;Follows multi-step commands consistently Problem Solving: Requires verbal cues   Physical Exam: Blood pressure 151/98, pulse 74, temperature 97.4 F (36.3 C), temperature source Oral, resp. rate 20, height 5\' 4"  (1.626 m), weight 48 kg (105 lb 13.1 oz), SpO2 97.00%. Physical Exam  Nursing note and  vitals reviewed. Constitutional: He is oriented to person, place, and time. He appears well-developed.  Thin male  HENT:  Dentition poor. Oral mucosa pink and moist. Head: Normocephalic and atraumatic.  Eyes: Pupils are equal, round, and reactive to light.  Cardiovascular: Normal rate and regular rhythm.  No murmurs Pulmonary/Chest: Effort normal and breath sounds normal. No wheezes or rales.  Abdominal: Soft. Bowel sounds are normal. Non distended Musculoskeletal: He exhibits no edema and no tenderness.  Neurological: He is alert and oriented to person, place, and time.  Speech is slightly diminished. Reasonable but basic insight and awareness. Follows commands without difficulty. RUE weakness with decreased coordination. + pronator drift and diminished FMC. RUE is 4/5. RLE is grossly 4+/5 with diminished FMC. No sensory deficits. Skin: Skin is warm and dry.         Results for orders placed during the hospital encounter of 05/10/12 (from the past 48 hour(s))   GLUCOSE, CAPILLARY     Status: Abnormal     Collection Time      05/11/12  4:55 PM       Result  Value  Range     Glucose-Capillary  135 (*)  70 - 99 mg/dL     Comment 1  Documented in Chart        Comment 2  Notify RN      GLUCOSE, CAPILLARY     Status: None     Collection Time      05/11/12 10:02 PM       Result  Value  Range     Glucose-Capillary  98   70 - 99 mg/dL   GLUCOSE, CAPILLARY     Status: None     Collection Time      05/12/12  6:33 AM       Result  Value  Range     Glucose-Capillary  75   70 - 99 mg/dL     Comment 1  Documented in Chart        Comment 2  Notify RN      GLUCOSE, CAPILLARY     Status: Abnormal     Collection Time      05/12/12 11:32 AM       Result  Value  Range     Glucose-Capillary  114 (*)  70 - 99 mg/dL     Comment 1  Documented in Chart        Comment 2  Notify RN      GLUCOSE, CAPILLARY     Status: Abnormal     Collection Time      05/12/12  4:26 PM       Result  Value  Range      Glucose-Capillary  115 (*)  70 - 99 mg/dL     Comment 1  Documented in Chart        Comment 2  Notify RN      GLUCOSE, CAPILLARY     Status: None     Collection Time      05/12/12  9:49 PM       Result  Value  Range     Glucose-Capillary  85   70 - 99 mg/dL     Comment 1  Documented in Chart        Comment 2  Notify RN      GLUCOSE, CAPILLARY     Status: None     Collection Time      05/13/12  7:25 AM       Result  Value  Range     Glucose-Capillary  84   70 - 99 mg/dL     Comment 1  Notify RN        Comment 2  Documented in Chart       No results found.   Post Admission Physician Evaluation: Functional deficits secondary  to left centrum semiovale thrombotic infarct with right hemiparesis Patient is admitted to receive collaborative, interdisciplinary care between the physiatrist, rehab nursing staff, and therapy team. Patient's level of medical complexity and substantial therapy needs in context of that medical necessity cannot be provided at a lesser intensity of care such as a SNF. Patient has experienced substantial functional loss from his/her baseline which was documented above under the "Functional History" and "Functional Status" headings.  Judging by the patient's diagnosis, physical exam, and functional history, the patient has potential for functional progress which will result in measurable gains while on inpatient rehab.  These gains will be of substantial and practical use upon discharge  in facilitating mobility and self-care at the household level. Physiatrist will provide 24 hour management of medical needs as well as oversight of the therapy plan/treatment and provide guidance as appropriate regarding the interaction of the two. 24 hour rehab nursing will assist with bowel and bladder function, educations, skin care, pain mgt and help integrate therapy concepts, techniques,education, etc. PT will assess and treat for/with: Lower extremity strength, range of motion,  stamina, balance, functional mobility, safety, adaptive techniques and equipment, NMR, education.   Goals are: mod I. OT will assess and treat for/with: ADL's, functional mobility, safety, upper extremity strength, adaptive techniques and equipment, NMR, education.   Goals are: mod I. SLP will assess and treat for/with: speech intelligibility, communication.  Goals are: mod I . Case Management and Social Worker will assess and treat for psychological issues and discharge planning. Team conference will be held weekly to assess progress  toward goals and to determine barriers to discharge. Patient will receive at least 3 hours of therapy per day at least 5 days per week. ELOS: one week +   Prognosis:  excellent     Medical Problem List and Plan: 1. DVT Prophylaxis/Anticoagulation: Pharmaceutical: Lovenox 2. Pain Management: N/A 3. Mood:  Offer ego support as with concerns/anxiety regarding loss of independence. LCSW to follow for evaluation.   4. Neuropsych: This patient is capable of making decisions on his/her own behalf. 5. H/o seizures: On keppra bid.   6. HTN: Blood pressures remain elevated. Started on norvasc today.       05/13/2012  Ranelle Oyster, MD, [place] [facility]

## 2012-05-14 ENCOUNTER — Inpatient Hospital Stay (HOSPITAL_COMMUNITY): Payer: Medicare Other

## 2012-05-14 ENCOUNTER — Inpatient Hospital Stay (HOSPITAL_COMMUNITY): Payer: Medicare Other | Admitting: Speech Pathology

## 2012-05-14 ENCOUNTER — Inpatient Hospital Stay (HOSPITAL_COMMUNITY): Payer: Medicare Other | Admitting: *Deleted

## 2012-05-14 DIAGNOSIS — I6330 Cerebral infarction due to thrombosis of unspecified cerebral artery: Secondary | ICD-10-CM

## 2012-05-14 NOTE — Progress Notes (Signed)
Subjective/Complaints: No complaints. Denies pain. Slept well. A 12 point review of systems has been performed and if not noted above is otherwise negative.   Objective: Vital Signs: Blood pressure 148/89, pulse 69, temperature 98 F (36.7 C), temperature source Oral, resp. rate 18, height 5\' 5"  (1.651 m), weight 94.6 kg (208 lb 8.9 oz), SpO2 99.00%. No results found. No results found for this basename: WBC, HGB, HCT, PLT,  in the last 72 hours No results found for this basename: NA, K, CL, CO, GLUCOSE, BUN, CREATININE, CALCIUM,  in the last 72 hours CBG (last 3)   Recent Labs  05/12/12 2149 05/13/12 0725 05/13/12 1148  GLUCAP 85 84 115*    Wt Readings from Last 3 Encounters:  05/13/12 94.6 kg (208 lb 8.9 oz)  05/11/12 48 kg (105 lb 13.1 oz)    Physical Exam:  Constitutional: He is oriented to person, place, and time. He appears well-developed.  Thin male  HENT: Dentition poor. Oral mucosa pink and moist.  Head: Normocephalic and atraumatic.  Eyes: Pupils are equal, round, and reactive to light.  Cardiovascular: Normal rate and regular rhythm. No murmurs  Pulmonary/Chest: Effort normal and breath sounds normal. No wheezes or rales.  Abdominal: Soft. Bowel sounds are normal. Non distended  Musculoskeletal: He exhibits no edema and no tenderness.  Neurological: He is alert and oriented to person, place, and time. A little slow to arouse this early am. Speech is slightly diminished. Reasonable but basic insight and awareness. Follows commands without difficulty. RUE weakness with decreased coordination. + pronator drift and diminished FMC. RUE is 4/5. RLE is grossly 4+/5 with diminished FMC. No sensory deficits.  Skin: Skin is warm and dry.    Assessment/Plan: 1. Functional deficits secondary to left centrum semiovale thrombotic infarct which require 3+ hours per day of interdisciplinary therapy in a comprehensive inpatient rehab setting. Physiatrist is providing close team  supervision and 24 hour management of active medical problems listed below. Physiatrist and rehab team continue to assess barriers to discharge/monitor patient progress toward functional and medical goals. FIM:                   Comprehension Comprehension Mode: Auditory Comprehension: 6-Follows complex conversation/direction: With extra time/assistive device  Expression Expression Mode: Verbal Expression: 5-Expresses basic needs/ideas: With no assist  Social Interaction Social Interaction: 6-Interacts appropriately with others with medication or extra time (anti-anxiety, antidepressant).  Problem Solving Problem Solving: 5-Solves basic problems: With no assist  Memory Memory: 7-Complete Independence: No helper  Medical Problem List and Plan:  1. DVT Prophylaxis/Anticoagulation: Pharmaceutical: Lovenox  2. Pain Management: N/A  3. Mood: Offer ego support as with concerns/anxiety regarding loss of independence. LCSW to follow for evaluation.  4. Neuropsych: This patient is capable of making decisions on his/her own behalf.  5. H/o seizures: On keppra bid.  6. HTN: Blood pressures remain elevated. Started on norvasc yesterday. Continue to adjust regimen as needed.  LOS (Days) 1 A FACE TO FACE EVALUATION WAS PERFORMED  SWARTZ,ZACHARY T 05/14/2012 8:45 AM

## 2012-05-14 NOTE — Progress Notes (Signed)
Physical Therapy Session Note  Patient Details  Name: Kyle Dudley MRN: [id number] Date of Birth: May 22, 1940  Today's Date: 05/14/2012 Time: 1330-1400 Time Calculation (min): 30 min  Short Term Goals: Week 1:  PT Short Term Goal 1 (Week 1): STGs=LTGs  Skilled Therapeutic Interventions/Progress Updates:    Patient received sitting in wheelchair. This session focused on gait training in controlled environment and dynamic standing balance through assessment of the BERG Balance Test, see details below. Patient returned to room and left supine in bed with all needs within reach and bed alarm on.  Therapy Documentation Precautions:  Precautions Precautions: Fall Restrictions Weight Bearing Restrictions: No Pain: Pain Assessment Pain Assessment: No/denies pain Pain Score: 0-No pain Locomotion : Ambulation Ambulation: Yes Ambulation/Gait Assistance: 4: Min assist Ambulation Distance (Feet): 157 Feet x2 Assistive device: None Ambulation/Gait Assistance Details: Verbal cues for precautions/safety;Verbal cues for gait pattern;Tactile cues for weight shifting;Tactile cues for posture Ambulation/Gait Assistance Details: Patient instructed in gait training x180' without AD. Patient requires verbal cues for increased R foot clearance, increasing R step length, and upright posture. Patient tends to drift R. Gait Gait: Yes Gait Pattern: Impaired Gait Pattern: Step-through pattern;Decreased step length - right;Decreased weight shift to right;Decreased stride length;Decreased dorsiflexion - right;Narrow base of support;Trunk flexed  Balance: Balance Balance Assessed: Yes Standardized Balance Assessment Standardized Balance Assessment: Berg Balance Test Berg Balance Test Sit to Stand: Able to stand without using hands and stabilize independently Standing Unsupported: Able to stand 2 minutes with supervision Sitting with Back Unsupported but Feet Supported on Floor or Stool: Able to sit safely and  securely 2 minutes Stand to Sit: Uses backs of legs against chair to control descent Transfers: Able to transfer safely, definite need of hands Standing Unsupported with Eyes Closed: Able to stand 10 seconds with supervision Standing Ubsupported with Feet Together: Able to place feet together independently and stand for 1 minute with supervision From Standing, Reach Forward with Outstretched Arm: Can reach forward >12 cm safely (5") From Standing Position, Pick up Object from Floor: Able to pick up shoe, needs supervision From Standing Position, Turn to Look Behind Over each Shoulder: Looks behind one side only/other side shows less weight shift Turn 360 Degrees: Able to turn 360 degrees safely but slowly Standing Unsupported, Alternately Place Feet on Step/Stool: Able to stand independently and complete 8 steps >20 seconds Standing Unsupported, One Foot in Front: Loses balance while stepping or standing Standing on One Leg: Able to lift leg independently and hold equal to or more than 3 seconds Total Score: 38/56 indicating patient is at a significant risk for falls (>80%).  See FIM for current functional status  Therapy/Group: Individual Therapy  Chipper Herb. Zyon Rosser, PT, DPT  05/14/2012, 2:43 PM

## 2012-05-14 NOTE — Evaluation (Signed)
Speech Language Pathology Assessment and Plan  Patient Details  Name: Kyle Dudley MRN: [id number] Date of Birth: 03/21/1940  SLP Diagnosis: Cognitive Impairments  Rehab Potential: Good ELOS: 10-14 days   Today's Date: 05/14/2012 Time: 0907-1000 Time Calculation (min): 53 min  Problem List:  Patient Active Problem List  Diagnosis  . Acute ischemic left MCA stroke  . Seizure disorder  . Unspecified essential hypertension   Past Medical History:  Past Medical History  Diagnosis Date  . Seizures    Past Surgical History: No past surgical history on file.  Assessment / Plan / Recommendation Clinical Impression  Kyle Dudley is a 72 y.o. right-handed male with history of seizure disorder maintained on Keppra and tobacco abuse. Admitted 05/10/2012 with right-sided weakness. 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 MRA of the head with no significant stenosis. 2D echo done revealing EF 55-65% and no wall abnormality. Carotid dopplers without ICA stenosis and incidental findings of bilateral thyroid cysts. 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 He continues with right hemiparesis with balance impairments. Therapy team recommending CIR for progressive therapies; patient admitted 05/13/12.  Cognitive-Linguistic evaluation completed and revealed mild high level deficits characterized by decreased recall of new information, word retrieval deficits and difficulty sequencing/organizing complex information.  As a result, it is recommended that this patient receive skilled SLP services to address these deficits to maximize safety and functional independence with daily tasks prior to discharge home to independence living.    SLP initiated treatment and facilitated session by implementing an external aids to assist with recall of new medications.  Patient was unable to recall meds prior to implementing this external recall aid.     SLP Assessment  Patient will need skilled Speech Lanaguage Pathology Services during CIR admission    Recommendations  Patient destination: Home ([facility] Follow up Recommendations:  (TBD) Equipment Recommended: None recommended by SLP    SLP Frequency 5 out of 7 days   SLP Treatment/Interventions Cognitive remediation/compensation;Cueing hierarchy;Functional tasks;Internal/external aids;Medication managment;Patient/family education;Speech/Language facilitation;Therapeutic Activities    Pain Pain Assessment Pain Assessment: No/denies pain Pain Score: 0-No pain Prior Functioning Cognitive/Linguistic Baseline: Information not available Type of Home: Independent living facility Lives With: Alone Available Help at Discharge: Family;Available PRN/intermittently Vocation: Retired  Teacher, music Term Goals: Week 1: SLP Short Term Goal 1 (Week 1): Patient will demonstrate complex problem solving with supervision level verbal cues. SLP Short Term Goal 2 (Week 1): Patient will utilize word finding strategies during conversational expression with min verbal cues. SLP Short Term Goal 3 (Week 1): Patient will utilize external aids to assist with recall of daily informaiton with supervision verbal cues.  See FIM for current functional status Refer to Care Plan for Long Term Goals  Recommendations for other services: None  Discharge Criteria: Patient will be discharged from SLP if patient refuses treatment 3 consecutive times without medical reason, if treatment goals not met, if there is a change in medical status, if patient makes no progress towards goals or if patient is discharged from hospital.  The above assessment, treatment plan, treatment alternatives and goals were discussed and mutually agreed upon: by patient  Fae Pippin, M.A., CCC-SLP [phone]  Kyle Dudley 05/14/2012, 10:28 AM

## 2012-05-14 NOTE — Evaluation (Signed)
Physical Therapy Assessment and Plan  Patient Details  Name: Kyle Dudley MRN: [id number] Date of Birth: 12/30/1940  PT Diagnosis: Abnormality of gait, Coordination disorder, Hemiparesis dominant and Muscle weakness Rehab Potential: Good ELOS:  7-10 days   Today's Date: 05/14/2012 Time: 0800-0900 Time Calculation (min): 60 min  Problem List:  Patient Active Problem List  Diagnosis  . Acute ischemic left MCA stroke  . Seizure disorder  . Unspecified essential hypertension    Past Medical History:  Past Medical History  Diagnosis Date  . Seizures    Past Surgical History: No past surgical history on file.  Assessment & Plan Clinical Impression: Tadashi Burkel is a 72 y.o. right-handed male with history of seizure disorder maintained on Keppra and tobacco abuse. Admitted 05/10/2012 with right-sided weakness. 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 MRA of the head with no significant stenosis. 2D echo done revealing EF 55-65% and no wall abnormality. Carotid dopplers without ICA stenosis and incidental findings of bilateral thyroid cysts. 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 He continues with right hemiparesis with balance impairements. Therapy team recommending CIR for progressive therapies. Patient transferred to CIR on 05/13/2012 .   Patient currently requires min with mobility secondary to muscle weakness, decreased cardiorespiratoy endurance, impaired timing and sequencing, abnormal tone, unbalanced muscle activation and decreased coordination, decreased midline orientation, decreased awareness, decreased problem solving, decreased safety awareness and delayed processing and decreased sitting balance, decreased standing balance, decreased postural control, hemiplegia and decreased balance strategies.  Prior to hospitalization, patient was independent  with mobility and lived with Alone in a Independent living facility home.  Home  access is  Level entry.  Patient will benefit from skilled PT intervention to maximize safe functional mobility and minimize fall risk for planned discharge home with intermittent assist.  Anticipate patient will benefit from follow up [facility] at discharge.  PT - End of Session Activity Tolerance: Tolerates 30+ min activity with multiple rests Endurance Deficit: No PT Assessment Rehab Potential: Good Barriers to Discharge: Decreased caregiver support Barriers to Discharge Comments: Patient reports nephew may be able to provide some assistance upon discharge, however nephew must also assist his mother. PT Plan PT Intensity: Minimum of 1-2 x/day ,45 to 90 minutes PT Frequency: 5 out of 7 days PT Duration Estimated Length of Stay:  days PT Treatment/Interventions: Ambulation/gait training;Balance/vestibular training;Cognitive remediation/compensation;Community reintegration;Discharge planning;Neuromuscular re-education;Functional mobility training;DME/adaptive equipment instruction;Disease management/prevention;Pain management;Patient/family education;Psychosocial support;Splinting/orthotics;UE/LE Coordination activities;UE/LE Strength taining/ROM;Therapeutic Exercise;Therapeutic Activities;Stair training;Visual/perceptual remediation/compensation;Wheelchair propulsion/positioning PT Recommendation Recommendations for Other Services: Speech consult Follow Up Recommendations: Home health PT Patient destination: Home ((ILF)) Equipment Recommended: None recommended by PT Equipment Details: DME assessment ongoing, recommendations TBD upon discharge  Skilled Therapeutic Intervention Skilled therapeutic intervention initiated upon completion of evaluation.  PT Evaluation Precautions/Restrictions Precautions Precautions: Fall Restrictions Weight Bearing Restrictions: No General Chart Reviewed: Yes Family/Caregiver Present: No  Pain Pain Assessment Pain Assessment: No/denies pain Pain Score:  0-No pain Home Living/Prior Functioning Home Living Lives With: Alone Available Help at Discharge: Family;Available PRN/intermittently Type of Home: Independent living facility Home Access: Level entry Home Layout: One level Bathroom Shower/Tub: Tub/shower unit;Curtain Firefighter: Standard Bathroom Accessibility: Yes How Accessible: Accessible via walker Home Adaptive Equipment: None Additional Comments: Pt reports he has an "emergency string" in his bathroom Prior Function Level of Independence: Independent with transfers;Independent with gait Able to Take Stairs?: Yes Driving: Yes Vocation: Retired Optometrist - History Baseline Vision: Wears glasses for  distance only Patient Visual Report: No change from baseline  Cognition Overall Cognitive Status: Within Functional Limits for tasks assessed Arousal/Alertness: Awake/alert Orientation Level: Oriented X4 Sensation Sensation Light Touch: Appears Intact Proprioception: Appears Intact Additional Comments: Proprioception intact B ankles and great toes. Coordination Gross Motor Movements are Fluid and Coordinated: No Fine Motor Movements are Fluid and Coordinated: No Coordination and Movement Description: Decreased speed and accuracy with rapid, alternating movements Heel Shin Test: Dysmetria noted R LE; patient with difficulty command following for this task Motor  Motor Motor: Abnormal tone Motor - Skilled Clinical Observations: Mild hemiparesis on R side; no clonus noted B LE  Mobility Bed Mobility Bed Mobility: Supine to Sit;Sit to Supine Supine to Sit: HOB flat;5: Supervision Supine to Sit Details: Verbal cues for precautions/safety Sit to Supine: 5: Supervision;HOB flat Sit to Supine - Details: Verbal cues for precautions/safety Transfers Sit to Stand: 4: Min assist;From bed;With armrests;Without upper extremity assist;With upper extremity assist;From chair/3-in-1 Sit to Stand Details: Verbal  cues for precautions/safety;Verbal cues for technique;Verbal cues for safe use of DME/AE Stand to Sit: 4: Min assist;With upper extremity assist;Without upper extremity assist;With armrests;To chair/3-in-1 Stand to Sit Details (indicate cue type and reason): Verbal cues for technique;Verbal cues for precautions/safety;Verbal cues for safe use of DME/AE Locomotion  Ambulation Ambulation: Yes Ambulation/Gait Assistance: 4: Min assist Ambulation Distance (Feet): 180 Feet Assistive device: None Ambulation/Gait Assistance Details: Verbal cues for precautions/safety;Verbal cues for gait pattern;Tactile cues for weight shifting;Tactile cues for posture Ambulation/Gait Assistance Details: Patient instructed in gait training x180' without AD. Patient requires verbal cues for increased R foot clearance, increasing R step length, and upright posture. Patient tends to drift R Gait Gait: Yes Gait Pattern: Impaired Gait Pattern: Step-through pattern;Decreased step length - right;Decreased weight shift to right;Decreased stride length;Decreased dorsiflexion - right;Narrow base of support;Trunk flexed Stairs / Additional Locomotion Stairs: Yes Stairs Assistance: 4: Min guard Stair Management Technique: One rail Left;Step to pattern;Forwards Number of Stairs: 5 Height of Stairs: 6 Corporate treasurer: Yes Wheelchair Assistance: 5: Financial planner Details: Verbal cues for sequencing;Verbal cues for technique;Verbal cues for Engineer, drilling: Both upper extremities Wheelchair Parts Management: Needs assistance Distance: 150  Trunk/Postural Assessment  Cervical Assessment Cervical Assessment: Within Functional Limits Thoracic Assessment Thoracic Assessment: Within Functional Limits Lumbar Assessment Lumbar Assessment: Within Functional Limits Postural Control Postural Control: Within Functional Limits  Balance Balance Balance Assessed:  Yes Static Sitting Balance Static Sitting - Balance Support: No upper extremity supported;Feet supported Static Sitting - Level of Assistance: 5: Stand by assistance Dynamic Sitting Balance Dynamic Sitting - Balance Support: No upper extremity supported;Feet supported;During functional activity Dynamic Sitting - Level of Assistance: 5: Stand by assistance Dynamic Sitting - Balance Activities: Lateral lean/weight shifting;Forward lean/weight shifting;Reaching for objects;Reaching across midline Extremity Assessment  RLE Assessment RLE Assessment: Exceptions to [facility] RLE Strength RLE Overall Strength: Deficits RLE Overall Strength Comments: Grossly 3+/5, except 4/5 knee ext, 4-/5 knee flex LLE Assessment LLE Assessment: Within Functional Limits (4+/5 grossly)  FIM:  FIM - Bed/Chair Transfer Bed/Chair Transfer Assistive Devices: Arm rests Bed/Chair Transfer: 4: Chair or W/C > Bed: Min A (steadying Pt. > 75%);4: Bed > Chair or W/C: Min A (steadying Pt. > 75%) FIM - Locomotion: Wheelchair Distance: 150 Locomotion: Wheelchair: 5: Travels 150 ft or more: maneuvers on rugs and over door sills with supervision, cueing or coaxing FIM - Locomotion: Ambulation Ambulation/Gait Assistance: 4: Min assist Locomotion: Ambulation: 4: Travels 150 ft or more with minimal assistance (Pt.>75%) FIM -  Locomotion: Stairs Locomotion: Building control surveyor: Radio broadcast assistant - 1 Locomotion: Stairs: 2: Up and Down 4 - 11 stairs with minimal assistance (Pt.>75%)   Refer to Care Plan for Long Term Goals  Recommendations for other services: None  Discharge Criteria: Patient will be discharged from PT if patient refuses treatment 3 consecutive times without medical reason, if treatment goals not met, if there is a change in medical status, if patient makes no progress towards goals or if patient is discharged from hospital.  The above assessment, treatment plan, treatment alternatives and goals were discussed and  mutually agreed upon: by patient  Chipper Herb. Ibeth Fahmy, PT, DPT  05/14/2012, 9:47 AM

## 2012-05-14 NOTE — Evaluation (Signed)
Occupational Therapy Assessment and Plan  Patient Details  Name: Kyle Dudley MRN: [id number] Date of Birth: [birth year]  OT Diagnosis: hemiplegia affecting dominant side Rehab Potential: Rehab Potential: Excellent ELOS: 7-10 days   Today's Date: 05/14/2012 Time: 1330-1400 Time Calculation (min): 30 min  Problem List:  Patient Active Problem List  Diagnosis  . Acute ischemic left MCA stroke  . Seizure disorder  . Unspecified essential hypertension    Past Medical History:  Past Medical History  Diagnosis Date  . Seizures    Past Surgical History: No past surgical history on file.  Assessment & Plan Clinical Impression: Patient is a 72 y.o. right-handed male with history of seizure disorder maintained on Keppra and tobacco abuse. Admitted 05/10/2012 with right-sided weakness. 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 MRA of the head with no significant stenosis. 2D echo done revealing EF 55-65% and no wall abnormality. Carotid dopplers without ICA stenosis and incidental findings of bilateral thyroid cysts. 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 He continues with right hemiparesis with balance impairements. Therapy team recommending CIR for progressive therapies.   Patient transferred to CIR on 05/13/2012 .    Patient currently requires min assist with basic self-care skills secondary to decreased coordination and decreased motor planning and impaired balance.  Prior to hospitalization, patient could complete BADL/IADL independently .  Patient will benefit from skilled intervention to increase independence with basic self-care skills and increase level of independence with iADL prior to discharge home.  Anticipate patient will require minimal physical assistance with IADL and follow up home health.  OT - End of Session Activity Tolerance: Tolerates 30+ min activity without fatigue Endurance Deficit: No OT Assessment Rehab  Potential: Excellent OT Plan OT Intensity: Minimum of 1-2 x/day, 45 to 90 minutes OT Frequency: 5 out of 7 days OT Duration/Estimated Length of Stay: 7-10 days OT Treatment/Interventions: Discharge planning;Patient/family education;UE/LE Strength taining/ROM;Therapeutic Exercise;Therapeutic Activities;Neuromuscular re-education;Functional mobility training;UE/LE Coordination activities;Self Care/advanced ADL retraining;Balance/vestibular training OT Recommendation Patient destination: Home Follow Up Recommendations: None Equipment Recommended: Tub/shower seat   Skilled Therapeutic Intervention  1:1 OT session to initiate O.T. evaluation and complete patient education and treatment to address impaired performance in bathing, dressing, gross/fine motor control, and dynamic standing balance.   Patient completed transfers, bathing and dressing with OT assist, prn.  OT Evaluation Precautions/Restrictions  Precautions Precautions: Fall Restrictions Weight Bearing Restrictions: No  General Chart reviewed  Pain Pain Assessment Pain Assessment: No/denies pain Pain Score: 0-No pain  Home Living/Prior Functioning Home Living Lives With: Alone Available Help at Discharge: Family Type of Home: Apartment Home Access: Level entry Home Layout: One level Bathroom Shower/Tub: Forensic scientist: Standard Bathroom Accessibility: Yes How Accessible: Accessible via wheelchair Home Adaptive Equipment: Grab bars in shower Additional Comments: Pt reports he has an "emergency string" in his bathroom IADL History Homemaking Responsibilities: Yes Meal Prep Responsibility: Primary Laundry Responsibility: Primary Cleaning Responsibility: Primary Bill Paying/Finance Responsibility: Primary Shopping Responsibility: Primary Child Care Responsibility: No Current License: Yes Mode of Transportation: Car Occupation: Retired Type of Occupation: Naval architect Leisure and  Hobbies: watches TV, football  Prior Function Level of Independence: Independent with basic ADLs;Independent with homemaking with ambulation Able to Take Stairs?: Yes Driving: Yes Vocation: Retired Leisure: Hobbies-no  ADL ADL Eating: Independent Where Assessed-Eating: Chair Grooming: Minimal assistance;Other (comment) Where Assessed-Grooming: Sitting at sink Upper Body Bathing: Supervision/safety Where Assessed-Upper Body Bathing: Shower Lower Body Bathing: Supervision/safety Where Assessed-Lower Body Bathing:  Shower Upper Body Dressing: Supervision/safety Where Assessed-Upper Body Dressing: Wheelchair Lower Body Dressing: Supervision/safety Where Assessed-Lower Body Dressing: Wheelchair Toileting: Supervision/safety Where Assessed-Toileting: Teacher, adult education: Close supervision Toilet Transfer Method: Surveyor, minerals: Engineer, technical sales: Close supervison Film/video editor: Close supervision Film/video editor Method: Warden/ranger: Information systems manager with back;Grab bars  Vision/Perception Radio broadcast assistant Overall Cognitive Status: Impaired/Different from baseline Arousal/Alertness: Awake/alert Orientation Level: Oriented X4 Attention: Sustained Sustained Attention: Appears intact Selective Attention:  (To be assessed) Memory: Impaired Memory Impairment: Decreased recall of new information Awareness: Impaired Awareness Impairment: Intellectual impairment (of cognitive changes; aware of physical deficits) Problem Solving: Impaired Problem Solving Impairment: Functional complex;Verbal complex Executive Function: Sequencing;Organizing Sequencing: Impaired Sequencing Impairment: Verbal complex;Functional complex Organizing: Impaired Organizing Impairment: Verbal complex;Functional complex Safety/Judgment: Appears intact Comments: Patient demonstrated some anticipatory awareness of physical  deficits with discharge planning.  Sensation Sensation Light Touch: Appears Intact Stereognosis: Impaired by gross assessment;Impaired Detail Stereognosis Impaired Details: Impaired RUE Hot/Cold: Appears Intact Proprioception: Impaired by gross assessment;Impaired Detail Proprioception Impaired Details: Impaired RUE Coordination Gross Motor Movements are Fluid and Coordinated: Yes Fine Motor Movements are Fluid and Coordinated: No  Motor  Motor Motor: Within Functional Limits  Mobility  Bed Mobility Bed Mobility: Rolling Right Rolling Right: 5: Supervision Rolling Right Details: Verbal cues for precautions/safety Supine to Sit: 5: Supervision Supine to Sit Details: Verbal cues for precautions/safety Sit to Supine: 5: Supervision Sit to Supine - Details: Verbal cues for precautions/safety Transfers Sit to Stand: 5: Supervision Sit to Stand Details: Verbal cues for precautions/safety Stand to Sit: 5: Supervision Stand to Sit Details (indicate cue type and reason): Verbal cues for precautions/safety   Trunk/Postural Assessment  Cervical Assessment Cervical Assessment: Within Functional Limits Thoracic Assessment Thoracic Assessment: Within Functional Limits Lumbar Assessment Lumbar Assessment: Within Functional Limits Postural Control Postural Control: Within Functional Limits   Balance Balance Balance Assessed: Yes Standardized Balance Assessment Standardized Balance Assessment: Berg Balance Test Berg Balance Test Sit to Stand: Able to stand without using hands and stabilize independently Standing Unsupported: Able to stand 2 minutes with supervision Sitting with Back Unsupported but Feet Supported on Floor or Stool: Able to sit safely and securely 2 minutes Stand to Sit: Uses backs of legs against chair to control descent Transfers: Able to transfer safely, definite need of hands Standing Unsupported with Eyes Closed: Able to stand 10 seconds with  supervision Standing Ubsupported with Feet Together: Able to place feet together independently and stand for 1 minute with supervision From Standing, Reach Forward with Outstretched Arm: Can reach forward >12 cm safely (5") From Standing Position, Pick up Object from Floor: Able to pick up shoe, needs supervision From Standing Position, Turn to Look Behind Over each Shoulder: Looks behind one side only/other side shows less weight shift Turn 360 Degrees: Able to turn 360 degrees safely but slowly Standing Unsupported, Alternately Place Feet on Step/Stool: Able to stand independently and complete 8 steps >20 seconds Standing Unsupported, One Foot in Front: Loses balance while stepping or standing Standing on One Leg: Able to lift leg independently and hold equal to or more than 3 seconds Total Score: 38  Extremity/Trunk Assessment RUE Assessment RUE Assessment: Exceptions to [facility] RUE Strength RUE Overall Strength: Deficits Right Forearm Pronation: 3-/5 Right Forearm Supination: 3-/5 Right Wrist Flexion: 3-/5 Right Wrist Extension: 3-/5 Right Wrist Radial Deviation: 3-/5 Right Wrist Ulnar Deviation: 3-/5 Gross Grasp: Impaired LUE Assessment LUE Assessment: Within Functional Limits  FIM: see flowsheet     Refer to Care Plan for Long Term Goals  Recommendations for other services: None  Discharge Criteria: Patient will be discharged from OT if patient refuses treatment 3 consecutive times without medical reason, if treatment goals not met, if there is a change in medical status, if patient makes no progress towards goals or if patient is discharged from hospital.  The above assessment, treatment plan, treatment alternatives and goals were discussed and mutually agreed upon: by patient  Georgeanne Nim 05/14/2012, 4:01 PM

## 2012-05-15 ENCOUNTER — Encounter (HOSPITAL_COMMUNITY): Payer: Medicare Other | Admitting: Occupational Therapy

## 2012-05-15 NOTE — Progress Notes (Signed)
Subjective/Complaints: Therapy went well. No specific new complaints.  A 12 point review of systems has been performed and if not noted above is otherwise negative.   Objective: Vital Signs: Blood pressure 120/75, pulse 70, temperature 97.3 F (36.3 C), temperature source Oral, resp. rate 20, height 5\' 5"  (1.651 m), weight 94.6 kg (208 lb 8.9 oz), SpO2 97.00%. No results found. No results found for this basename: WBC, HGB, HCT, PLT,  in the last 72 hours No results found for this basename: NA, K, CL, CO, GLUCOSE, BUN, CREATININE, CALCIUM,  in the last 72 hours CBG (last 3)   Recent Labs  05/12/12 2149 05/13/12 0725 05/13/12 1148  GLUCAP 85 84 115*    Wt Readings from Last 3 Encounters:  05/13/12 94.6 kg (208 lb 8.9 oz)  05/11/12 48 kg (105 lb 13.1 oz)    Physical Exam:  Constitutional: He is oriented to person, place, and time. He appears well-developed.  Thin male  HENT: Dentition poor. Oral mucosa pink and moist.  Head: Normocephalic and atraumatic.  Eyes: Pupils are equal, round, and reactive to light.  Cardiovascular: Normal rate and regular rhythm. No murmurs  Pulmonary/Chest: Effort normal and breath sounds normal. No wheezes or rales.  Abdominal: Soft. Bowel sounds are normal. Non distended  Musculoskeletal: He exhibits no edema and no tenderness.  Neurological: He is alert and oriented to person, place, and time. A little slow to arouse this early am. Speech is slightly diminished. Reasonable but basic insight and awareness. Follows commands without difficulty. RUE weakness with decreased coordination. + pronator drift and diminished FMC. RUE is 4/5. RLE is grossly 4+/5 with diminished FMC. No sensory deficits.  Skin: Skin is warm and dry.    Assessment/Plan: 1. Functional deficits secondary to left centrum semiovale thrombotic infarct which require 3+ hours per day of interdisciplinary therapy in a comprehensive inpatient rehab setting. Physiatrist is providing  close team supervision and 24 hour management of active medical problems listed below. Physiatrist and rehab team continue to assess barriers to discharge/monitor patient progress toward functional and medical goals. FIM: FIM - Bathing Bathing Steps Patient Completed: Chest;Right Arm;Left Arm;Abdomen;Front perineal area;Buttocks;Right upper leg;Left upper leg;Right lower leg (including foot);Left lower leg (including foot) Bathing: 5: Supervision: Safety issues/verbal cues  FIM - Upper Body Dressing/Undressing Upper body dressing/undressing: 0: Wears gown/pajamas-no public clothing FIM - Lower Body Dressing/Undressing Lower body dressing/undressing steps patient completed: Thread/unthread right underwear leg;Thread/unthread left underwear leg;Pull underwear up/down;Thread/unthread right pants leg;Thread/unthread left pants leg;Pull pants up/down;Fasten/unfasten pants;Don/Doff right sock;Don/Doff left sock Lower body dressing/undressing: 4: Min-Patient completed 75 plus % of tasks     FIM - Diplomatic Services operational officer Devices: Grab bars Toilet Transfers: 5-To toilet/BSC: Supervision (verbal cues/safety issues);5-From toilet/BSC: Supervision (verbal cues/safety issues)  FIM - Press photographer Assistive Devices: Arm rests Bed/Chair Transfer: 7: Supine > Sit: No assist;7: Sit > Supine: No assist  FIM - Locomotion: Wheelchair Distance: 150 Locomotion: Wheelchair: 5: Travels 150 ft or more: maneuvers on rugs and over door sills with supervision, cueing or coaxing FIM - Locomotion: Ambulation Ambulation/Gait Assistance: 4: Min assist Locomotion: Ambulation: 4: Travels 150 ft or more with minimal assistance (Pt.>75%)  Comprehension Comprehension Mode: Auditory Comprehension: 5-Follows basic conversation/direction: With extra time/assistive device  Expression Expression Mode: Verbal Expression: 5-Expresses basic needs/ideas: With no assist  Social  Interaction Social Interaction: 6-Interacts appropriately with others with medication or extra time (anti-anxiety, antidepressant).  Problem Solving Problem Solving: 4-Solves basic 75 - 89% of the time/requires cueing  10 - 24% of the time  Memory Memory: 4-Recognizes or recalls 75 - 89% of the time/requires cueing 10 - 24% of the time  Medical Problem List and Plan:  1. DVT Prophylaxis/Anticoagulation: Pharmaceutical: Lovenox  2. Pain Management: N/A  3. Mood: Offer ego support as with concerns/anxiety regarding loss of independence. LCSW to follow for evaluation.  4. Neuropsych: This patient is capable of making decisions on his/her own behalf.  5. H/o seizures: On keppra bid.  6. HTN: Blood pressures remain elevated. Started on norvasc Friday which seems to have helped.  LOS (Days) 2 A FACE TO FACE EVALUATION WAS PERFORMED  SWARTZ,ZACHARY T 05/15/2012 8:07 AM

## 2012-05-15 NOTE — Progress Notes (Signed)
Occupational Therapy Session Note  Patient Details  Name: Kyle Dudley MRN: [id number] Date of Birth: [birth year]  Today's Date: 05/15/2012 Time: 4696-2952 Time Calculation (min): 45 min  Short Term Goals: Week 1:  OT Short Term Goal 1 (Week 1): Patient will complete all functional trasnfers with modified independence OT Short Term Goal 2 (Week 1): Patient will demo ability to perform upper extremity home exercises to improve function of right UE, independently OT Short Term Goal 3 (Week 1): Patient will complete lower body dressing using AE, prn, with supervision OT Short Term Goal 4 (Week 1): Patient will complete selected light homemaking tasks with supervision for safety  Skilled Therapeutic Interventions/Progress Updates:  Tearful during session due to concern related to CVA, "I used to do everything for myself".  Encouragement provided then patient engaged in self care retraining to include sponge bath at sink (declined shower today), dressing and grooming.  Patient declined to bathe peri area and buttocks stating, I took a shower yesterday so I clean and I don't have enough clean underware her yet (declined to donn the one clean pair that he had).  Patient ambulated with HHA to sink and stood to wash upper body and stood to brush teeth.  After self care session, HHA to recliner with all items within reach.  Therapy Documentation Precautions:  Precautions Precautions: Fall Restrictions Weight Bearing Restrictions: No Pain: Denies pain ADL: See FIM for current functional status  Therapy/Group: Individual Therapy  Addison Freimuth 05/15/2012, 9:41 AM

## 2012-05-16 ENCOUNTER — Inpatient Hospital Stay (HOSPITAL_COMMUNITY): Payer: Medicare Other

## 2012-05-16 ENCOUNTER — Inpatient Hospital Stay (HOSPITAL_COMMUNITY): Payer: Medicare Other | Admitting: *Deleted

## 2012-05-16 DIAGNOSIS — I69993 Ataxia following unspecified cerebrovascular disease: Secondary | ICD-10-CM

## 2012-05-16 DIAGNOSIS — I6330 Cerebral infarction due to thrombosis of unspecified cerebral artery: Secondary | ICD-10-CM

## 2012-05-16 LAB — GLUCOSE, CAPILLARY: Glucose-Capillary: 155 mg/dL — ABNORMAL HIGH (ref 70–99)

## 2012-05-16 LAB — CBC WITH DIFFERENTIAL/PLATELET
Basophils Relative: 1 % (ref 0–1)
Eosinophils Absolute: 0.4 10*3/uL (ref 0.0–0.7)
Eosinophils Relative: 6 % — ABNORMAL HIGH (ref 0–5)
Hemoglobin: 15 g/dL (ref 13.0–17.0)
MCH: 31.5 pg (ref 26.0–34.0)
MCHC: 34.6 g/dL (ref 30.0–36.0)
MCV: 91.2 fL (ref 78.0–100.0)
Monocytes Absolute: 0.7 10*3/uL (ref 0.1–1.0)
Monocytes Relative: 11 % (ref 3–12)
Neutrophils Relative %: 45 % (ref 43–77)

## 2012-05-16 LAB — COMPREHENSIVE METABOLIC PANEL
Albumin: 3.5 g/dL (ref 3.5–5.2)
BUN: 18 mg/dL (ref 6–23)
Calcium: 9.4 mg/dL (ref 8.4–10.5)
Creatinine, Ser: 0.74 mg/dL (ref 0.50–1.35)
GFR calc Af Amer: >90 mL/min (ref >90)
Potassium: 4 mEq/L (ref 3.5–5.1)
Total Protein: 6.6 g/dL (ref 6.0–8.3)

## 2012-05-16 MED ORDER — ADULT MULTIVITAMIN W/MINERALS CH
1.0000 | ORAL_TABLET | Freq: Every day | ORAL | Status: DC
  Administered 2012-05-16 – 2012-05-21 (×6): 1 via ORAL
  Filled 2012-05-16 (×7): qty 1

## 2012-05-16 NOTE — Progress Notes (Signed)
INITIAL NUTRITION ASSESSMENT  Pt meets criteria for SEVERE MALNUTRITION in the context of chronic illness as evidenced by severe muscle and fat wasting.  DOCUMENTATION CODES Per approved criteria  -Severe malnutrition in the context of chronic illness -Underweight   INTERVENTION: 1. Continue Ensure Complete po BID, each supplement provides 350 kcal and 13 grams of protein. 2. Add MVI daily 3. PLEASE OBTAIN ACCURATE WEIGHT 4. RD to continue to follow nutrition care plan  NUTRITION DIAGNOSIS: Unintentional weight loss related to poor appetite as evidenced by 9% weight loss. Ongoing.  Goal: Pt to meet >/= 90% of their estimated nutrition needs.   Monitor:  PO intake, supplement acceptance, weight trend, labs  Reason for Assessment: Health History  72 y.o. male  Admitting Dx: Acute ischemic left MCA stroke  ASSESSMENT: Admitted s/p left centrum semiovale infarct. CIR   Nutrition hx taken from pt during acute hospitalization; pt confirmed weight loss PTA, but is unsure of time frame. 24 hr recall reviewed, pt consuming only 2 meals per day which he prepares. Breakfast is usually an egg and grits, Supper is a piece of protein such as a pork chop and mashed potatoes. Pt does like Ensure but does not drink consistently. Currently drinking Ensure Complete during interview.  Pt was most recently 105 lb. Current weight is inaccurate at 208 lb - asked RN to obtain new weight. Pt appears thin with muscle wasting evident.  Height: Ht Readings from Last 1 Encounters:  05/13/12 5\' 5"  (1.651 m)    Weight: 105 lb on 4/16   Ideal Body Weight: 59 kg  % Ideal Body Weight: 81%  Wt Readings from Last 10 Encounters:  05/13/12 208 lb 8.9 oz (94.6 kg)  05/11/12 105 lb 13.1 oz (48 kg)    Usual Body Weight: 115 lb   % Usual Body Weight: 91%  BMI:  Body mass index is 34.71 kg/(m^2). Underweight  Estimated Nutritional Needs: Kcal: 1500-1700 Protein: 75-90 grams Fluid: > 1.5  L/day  Skin: no issues noted  Nutrition Focused Physical Exam:  Subcutaneous Fat:  Orbital Region: WNL Upper Arm Region: moderate wasting Thoracic and Lumbar Region: severe wasting  Muscle:  Temple Region: mild wasting Clavicle Bone Region: severe wasting Clavicle and Acromion Bone Region: severe wasting Scapular Bone Region: mild wasting Dorsal Hand: NA Patellar Region: severe wasting Anterior Thigh Region: severe wasting Posterior Calf Region: moderate wasting  Edema: not present    Diet Order: General Meal Completion: 75% per pt report  EDUCATION NEEDS: -No education needs identified at this time   Intake/Output Summary (Last 24 hours) at 05/16/12 1444 Last data filed at 05/16/12 0800  Gross per 24 hour  Intake    480 ml  Output      0 ml  Net    480 ml    Last BM: 4/20  Labs:   Recent Labs Lab 05/10/12 1845 05/10/12 1911 05/16/12 0723  NA 141 143 136  K 3.7 3.6 4.0  CL 104 105 100  CO2 25  --  27  BUN 7 5* 18  CREATININE 0.65 0.60 0.74  CALCIUM 9.8  --  9.4  GLUCOSE 103* 104* 91    CBG (last 3)  No results found for this basename: GLUCAP,  in the last 72 hours  Scheduled Meds: . amLODipine  2.5 mg Oral Daily  . aspirin  81 mg Oral Daily  . enoxaparin (LOVENOX) injection  40 mg Subcutaneous Q24H  . feeding supplement  237 mL Oral BID BM  .  levETIRAcetam  1,000 mg Oral BID    Continuous Infusions:   Past Medical History  Diagnosis Date  . Seizures     No past surgical history on file.  Jarold Motto MS, RD, LDN Pager: [phone] After-hours pager: [phone]

## 2012-05-16 NOTE — Progress Notes (Signed)
[facility] Individual Statement of Services  Patient Name:  Kyle Dudley  Date:  05/16/2012  [facility] the [facility].  Our goal is to provide you with an individualized program based on your diagnosis and situation, designed to meet your specific needs.  With this comprehensive rehabilitation program, you will be expected to participate in at least 3 hours of rehabilitation therapies Monday-Friday, with modified therapy programming on the weekends.  Your rehabilitation program will include the following services:  Physical Therapy (PT), Occupational Therapy (OT), Speech Therapy (ST), 24 hour per day rehabilitation nursing, Therapeutic Recreaction (TR), Case Management (Social Worker), Rehabilitation Medicine, Nutrition Services and Pharmacy Services  Weekly team conferences will be held on Wednesdays to discuss your progress.  Your Social Worker will talk with you frequently to get your input and to update you on team discussions.  Team conferences with you and your family in attendance may also be held.  Expected length of stay: 7-10 days  Overall anticipated outcome: Modified Independent  Depending on your progress and recovery, your program may change. Your Social Worker will coordinate services and will keep you informed of any changes. Your Social Worker's name and contact numbers are listed  below.  The following services may also be recommended but are not provided by the [facility]:   Driving Evaluations  Home Health Rehabiltiation Services  Outpatient Rehabilitatation [facility]  Vocational Rehabilitation   Arrangements will be made to provide these services after discharge if needed.  Arrangements include referral to agencies that provide these services.  Your insurance has been verified to be:  Medicare and Medicaid Your primary doctor is:  TBD (incorrect on MA card)  Pertinent information will be shared with your  doctor and your insurance company.  Social Worker:  [place], [place] [phone] or ([phone]  Information discussed with and copy given to patient by: Amada Jupiter, 05/16/2012, 5:07 PM

## 2012-05-16 NOTE — Progress Notes (Signed)
Social Work  Social Work Assessment and Plan  Patient Details  Name: Kyle Dudley MRN: [id number] Date of Birth: 05-Oct-1940  Today's Date: 05/16/2012  Problem List:  Patient Active Problem List  Diagnosis  . Acute ischemic left MCA stroke  . Seizure disorder  . Unspecified essential hypertension   Past Medical History:  Past Medical History  Diagnosis Date  . Seizures    Past Surgical History: No past surgical history on file. Social History:  reports that he has been smoking Cigarettes.  He has a 27.5 pack-year smoking history. He does not have any smokeless tobacco history on file. He reports that he does not drink alcohol. His drug history is not on file.  Family / Support Systems Marital Status: Widow/Widower How Long?: "... a long, long time ago..."  Patient Roles: Other (Comment) (uncle, brother) Spouse/Significant Other: NA Children: NA Other Supports: nephew, Kyle Dudley @ [phone] and neice, Kyle Dudley @ (C) [phone] - both live locally and visit him often.   Anticipated Caregiver: niece & nephew to provide intermittent assist.   Ability/Limitations of Caregiver: both are working Caregiver Availability: Intermittent Family Dynamics: pt describes local family as very supportive and denies any concerns about whether they can assist him with transportation needs after d/c.  Social History Preferred language: English Religion: Baptist Cultural Background: NA Education: HS Read: Yes Write: Yes Employment Status: Retired Date Retired/Disabled/Unemployed: 2005 Fish farm manager Issues: none Guardian/Conservator: none - MD indicates pt able to make decision on his own behalf   Abuse/Neglect Physical Abuse: Denies Verbal Abuse: Denies Sexual Abuse: Denies Exploitation of patient/patient's resources: Denies Self-Neglect: Denies  Emotional Status Pt's affect, behavior adn adjustment status: Pt very pleasant and talkative, however, a little  difficult to understand at times - uncertain if result from stroke or premorbid "slur".  Laughs easily with the SW throughout interview and denies any significant emotional distress.  No s/s of depression or anxiety noted/ reported but will monitor. Recent Psychosocial Issues: Notes his girlfriend of several years passed away within the past coupld of years. Pyschiatric History: None Substance Abuse History: None per pt  Patient / Family Perceptions, Expectations & Goals Pt/Family understanding of illness & functional limitations: Pt able to to probide basic information about having had a stroke and his presenting symptoms.   Notes he takes meds for HTN and seizures.  General understanding of his current functional deficits. Premorbid pt/family roles/activities: PTA pt living alone, still driving and "VERY" independent.  "If I want to go somewhere, do something, I did it." Anticipated changes in roles/activities/participation: Little change anticipated as pt is making excellent gains, however, will ikely be restricted with driving.  Notes his neice and nephew can assist with this. Pt/family expectations/goals: Pt hopes to return home by end of the week and be able to "do for myself".  Community Resources [employer]: None Premorbid Home Care/DME Agencies: None Transportation available at discharge: yes Resource referrals recommended: Support group (specify) (Stroke Groups)  Discharge Planning Living Arrangements: Alone Support Systems: Other relatives;Friends/neighbors Type of Residence: Private residence Insurance Resources: Medicare;Medicaid (specify county) Medical sales representative) Financial Resources: Social Doctor, hospital Screen Referred: No Living Expenses: Psychologist, sport and exercise Management: Patient Do you have any problems obtaining your medications?: No Home Management: pt Patient/Family Preliminary Plans: Pt plans to return to his own apt and independent Social Work Anticipated Follow Up  Needs: HH/OP Expected length of stay: 7-10 days  Clinical Impression Very pleasant gentleman here after stroke, however, making excellent recovery and team feels will likely  reach mod i goals and be able to return to his own apt.  Denies any significant emotional distress - will monitor.  Local family supportive.  Kyle Dudley 05/16/2012, 5:40 PM

## 2012-05-16 NOTE — Progress Notes (Signed)
Occupational Therapy Session Note  Patient Details  Name: Kyle Dudley MRN: [id number] Date of Birth: [birth year]  Today's Date: 05/16/2012 Time: 4098-1191 Time Calculation (min): 57 min  Short Term Goals: Week 1:  OT Short Term Goal 1 (Week 1): Patient will complete all functional trasnfers with modified independence OT Short Term Goal 2 (Week 1): Patient will demo ability to perform upper extremity home exercises to improve function of right UE, independently OT Short Term Goal 3 (Week 1): Patient will complete lower body dressing using AE, prn, with supervision OT Short Term Goal 4 (Week 1): Patient will complete selected light homemaking tasks with supervision for safety  Skilled Therapeutic Interventions/Progress Updates:    Therapy session focused on ADL retraining, dynamic standing balance, and functional transfers. Patient stood at sink with supervision to complete oral care. Patient ambulated from bed to toilet with HHA. Patient initiated use of RUE during self-care tasks as he used it to apply deodorant and use press top body spray. OT taught patient to tech of buttoning front button shirt in lap partially then donning overhead to like a pullover shirt. Patient able to carryover tech then finish buttoning top 2 buttons. Patient required increased time with buttons. Patient able to flex forward to don shoes then required assist to tie shoes. Patient consistently problem solved to position self at sink during sponge bath and used BUE to lock w/c brakes. Patient declined shower on this date. Patient required cues to take 3 rest breaks during self-care tasks. Patient completed sit<>stand transfers from w/c with supervision.   Therapy Documentation Precautions:  Precautions Precautions: Fall Restrictions Weight Bearing Restrictions: No Pain: No c/o pain during therapy session  Other Treatments: Treatments Neuromuscular Facilitation: Right;Upper Extremity;Lower Extremity;Forced use;Activity  to increase motor control;Activity to increase grading;Activity to increase sustained activation (Tandem sit<>stands) Weight Bearing Technique Weight Bearing Technique: Yes RUE Weight Bearing Technique: Prone;Quadruped LUE Weight Bearing Technique: Prone;Quadruped Response to Weight Bearing Technique: Patient performed 2 rounds of quadruped, lasting approximately 3-4 minutes each. Patient performed alternating UE reaching x20 with emphasis on maintaining weight shifted anteriorly to promote increased weight bearing through B UE. During second round, patient performed L UE movements only to increase weight bearing through R UE.  See FIM for current functional status  Therapy/Group: Individual Therapy  Daneil Dan 05/16/2012, 12:25 PM

## 2012-05-16 NOTE — Progress Notes (Signed)
Patient ID: Kyle Dudley, male   DOB: June 29, 1940, 72 y.o.   MRN: [id number] Subjective/Complaints: 72 y.o. right-handed male with history of seizure disorder (maintained on Keppra) and tobacco abuse. Admitted 05/10/2012 with right-sided weakness. 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 MRA of the head with no significant stenosis. 2D echo done revealing EF 55-65% and no wall abnormality. Carotid dopplers without ICA stenosis and incidental findings of bilateral thyroid cysts  Therapy went well. No specific new complaints.   A 12 point review of systems has been performed and if not noted above is otherwise negative.   Objective: Vital Signs: Blood pressure 108/72, pulse 81, temperature 98 F (36.7 C), temperature source Oral, resp. rate 18, height 5\' 5"  (1.651 m), weight 94.6 kg (208 lb 8.9 oz), SpO2 99.00%. No results found. No results found for this basename: WBC, HGB, HCT, PLT,  in the last 72 hours No results found for this basename: NA, K, CL, CO, GLUCOSE, BUN, CREATININE, CALCIUM,  in the last 72 hours CBG (last 3)   Recent Labs  05/13/12 1148  GLUCAP 115*    Wt Readings from Last 3 Encounters:  05/13/12 94.6 kg (208 lb 8.9 oz)  05/11/12 48 kg (105 lb 13.1 oz)    Physical Exam:  Constitutional: He is oriented to person, place, and time. He appears well-developed.  Thin male  HENT: Dentition poor. Oral mucosa pink and moist.  Head: Normocephalic and atraumatic.  Eyes: Pupils are equal, round, and reactive to light.  Cardiovascular: Normal rate and regular rhythm. No murmurs  Pulmonary/Chest: Effort normal and breath sounds normal. No wheezes or rales.  Abdominal: Soft. Bowel sounds are normal. Non distended  Musculoskeletal: He exhibits no edema and no tenderness.  Neurological: He is alert and oriented to person, place, and time. A little slow to arouse this early am. Speech is slightly diminished. Reasonable but basic insight and awareness.  Follows commands without difficulty. RUE weakness 3-/5 Delt,Bi,Tri, 2- in grip and finger ext with decreased coordination. + pronator drift and diminished FMC. RUE is 4/5. RLE is grossly 4+/5 with diminished FMC. No sensory deficits.  Skin: Skin is warm and dry.    Assessment/Plan: 1. Functional deficits secondary to left centrum semiovale thrombotic infarct which require 3+ hours per day of interdisciplinary therapy in a comprehensive inpatient rehab setting. Physiatrist is providing close team supervision and 24 hour management of active medical problems listed below. Physiatrist and rehab team continue to assess barriers to discharge/monitor patient progress toward functional and medical goals. FIM: FIM - Bathing Bathing Steps Patient Completed: Chest;Right Arm;Left Arm;Abdomen;Front perineal area;Buttocks;Right upper leg;Left upper leg;Right lower leg (including foot);Left lower leg (including foot) Bathing: 5: Supervision: Safety issues/verbal cues  FIM - Upper Body Dressing/Undressing Upper body dressing/undressing steps patient completed: Thread/unthread right sleeve of front closure shirt/dress;Thread/unthread left sleeve of front closure shirt/dress;Pull shirt around back of front closure shirt/dress Upper body dressing/undressing: 4: Min-Patient completed 75 plus % of tasks FIM - Lower Body Dressing/Undressing Lower body dressing/undressing steps patient completed: Thread/unthread left pants leg;Pull pants up/down;Fasten/unfasten pants;Thread/unthread right pants leg;Don/Doff right sock;Don/Doff left sock Lower body dressing/undressing: 4: Steadying Assist     FIM - Diplomatic Services operational officer Devices: Grab bars Toilet Transfers: 5-To toilet/BSC: Supervision (verbal cues/safety issues);5-From toilet/BSC: Supervision (verbal cues/safety issues)  FIM - Press photographer Assistive Devices: Arm rests Bed/Chair Transfer: 7: Supine > Sit: No  assist;7: Sit > Supine: No assist  FIM - Locomotion: Wheelchair Distance:  150 Locomotion: Wheelchair: 5: Travels 150 ft or more: maneuvers on rugs and over door sills with supervision, cueing or coaxing FIM - Locomotion: Ambulation Ambulation/Gait Assistance: 4: Min assist Locomotion: Ambulation: 4: Travels 150 ft or more with minimal assistance (Pt.>75%)  Comprehension Comprehension Mode: Auditory Comprehension: 5-Follows basic conversation/direction: With extra time/assistive device  Expression Expression Mode: Verbal Expression: 5-Expresses basic needs/ideas: With no assist  Social Interaction Social Interaction: 6-Interacts appropriately with others with medication or extra time (anti-anxiety, antidepressant).  Problem Solving Problem Solving: 4-Solves basic 75 - 89% of the time/requires cueing 10 - 24% of the time  Memory Memory: 4-Recognizes or recalls 75 - 89% of the time/requires cueing 10 - 24% of the time  Medical Problem List and Plan:  1. DVT Prophylaxis/Anticoagulation: Pharmaceutical: Lovenox  2. Pain Management: N/A  3. Mood: Offer ego support as with concerns/anxiety regarding loss of independence. LCSW to follow for evaluation.  4. Neuropsych: This patient is capable of making decisions on his/her own behalf.  5. H/o seizures: On keppra bid.  6. HTN: Blood pressures remain elevated. Started on norvasc Friday which seems to have helped.  LOS (Days) 3 A FACE TO FACE EVALUATION WAS PERFORMED  Erick Colace 05/16/2012 7:33 AM

## 2012-05-16 NOTE — Progress Notes (Signed)
Patient information reviewed and entered into eRehab system by Daun Rens, RN, CRRN, PPS Coordinator.  Information including medical coding and functional independence measure will be reviewed and updated through discharge.     Per nursing patient was given "Data Collection Information Summary for Patients in Inpatient Rehabilitation Facilities with attached "Privacy Act Statement-Health Care Records" upon admission.  

## 2012-05-16 NOTE — Progress Notes (Signed)
Physical Therapy Session Note  Patient Details  Name: Kyle Dudley MRN: [id number] Date of Birth: 1940/12/21  Today's Date: 05/16/2012 Time: 1030-1130 and 1345-1430 Time Calculation (min): 60 min and 45 min  Short Term Goals: Week 1:  PT Short Term Goal 1 (Week 1): STGs=LTGs  Skilled Therapeutic Interventions/Progress Updates:    AM Session: Patient received sitting in recliner. This session focused on gait training (with and without AD), stair negotiation, and R UE/LE NMR. Patient returned to room and left seated in recliner with all needs within reach.  PM Session: Patient received sitting in recliner. This session focused on high level ambulation in controlled environment and gait training in community environment and outside (on uneven surfaces, inclines/declines, concrete surfaces, etc.) with SBQC. Patient instructed in gait training outside >150'x2 with [facility] and min guard on concrete surfaces, including inclines, declines, slanted surface, brick surfaces, etc. Patient continues to require verbal cues for proper sequencing with SBQC, but demonstrate improved gait and less sway and scissoring with use of SBQC. While outside, patient negotiated 6 steps with R handrail and SBQC in L hand, requires verbal cues for proper sequencing of SBQC on steps. Patient demonstrates improved R foot clearance, only one instance in all outdoor ambulation of R foot drag.  Patient performed high level ambulation in controlled environment: tandem walking x30' with R handheld and min assist (one mild LOB anteriorly, requiring min assist to correct) and toe walking x30' with L handrail and min assist. Patient returned to room and left seated in recliner with all needs within reach.  Therapy Documentation Precautions:  Precautions Precautions: Fall Restrictions Weight Bearing Restrictions: No Pain: Pain Assessment Pain Assessment: No/denies pain Pain Score: 0-No pain Locomotion : Ambulation Ambulation:  Yes Ambulation/Gait Assistance: 4: Min assist Ambulation Distance (Feet): 160 Feet x2 without AD (Min Assist), 200' x1 with SBQC and min guard. Assistive device: None;Small based quad cane Ambulation/Gait Assistance Details: Verbal cues for precautions/safety;Verbal cues for gait pattern;Tactile cues for weight shifting;Tactile cues for posture Ambulation/Gait Assistance Details: Patient instructed in gait training x180' without AD. Patient requires verbal cues for increased R foot clearance, increasing R step length, and upright posture. Patient drifts R. Patient instructed in gait training x200' in controlled environment with [facility] and min guard. Patient demonstrates decerased lateral trunk sway and increased R foot clearance and step length with use of SBQC. Patient requires verbal cues to look ahead of him and not at the floor. Gait Gait: Yes Gait Pattern: Impaired Gait Pattern: Step-through pattern;Decreased step length - right;Decreased weight shift to right;Decreased stride length;Decreased dorsiflexion - right;Narrow base of support;Trunk flexed High Level Ambulation High Level Ambulation: Side stepping;Backwards walking Side Stepping: x30' each direction with min guard, no LOB Backwards Walking: 81' x2 with min guard, no LOB Stairs / Additional Locomotion Stairs: Yes Stairs Assistance: 4: Min guard Stair Management Technique: One rail Left;Step to pattern;Forwards Number of Stairs: 10 Height of Stairs: 6  Other Treatments: Treatments Neuromuscular Facilitation: Right;Upper Extremity;Lower Extremity;Forced use;Activity to increase motor control;Activity to increase grading;Activity to increase sustained activation (3 sets x5 reps tandem sit<>stands with L LE anterior to R LE to increase weight bearing through R LE. Patient with L UE across chest to facilitate forced use in pushing up with R UE) Weight Bearing Technique Weight Bearing Technique: Yes RUE Weight Bearing Technique:  Prone;Quadruped LUE Weight Bearing Technique: Prone;Quadruped Response to Weight Bearing Technique: Patient performed 2 rounds of quadruped, lasting approximately 3-4 minutes each. Patient performed alternating UE reaching x20 with emphasis  on maintaining weight shifted anteriorly to promote increased weight bearing through B UE. During second round, patient performed L UE movements only to increase weight bearing through R UE. Patient performed 1 set x5 reps of R LE forward step ups (to increase strength on R LE) with R UE support on handrail, patient progressed to 1 set x5 reps without UE support, patient requires min guard for activity.  See FIM for current functional status  Therapy/Group: Individual Therapy  Chipper Herb. Damyia Strider, PT, DPT  05/16/2012, 12:17 PM

## 2012-05-16 NOTE — Progress Notes (Signed)
Speech Language Pathology Daily Session Note  Patient Details  Name: Kyle Dudley MRN: [id number] Date of Birth: January 11, 1941  Today's Date: 05/16/2012 Time: 4098-1191 Time Calculation (min): 30 min  Short Term Goals: Week 1: SLP Short Term Goal 1 (Week 1): Patient will demonstrate complex problem solving with supervision level verbal cues. SLP Short Term Goal 2 (Week 1): Patient will utilize word finding strategies during conversational expression with min verbal cues. SLP Short Term Goal 3 (Week 1): Patient will utilize external aids to assist with recall of daily informaiton with supervision verbal cues.  Skilled Therapeutic Interventions: Skilled treatment session focused on cognitive goals. SLP facilitated by providing Min cues for problem solving as related to time calculations and schedule reading. With Min verbal cues to use his external aid, pt was able to recall his three medications. Continue plan of care.   FIM:  Comprehension Comprehension Mode: Auditory Comprehension: 5-Understands basic 90% of the time/requires cueing < 10% of the time Expression Expression Mode: Verbal Expression: 5-Expresses basic needs/ideas: With no assist Social Interaction Social Interaction: 6-Interacts appropriately with others with medication or extra time (anti-anxiety, antidepressant). Problem Solving Problem Solving: 4-Solves basic 75 - 89% of the time/requires cueing 10 - 24% of the time Memory Memory: 3-Recognizes or recalls 50 - 74% of the time/requires cueing 25 - 49% of the time  Pain Pain Assessment Pain Assessment: No/denies pain Pain Score: 0-No pain  Therapy/Group: Individual Therapy   Maxcine Ham, M.A. CF-SLP  Maxcine Ham 05/16/2012, 11:09 AM

## 2012-05-17 ENCOUNTER — Inpatient Hospital Stay (HOSPITAL_COMMUNITY): Payer: Medicare Other | Admitting: Speech Pathology

## 2012-05-17 ENCOUNTER — Inpatient Hospital Stay (HOSPITAL_COMMUNITY): Payer: Medicare Other

## 2012-05-17 ENCOUNTER — Inpatient Hospital Stay (HOSPITAL_COMMUNITY): Payer: Medicare Other | Admitting: Physical Therapy

## 2012-05-17 ENCOUNTER — Inpatient Hospital Stay (HOSPITAL_COMMUNITY): Payer: Medicare Other | Admitting: *Deleted

## 2012-05-17 NOTE — Progress Notes (Signed)
Physical Therapy Session Note  Patient Details  Name: Kyle Dudley MRN: [id number] Date of Birth: Feb 19, 1940  Today's Date: 05/17/2012 Time: 11:20-12:05 ( )  Short Term Goals: Week 1:  PT Short Term Goal 1 (Week 1): STGs=LTGs  Skilled Therapeutic Interventions/Progress Updates:  Tx focused on gait training with NBQC, dynamic balance training with no device, and strengthening for RLE activation.  Transfers min-guard for safety. Gait training 1x150' with NBQC and 1x150' with no device and Min A for steadying. Pt respondd well to cues for increasing step width as he takes narrow, and sometimes crossing midline steps. Pt challenged with changes in speed in controlled environment.   Dynamic balance with no device:  Lateral stepping 2x30' with min A, increased time required and cues for posture.  Step taps to 4" step with R/L LE x10 each, alternating x10 each and step-up onto forward and backwards off x10 leading with either R/L and Min A, no LOB. Pt would be challenged more with this with increased speed.  Ball toss in/outside BOS, ball toss with challenge to read activity and perform phrase on ball, ball toss while forward and retro walking. All tasks with Min A for occasional steadying needed.   Nustep x60min, lvel 5 with bil LE and R UE only for strengthening and practice sustained grip.   Pt returned to room with all needs in reach.        Therapy Documentation Precautions:  Precautions Precautions: Fall Restrictions Weight Bearing Restrictions: No    Pain: Pain Assessment Pain Assessment: No/denies pain  See FIM for current functional status  Therapy/Group: Individual Therapy  Clydene Laming, PT, DPT   05/17/2012, 11:34 AM

## 2012-05-17 NOTE — Progress Notes (Signed)
Speech Language Pathology Daily Session Note  Patient Details  Name: Kyle Dudley MRN: [id number] Date of Birth: July 23, 1940  Today's Date: 05/17/2012 Time: 4098-1191 Time Calculation (min): 26 min  Short Term Goals: Week 1: SLP Short Term Goal 1 (Week 1): Patient will demonstrate complex problem solving with supervision level verbal cues. SLP Short Term Goal 2 (Week 1): Patient will utilize word finding strategies during conversational expression with min verbal cues. SLP Short Term Goal 3 (Week 1): Patient will utilize external aids to assist with recall of daily informaiton with supervision verbal cues.  Skilled Therapeutic Interventions: Skilled treatment session focused on addressing cognitive goals. SLP facilitated session with a medication management task; patient needed Max faded to Min verbal cues for problem solving, self-monitorg and correcting.  Patietn also required supervision levl verbal cues to refer back to external aids as needed.  Continue plan of care.   FIM:  Comprehension Comprehension Mode: Auditory Comprehension: 5-Follows basic conversation/direction: With extra time/assistive device Expression Expression Mode: Verbal Expression: 5-Expresses basic needs/ideas: With no assist Social Interaction Social Interaction: 6-Interacts appropriately with others with medication or extra time (anti-anxiety, antidepressant). Problem Solving Problem Solving: 5-Solves basic problems: With no assist Memory Memory: 5-Requires cues to use assistive device  Pain Pain Assessment Pain Assessment: No/denies pain  Therapy/Group: Individual Therapy  Charlane Ferretti., CCC-SLP [phone]  [name] 05/17/2012, 9:24 AM

## 2012-05-17 NOTE — Progress Notes (Signed)
Physical Therapy Note  Patient Details  Name: Revin Corker MRN: [id number] Date of Birth: 09-26-1940 Today's Date: 05/17/2012  1530-1555 (25 minutes) individual Pain: no reported pain Focus of treatment: Therapeutic activities focused on RT LE knee extension control in standing/ standing balance using Kinetron Treatment: Gait - 120 feet SBQC X 2 (room>< gym) SBA to min assist X 1 for potential loss of balance; Kinetron in standing focused on RT knee extension control in stance with bilateral UE support / min tactile cues (3 X 15); Kinetron in standing without UE support focused on maintaining standing balance with tactile cues at shoulder for trunk extension 2 X 15.    Draper Gallon,JIM 05/17/2012, 3:47 PM

## 2012-05-17 NOTE — Progress Notes (Signed)
Patient ID: Kyle Dudley, male   DOB: April 16, 1940, 72 y.o.   MRN: [id number] Subjective/Complaints: 72 y.o. right-handed male with history of seizure disorder (maintained on Keppra) and tobacco abuse. Admitted 05/10/2012 with right-sided weakness. 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 MRA of the head with no significant stenosis. 2D echo done revealing EF 55-65% and no wall abnormality. Carotid dopplers without ICA stenosis and incidental findings of bilateral thyroid cysts  No problems overnite  A 12 point review of systems has been performed and if not noted above is otherwise negative.   Objective: Vital Signs: Blood pressure 113/72, pulse 77, temperature 97.7 F (36.5 C), temperature source Oral, resp. rate 18, height 5\' 5"  (1.651 m), weight 47.1 kg (103 lb 13.4 oz), SpO2 98.00%. No results found.  Recent Labs  05/16/12 0723  WBC 6.5  HGB 15.0  HCT 43.4  PLT 229    Recent Labs  05/16/12 0723  NA 136  K 4.0  CL 100  GLUCOSE 91  BUN 18  CREATININE 0.74  CALCIUM 9.4   CBG (last 3)   Recent Labs  05/16/12 1643  GLUCAP 155*    Wt Readings from Last 3 Encounters:  05/16/12 47.1 kg (103 lb 13.4 oz)  05/11/12 48 kg (105 lb 13.1 oz)    Physical Exam:  Constitutional: He is oriented to person, place, and time. He appears well-developed.  Thin male  HENT: Dentition poor. Oral mucosa pink and moist.  Head: Normocephalic and atraumatic.  Eyes: Pupils are equal, round, and reactive to light.  Cardiovascular: Normal rate and regular rhythm. No murmurs  Pulmonary/Chest: Effort normal and breath sounds normal. No wheezes or rales.  Abdominal: Soft. Bowel sounds are normal. Non distended  Musculoskeletal: He exhibits no edema and no tenderness.  Neurological: He is alert and oriented to person, place, and time. A little slow to arouse this early am. Speech is slightly diminished. Reasonable but basic insight and awareness. Follows commands without  difficulty. RUE weakness 3-/5 Delt,Bi,Tri, 2- in grip and finger ext with decreased coordination. + pronator drift and diminished FMC. RUE is 4/5. RLE is grossly 4+/5 with diminished FMC. No sensory deficits.  Skin: Skin is warm and dry.    Assessment/Plan: 1. Functional deficits secondary to left centrum semiovale thrombotic infarct which require 3+ hours per day of interdisciplinary therapy in a comprehensive inpatient rehab setting. Physiatrist is providing close team supervision and 24 hour management of active medical problems listed below. Physiatrist and rehab team continue to assess barriers to discharge/monitor patient progress toward functional and medical goals. FIM: FIM - Bathing Bathing Steps Patient Completed: Chest;Right Arm;Left Arm;Abdomen;Front perineal area;Buttocks;Right upper leg;Left upper leg;Right lower leg (including foot);Left lower leg (including foot) Bathing: 5: Supervision: Safety issues/verbal cues  FIM - Upper Body Dressing/Undressing Upper body dressing/undressing steps patient completed: Thread/unthread right sleeve of front closure shirt/dress;Thread/unthread left sleeve of front closure shirt/dress;Pull shirt around back of front closure shirt/dress Upper body dressing/undressing: 5: Set-up assist to: Obtain clothing/put away FIM - Lower Body Dressing/Undressing Lower body dressing/undressing steps patient completed: Thread/unthread left pants leg;Pull pants up/down;Fasten/unfasten pants;Thread/unthread right pants leg;Don/Doff left shoe;Don/Doff right shoe Lower body dressing/undressing: 4: Min-Patient completed 75 plus % of tasks     FIM - Diplomatic Services operational officer Devices: Grab bars Toilet Transfers: 4-From toilet/BSC: Min A (steadying Pt. > 75%);4-To toilet/BSC: Min A (steadying Pt. > 75%)  FIM - Bed/Chair Transfer Bed/Chair Transfer Assistive Devices: Arm rests Bed/Chair Transfer: 7: Supine >  Sit: No assist;7: Sit > Supine: No  assist;4: Bed > Chair or W/C: Min A (steadying Pt. > 75%)  FIM - Locomotion: Wheelchair Distance: 150 Locomotion: Wheelchair: 0: Activity did not occur FIM - Locomotion: Ambulation Ambulation/Gait Assistance: 4: Min assist Locomotion: Ambulation: 4: Travels 150 ft or more with minimal assistance (Pt.>75%)  Comprehension Comprehension Mode: Auditory Comprehension: 5-Understands complex 90% of the time/Cues < 10% of the time  Expression Expression Mode: Verbal Expression: 5-Expresses complex 90% of the time/cues < 10% of the time  Social Interaction Social Interaction: 6-Interacts appropriately with others with medication or extra time (anti-anxiety, antidepressant).  Problem Solving Problem Solving: 4-Solves basic 75 - 89% of the time/requires cueing 10 - 24% of the time  Memory Memory: 4-Recognizes or recalls 75 - 89% of the time/requires cueing 10 - 24% of the time  Medical Problem List and Plan:  1. DVT Prophylaxis/Anticoagulation: Pharmaceutical: Lovenox  2. Pain Management: N/A  3. Mood: Offer ego support as with concerns/anxiety regarding loss of independence. LCSW to follow for evaluation.  4. Neuropsych: This patient is capable of making decisions on his/her own behalf.  5. H/o seizures: On keppra bid.  6. HTN: Blood pressures remain elevated. Started on norvasc Friday which seems to have helped.  LOS (Days) 4 A FACE TO FACE EVALUATION WAS PERFORMED  Erick Colace 05/17/2012 7:18 AM

## 2012-05-17 NOTE — Progress Notes (Signed)
Occupational Therapy Session Note  Patient Details  Name: Kyle Dudley MRN: [id number] Date of Birth: Mar 03, 1940  Today's Date: 05/17/2012 Time: 9528-4132 and 4401-0272 Time Calculation (min): 60 min and 45 min   Short Term Goals: Week 1:  OT Short Term Goal 1 (Week 1): Patient will complete all functional trasnfers with modified independence OT Short Term Goal 2 (Week 1): Patient will demo ability to perform upper extremity home exercises to improve function of right UE, independently OT Short Term Goal 3 (Week 1): Patient will complete lower body dressing using AE, prn, with supervision OT Short Term Goal 4 (Week 1): Patient will complete selected light homemaking tasks with supervision for safety  Skilled Therapeutic Interventions/Progress Updates:  Session 1: Therapy session focused on ADL retraining, functional transfers, and functional use of RUE during self care tasks. Patient sitting EOB upon OT arrival and willing to take shower on this date. Patient ambulated into walk-in shower with close supervision using quad cane. Patient completed shower initiating use of RUE for approx 50% of task. Patient reports having difficulty with grip in RUE and OT informed patient we would address this during afternoon therapy session. Patient practiced sustaining grip on grab bars in shower for approx 10 sec. Patient recalled tech of buttoning front buttons of shirt prior to donning and reported "it helps a lot." Patient required increased time to button 4 buttons. Patient then donned shirt and attempted to button top 2 buttons. After increased time OT assisted and encouraged patient to button all buttons prior to donning shirt on next attempt. Patient ambulated short, functional household distances in room with quad cane at close supervision level. Patient left sitting in recliner chair.   Session 2: Therapy session focused on functional ambulation, functional transfers (tub transfer), and neuro re-ed to RUE  with focus on sustained grip and FM skills. Patient educated on TTB and shower transfer. Patient completed dry tub transfer with close supervision with each. Patient reported he felt more comfortable with with TTB and OT agreed it was much more safer. Patient ambulated from room room to ADL apartment with [facility] and back to room at end of therapy session. Patient required rest break after each. Patient participated in neuro re-ed activities with focused of sustained grip and FM skills. Completed large peg task then 9 hole peg test: 38 sec in Lt hand and 2:30 in Rt hand. Patient practiced with 9 hole peg test again however time was not recorded. Patient dropped one peg throughout activity. Engaged in sustain grip task with rings as he placed across midline with trunk rotation and back to midline then reached to floor to place and bring back to table. Patient required 3 rest breaks during activity and sustained grip throughout. Patient fatigued at end of therapy session and left sitting in recliner chair with call light.      Therapy Documentation Precautions:  Precautions Precautions: Fall Restrictions Weight Bearing Restrictions: No Pain: No pain reported during therapy session.   See FIM for current functional status  Therapy/Group: Individual Therapy  Daneil Dan 05/17/2012, 12:15 PM

## 2012-05-18 ENCOUNTER — Inpatient Hospital Stay (HOSPITAL_COMMUNITY): Payer: Medicare Other

## 2012-05-18 ENCOUNTER — Inpatient Hospital Stay (HOSPITAL_COMMUNITY): Payer: Medicare Other | Admitting: Speech Pathology

## 2012-05-18 ENCOUNTER — Inpatient Hospital Stay (HOSPITAL_COMMUNITY): Payer: Medicare Other | Admitting: Occupational Therapy

## 2012-05-18 ENCOUNTER — Inpatient Hospital Stay (HOSPITAL_COMMUNITY): Payer: Medicare Other | Admitting: *Deleted

## 2012-05-18 NOTE — Progress Notes (Signed)
Speech Language Pathology Daily Session Note  Patient Details  Name: Kyle Dudley MRN: [id number] Date of Birth: [birth year]  Today's Date: 05/18/2012 Time: 0835-0900 Time Calculation (min): 25 min  Short Term Goals: Week 1: SLP Short Term Goal 1 (Week 1): Patient will demonstrate complex problem solving with supervision level verbal cues. SLP Short Term Goal 2 (Week 1): Patient will utilize word finding strategies during conversational expression with min verbal cues. SLP Short Term Goal 3 (Week 1): Patient will utilize external aids to assist with recall of daily informaiton with supervision verbal cues.  Skilled Therapeutic Interventions: Skilled treatment session focused on addressing cognitive goals. SLP facilitated task of reading directions for new medications and prepackaged foods; Patient required Max faded to Min verbal cues for mental flexibility, problem solving, self-monitoring and correcting.  Continue plan of care.   FIM:  Comprehension Comprehension Mode: Auditory Comprehension: 5-Follows basic conversation/direction: With extra time/assistive device Expression Expression Mode: Verbal Expression: 5-Expresses basic needs/ideas: With no assist Social Interaction Social Interaction: 6-Interacts appropriately with others with medication or extra time (anti-anxiety, antidepressant). Problem Solving Problem Solving: 5-Solves basic problems: With no assist Memory Memory: 5-Requires cues to use assistive device  Pain Pain Assessment Pain Assessment: No/denies pain  Therapy/Group: Individual Therapy  Charlane Ferretti., CCC-SLP [phone]  Kyle Dudley 05/18/2012, 11:36 AM

## 2012-05-18 NOTE — Progress Notes (Signed)
Patient ID: Kyle Dudley, male   DOB: May 30, 1940, 72 y.o.   MRN: [id number] Subjective/Complaints: 72 y.o. right-handed male with history of seizure disorder (maintained on Keppra) and tobacco abuse. Admitted 05/10/2012 with right-sided weakness. 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 MRA of the head with no significant stenosis. 2D echo done revealing EF 55-65% and no wall abnormality. Carotid dopplers without ICA stenosis and incidental findings of bilateral thyroid cysts Therapy going ok No pains No problems overnite  A 12 point review of systems has been performed and if not noted above is otherwise negative.   Objective: Vital Signs: Blood pressure 114/70, pulse 84, temperature 98 F (36.7 C), temperature source Oral, resp. rate 18, height 5\' 5"  (1.651 m), weight 47.1 kg (103 lb 13.4 oz), SpO2 99.00%. No results found.  Recent Labs  05/16/12 0723  WBC 6.5  HGB 15.0  HCT 43.4  PLT 229    Recent Labs  05/16/12 0723  NA 136  K 4.0  CL 100  GLUCOSE 91  BUN 18  CREATININE 0.74  CALCIUM 9.4   CBG (last 3)   Recent Labs  05/16/12 1643  GLUCAP 155*    Wt Readings from Last 3 Encounters:  05/16/12 47.1 kg (103 lb 13.4 oz)  05/11/12 48 kg (105 lb 13.1 oz)    Physical Exam:  Constitutional: He is oriented to person, place, and time. He appears well-developed.  Thin male  HENT: Dentition poor. Oral mucosa pink and moist.  Head: Normocephalic and atraumatic.  Eyes: Pupils are equal, round, and reactive to light.  Cardiovascular: Normal rate and regular rhythm. No murmurs  Pulmonary/Chest: Effort normal and breath sounds normal. No wheezes or rales.  Abdominal: Soft. Bowel sounds are normal. Non distended  Musculoskeletal: He exhibits no edema and no tenderness.  Neurological: He is alert and oriented to person, place, and time. A little slow to arouse this early am. Speech is slightly diminished. Reasonable but basic insight and awareness.  Follows commands without difficulty. RUE weakness 4-/5 Delt,Bi,Tri, 3- in grip and finger ext with decreased coordination. + pronator drift and diminished FMC. RUE is 4/5. RLE is grossly 4+/5 with diminished FMC. No sensory deficits.  Skin: Skin is warm and dry.    Assessment/Plan: 1. Functional deficits secondary to left centrum semiovale thrombotic infarct which require 3+ hours per day of interdisciplinary therapy in a comprehensive inpatient rehab setting. Physiatrist is providing close team supervision and 24 hour management of active medical problems listed below. Physiatrist and rehab team continue to assess barriers to discharge/monitor patient progress toward functional and medical goals. FIM: FIM - Bathing Bathing Steps Patient Completed: Chest;Right Arm;Left Arm;Abdomen;Front perineal area;Buttocks;Right upper leg;Left upper leg;Right lower leg (including foot);Left lower leg (including foot) Bathing: 5: Supervision: Safety issues/verbal cues  FIM - Upper Body Dressing/Undressing Upper body dressing/undressing steps patient completed: Thread/unthread right sleeve of front closure shirt/dress;Thread/unthread left sleeve of front closure shirt/dress;Pull shirt around back of front closure shirt/dress Upper body dressing/undressing: 4: Min-Patient completed 75 plus % of tasks FIM - Lower Body Dressing/Undressing Lower body dressing/undressing steps patient completed: Thread/unthread left pants leg;Fasten/unfasten pants;Thread/unthread right pants leg;Don/Doff right sock;Thread/unthread right underwear leg;Thread/unthread left underwear leg;Pull underwear up/down;Pull pants up/down;Don/Doff left sock Lower body dressing/undressing: 5: Supervision: Safety issues/verbal cues  FIM - Toileting Toileting steps completed by patient: Performs perineal hygiene;Adjust clothing prior to toileting;Adjust clothing after toileting Toileting Assistive Devices: Grab bar or rail for support Toileting:  5: Supervision: Safety issues/verbal cues  FIM - Diplomatic Services operational officer Devices: Grab bars Toilet Transfers: 5-To toilet/BSC: Supervision (verbal cues/safety issues);5-From toilet/BSC: Supervision (verbal cues/safety issues) (completed in standing )  FIM - Bed/Chair Transfer Bed/Chair Transfer Assistive Devices: Arm rests;Cane Bed/Chair Transfer: 4: Chair or W/C > Bed: Min A (steadying Pt. > 75%);4: Bed > Chair or W/C: Min A (steadying Pt. > 75%)  FIM - Locomotion: Wheelchair Distance: 150 Locomotion: Wheelchair: 0: Activity did not occur FIM - Locomotion: Ambulation Locomotion: Ambulation Assistive Devices: Occupational hygienist Ambulation/Gait Assistance: 4: Min assist Locomotion: Ambulation: 4: Travels 150 ft or more with minimal assistance (Pt.>75%)  Comprehension Comprehension Mode: Auditory Comprehension: 6-Follows complex conversation/direction: With extra time/assistive device  Expression Expression Mode: Verbal Expression: 6-Expresses complex ideas: With extra time/assistive device  Social Interaction Social Interaction: 6-Interacts appropriately with others with medication or extra time (anti-anxiety, antidepressant).  Problem Solving Problem Solving: 5-Solves basic problems: With no assist  Memory Memory: 5-Requires cues to use assistive device  Medical Problem List and Plan:  1. DVT Prophylaxis/Anticoagulation: Pharmaceutical: Lovenox  2. Pain Management: N/A  3. Mood: Offer ego support as with concerns/anxiety regarding loss of independence. LCSW to follow for evaluation.  4. Neuropsych: This patient is capable of making decisions on his/her own behalf.  5. H/o seizures: On keppra bid.  6. HTN: Blood pressures remain elevated. Started on norvasc Friday which seems to have helped.  LOS (Days) 5 A FACE TO FACE EVALUATION WAS PERFORMED  Erick Colace 05/18/2012 7:29 AM

## 2012-05-18 NOTE — Progress Notes (Signed)
Physical Therapy Session Note  Patient Details  Name: Kyle Dudley MRN: [id number] Date of Birth: 10/22/1940  Today's Date: 05/18/2012 Time: 1130-1200 Time Calculation (min): 30 min  Short Term Goals: Week 1:  PT Short Term Goal 1 (Week 1): STGs=LTGs  Skilled Therapeutic Interventions/Progress Updates:  Patient received sitting in recliner. This session focused on gait training (with and without AD), floor transfer, car transfer, curb, and high level balance; see details below. Discussion/education about falls risk at home and indications vs. Contraindications for attempting floor transfer vs. Calling EMS. Patient performed floor transfer with supervision/verbal cues for proper sequencing and technique. Patient return demonstrated and verbalized when to call EMS. Patient performed car transfer to low height (sedan-like) car height (heigh of nephew's car) with SBQC and supervision/verbal cues for proper sequencing/technique and safety as patient attempts to stand on one leg to get into car. Additionally, patient holding onto car door, verbal cues not to do so for safety purposes.  Patient participated in standing balance activity in which external perturbations applied to facilitate use of hip, ankle, and stepping strategies to maintain balance. Patient able to maintain balance with min-mod perturbations.  Patient returned to room and left seated in recliner with all needs within reach.  Therapy Documentation Precautions:  Precautions Precautions: Fall Restrictions Weight Bearing Restrictions: No Pain: Pain Assessment Pain Assessment: No/denies pain Pain Score: 0-No pain Locomotion : Ambulation Ambulation: Yes Ambulation/Gait Assistance: 4: Min guard, progressing to S Ambulation Distance (Feet): 160 Feet, 200' Assistive device: None;Small based quad cane Ambulation/Gait Assistance Details: Verbal cues for gait pattern;Tactile cues for posture Gait Gait: Yes Gait Pattern: Step-through  pattern;Decreased step length - right;Decreased weight shift to right;Decreased stride length;Decreased dorsiflexion - right;Narrow base of support;Trunk flexed Stairs / Additional Locomotion Curb: 5: Supervision (With [employer]) Wheelchair Mobility Wheelchair Mobility: No   See FIM for current functional status  Therapy/Group: Individual Therapy  Chipper Herb. Aviela Blundell, PT, DPT  05/18/2012, 3:06 PM

## 2012-05-18 NOTE — Progress Notes (Signed)
Occupational Therapy Session Note  Patient Details  Name: Kyle Dudley MRN: [id number] Date of Birth: 26-Feb-1940  Today's Date: 05/18/2012 Time: 4782-9562 and 1308-6578 Time Calculation (min): 57 min and 45 min   Short Term Goals: Week 1:  OT Short Term Goal 1 (Week 1): Patient will complete all functional trasnfers with modified independence OT Short Term Goal 2 (Week 1): Patient will demo ability to perform upper extremity home exercises to improve function of right UE, independently OT Short Term Goal 3 (Week 1): Patient will complete lower body dressing using AE, prn, with supervision OT Short Term Goal 4 (Week 1): Patient will complete selected light homemaking tasks with supervision for safety  Skilled Therapeutic Interventions/Progress Updates:    Session 1: Therapy session focused on ADL retraining, dynamic and static standing balance, functional ambulation in household distances, and FM skills to RUE. Patient declined shower on this date requesting to "wash up at sink." Patient retrieved clothing with supervision and ambulated throughout room with [facility] with supervision. Patient completed oral care while standing with supervision and was fatigued after. OT encouraged rest break. Patient initiates use of RUE during self-care tasks without cues. Patient attempted to tie shoes with feet propped on w/c however unable to sustain pincer grip with Rt hand for task. Patient demonstrated carryover of adaptive tech with button up shirt as he buttoned all buttons except one with increased time before donning. Patient required assist to button top button only. Patient required frequent rest breaks d/t fatigue in Rt hand. Patient then practiced tying bow on OT's wrist and patient able to tie loose bow with increased time. Patient also then practiced holding string with Rt hand and threading beads. Patient sustained pinch for 5-8 sec.   Session 2: Therapy session focused on neuro re-ed (specifically FM  skills) to RUE while completing HEP. Patient ambulated with [facility] from room to therapy gym and back to room with close supervision. Patient completed HEP in sitting. HEP focused on opposition of thumb to all digits, grasp/release, sustained grip, pincer grip, pronation/supination, and reach. Patient demonstrated inability to oppose thumb and fifth digit. Patient attempted in-hand manipulation however unable to complete translation with small pegs. Patient sustained grip on 25 cards with Rt hand as he used Lt hand to place cards on table then completed using Lt hand to hold cards and place each card on table. Used hands bilaterally to gather cards and place in pile. Patient practiced demonstrated ability to string beads using Lt hand to hold string and Rt hand to thread small beads. Patient practiced finger lifts with palm flat on table and patient unable to lift last 3 digits on ulnar side of hand. OT educated patient on exercises to complete in room that were not on list of HEP. Patient appeared to be motivated stating "I can do some tonight."  Therapy Documentation Precautions:  Precautions Precautions: Fall Restrictions Weight Bearing Restrictions: No  Pain: No c/o pain during therapy sessions.   See FIM for current functional status  Therapy/Group: Individual Therapy  Daneil Dan 05/18/2012, 11:04 AM

## 2012-05-18 NOTE — Progress Notes (Signed)
Occupational Therapy Session Note  Patient Details  Name: Benny Deutschman MRN: [id number] Date of Birth: December 13, 1940  Today's Date: 05/18/2012 Time: 5784-6962 Time Calculation (min): 28 min  Skilled Therapeutic Interventions/Progress Updates:    Pt began with toileting task with min guard assist and use of the quad cane.  Pt then ambulated back to bedside chair to work on RUE strengthening and FM coordination tasks.  Pt able to use 3 lb weight for 1 set of 10 repetitions for right shoulder flexion with min assist, and also one set of 10 reps elbow flexion with min assist also for correct technique.  Pt also utilized bottle of soda to work on reaching and placing on therapist's hand overhead in different planes to simulate reaching into a cabinet.  Pt able to perform with good technique and minimal compensation in the trunk.  Progressed to FM coordination task of flipping cards with the right hand as well.  Needed increased time with this activity as well.    Therapy Documentation Precautions:  Precautions Precautions: Fall Restrictions Weight Bearing Restrictions: No  Pain: Pain Assessment Pain Assessment: No/denies pain Pain Score: 0-No pain ADL: See FIM for current functional status  Therapy/Group: Individual Therapy  Lucette Kratz OTR/L 05/18/2012, 12:25 PM

## 2012-05-19 ENCOUNTER — Inpatient Hospital Stay (HOSPITAL_COMMUNITY): Payer: Medicare Other | Admitting: *Deleted

## 2012-05-19 ENCOUNTER — Inpatient Hospital Stay (HOSPITAL_COMMUNITY): Payer: Medicare Other | Admitting: Speech Pathology

## 2012-05-19 ENCOUNTER — Inpatient Hospital Stay (HOSPITAL_COMMUNITY): Payer: Medicare Other

## 2012-05-19 NOTE — Progress Notes (Signed)
Occupational Therapy Session Note  Patient Details  Name: Kyle Dudley MRN: [id number] Date of Birth: 1940-04-19  Today's Date: 05/19/2012 Time: 0930-1030 and 1415-1500 Time Calculation (min): 60 min and 45 min   Short Term Goals: Week 1:  OT Short Term Goal 1 (Week 1): Patient will complete all functional trasnfers with modified independence OT Short Term Goal 2 (Week 1): Patient will demo ability to perform upper extremity home exercises to improve function of right UE, independently OT Short Term Goal 3 (Week 1): Patient will complete lower body dressing using AE, prn, with supervision OT Short Term Goal 4 (Week 1): Patient will complete selected light homemaking tasks with supervision for safety  Skilled Therapeutic Interventions/Progress Updates:    Session 1: Therapy session focused on functional ambulation in household distances, dynamic standing balance, and ADL retraining. Patient retrieved clothing using SBQC at mod I level in controlled environment. Patient completed shower at mod I level while sitting on shower chair. Patient required increased time to fasten buttons and was fatigued after. Patient able to complete all lower body dressing however required assist with tying shoes. Patient initiates use of RUE during functional tasks as he used it to stabilize toothbrush while applying toothpaste. Patient demonstrates good safety with remembering to lock w/c brakes whenever sitting in w/c. After speaking with PT patient to be mod I in room (controlled environment).   Session 2: Therapy session focused on neuro re-ed (specifically FM skills) to RUE while completing HEP. Patient's sister present during therapy session and observed patient completing HEP. Patient ambulated with [facility] from room to therapy gym and back to room with supervision. Patient completed HEP in sitting. HEP focused on opposition of thumb to all digits, grasp/release, sustained grip, pincer grip, pronation/supination, and  controlled reach. Patient demonstrated inability to oppose thumb and fifth digit.  Patient sustained grip on 20 cards with Rt hand as he used Lt hand to place cards on table then completed using Lt hand to hold cards and place each card on table. Used hands bilaterally to gather cards and place in pile. Patient demonstrated ability to string beads using Lt hand to hold string and Rt hand to thread small beads. Patient practiced finger lifts with palm flat on table and patient unable to lift last 3 digits on ulnar side of hand. Engaged in sustained grip and grading of grip with stacking of 3 different sized blocks on 3 attempts. Patient then engaged in tying shoes exercise as he practiced tying bow on OT's wrist placed in front of him on table. Patient continues to demonstrate difficulty. OT educated and provided demonstration of "bunny ears" technique however pt unwilling to attempt.   Therapy Documentation Precautions:  Precautions Precautions: Fall Restrictions Weight Bearing Restrictions: No  Pain:  No c/o pain during either therapy session.     Other Treatments:    See FIM for current functional status  Therapy/Group: Individual Therapy  Daneil Dan 05/19/2012, 11:22 AM

## 2012-05-19 NOTE — Patient Care Conference (Signed)
Inpatient RehabilitationTeam Conference and Plan of Care Update Date: 05/18/2012   Time: 11:10 AM    Patient Name: Kyle Dudley      Medical Record Number: [id number]  Date of Birth: July 01, 1940 Sex: Male         Room/Bed: 4031/4031-01 Payor Info: Payor: MEDICARE  Plan: MEDICARE PART A AND B  Product Type: *No Product type*     Admitting Diagnosis: L CVA H/O SEIZURES  Admit Date/Time:  05/13/2012  6:21 PM Admission Comments: No comment available   Primary Diagnosis:  Acute ischemic left MCA stroke Principal Problem: Acute ischemic left MCA stroke  Patient Active Problem List   Diagnosis Date Noted  . Unspecified essential hypertension 05/13/2012  . Acute ischemic left MCA stroke 05/11/2012  . Seizure disorder 05/11/2012    Expected Discharge Date: Expected Discharge Date: 05/21/12  Team Members Present: Physician leading conference: Dr. Claudette Laws Social Worker Present: Amada Jupiter, LCSW Nurse Present: Other (comment) Keturah Barre, RN) PT Present: Wanda Plump, PT;Other (comment) Clarisse Gouge Ripa, PT) OT Present: Leonette Monarch, OT;Other (comment) Dorathy Daft Perkinson, OT) SLP Present: Fae Pippin, SLP Other (Discipline and Name): Tora Duck, PPS     Current Status/Progress Goal Weekly Team Focus  Medical   no major medical issues  maintain stability  fall prevention and education    Bowel/Bladder   Continent of bowel and bladder. LBM 05/15/12  Pt to remain continent of bowel and bladder  Monitor   Swallow/Nutrition/ Hydration   regular, thin          ADL's   supervision for self-care tasks and occasional min assist with buttons. patient practiced with TTB and OT recommends patient having this at home.  modified independence  dynamic standing balance, posutal control, neuro re-ed to RUE (FM skills, grip strength, etc).   Mobility   S-min A for mild LOB with gait/stairs/standing dynamic balance tasks  mod I  safety, R LE NMR, gait, stairs, balance, activity tolerance,  strengthening, sequencing with AD   Communication   min assist   supervision       Safety/Cognition/ Behavioral Observations  supervision-min assist   mod I; intermittent assist      Pain   No c/o pain  <3  Offer pain medication 1hr prior to initial therapy session   Skin   CDI  No additional skin breakdown  Routine turn q 2hrs    Rehab Goals Patient on target to meet rehab goals: Yes *See Care Plan and progress notes for long and short-term goals.  Barriers to Discharge: balance    Possible Resolutions to Barriers:  see above, D/C this week    Discharge Planning/Teaching Needs:  Home alone to independent apartment with neice and nephew assisting as needed.      Team Discussion:  Making good gains and anticipate reaching mod i goals (lives alone).  Cognition improving nicely.  No concerns.  Revisions to Treatment Plan:  None   Continued Need for Acute Rehabilitation Level of Care: The patient requires daily medical management by a physician with specialized training in physical medicine and rehabilitation for the following conditions: Daily direction of a multidisciplinary physical rehabilitation program to ensure safe treatment while eliciting the highest outcome that is of practical value to the patient.: Yes Daily medical management of patient stability for increased activity during participation in an intensive rehabilitation regime.: Yes Daily analysis of laboratory values and/or radiology reports with any subsequent need for medication adjustment of medical intervention for : Neurological problems  [name]  05/19/2012, 11:38 AM

## 2012-05-19 NOTE — Progress Notes (Signed)
Speech Language Pathology Daily Session Note  Patient Details  Name: Colvin Blatt MRN: [id number] Date of Birth: 1941/01/06  Today's Date: 05/19/2012 Time: 4098-1191 Time Calculation (min): 40 min  Short Term Goals: Week 1: SLP Short Term Goal 1 (Week 1): Patient will demonstrate complex problem solving with supervision level verbal cues. SLP Short Term Goal 2 (Week 1): Patient will utilize word finding strategies during conversational expression with min verbal cues. SLP Short Term Goal 3 (Week 1): Patient will utilize external aids to assist with recall of daily informaiton with supervision verbal cues.  Skilled Therapeutic Interventions: Group treatment session focused on addressing cognitive goals.  SLP facilitated session with discussion regarding discharge planning and modifications that need to be in place for home management to ensure safety.  Patient required supervision level question cues for anticipatory awareness.  Patient demonstrated alternating attention between complex conversations with other group participants without assist; however, required cues to initiate interactions.    FIM:  Comprehension Comprehension Mode: Auditory Comprehension: 5-Understands complex 90% of the time/Cues < 10% of the time Expression Expression Mode: Verbal Expression: 5-Expresses complex 90% of the time/cues < 10% of the time Social Interaction Social Interaction: 5-Interacts appropriately 90% of the time - Needs monitoring or encouragement for participation or interaction. Problem Solving Problem Solving: 5-Solves complex 90% of the time/cues < 10% of the time Memory Memory: 5-Requires cues to use assistive device  Pain Pain Assessment Pain Assessment: No/denies pain  Therapy/Group: Group Therapy  Charlane Ferretti., CCC-SLP [phone]  Danyel Griess 05/19/2012, 4:35 PM

## 2012-05-19 NOTE — Progress Notes (Signed)
Social Work Patient ID: Kyle Dudley, male   DOB: 14-Oct-1940, 72 y.o.   MRN: [id number]  Met yesterday afternoon with patient and sister to review team conference.  Pt aware and agreeable with targeted d/c 4/26 at mod i goals.  Discussed possible referral for mobile meals (per tx suggestion) - pt uncertain at this time.  Still to discuss HH vs OP therapy.  Continue to follow.  [name], LCSW

## 2012-05-19 NOTE — Progress Notes (Signed)
Physical Therapy Session Note  Patient Details  Name: Malaquias Lenker MRN: [id number] Date of Birth: 1940/05/31  Today's Date: 05/19/2012 Time: 1115-1200 Time Calculation (min): 45 min  Short Term Goals: Week 1:  PT Short Term Goal 1 (Week 1): STGs=LTGs  Skilled Therapeutic Interventions/Progress Updates:    Patient received sitting in recliner. This session focused on gait training in controlled and home environments and static and dynamic standing balance, see details below.  Patient made mod I with SBQC in controlled environment of room; still requires supervision/verbal cues in community settings due to external distractions and mild losses of balance with head turns. Patient returned to room and left seated in recliner with all needs within reach.  Therapy Documentation Precautions:  Precautions Precautions: Fall Restrictions Weight Bearing Restrictions: No Pain: Pain Assessment Pain Assessment: No/denies pain Pain Score: 0-No pain Locomotion : Ambulation Ambulation: Yes Ambulation/Gait Assistance: 5: Supervision;6: Modified independent (Device/Increase time) Ambulation Distance (Feet): 175 Feet x2, several small bouts of 20-30' within room, several small bouts of 20-45' without AD Assistive device: Small based quad cane Ambulation/Gait Assistance Details: Patient instructed in gait training x175' in controlled environment with [facility]. Patient performed ambulation within his room with [facility] with mod I, ambulating to/from bathroom, negotiating threshold into/out of bathroom, reaching for clothing/objects in bottom drawer, safely moving table out of his way and able to negotiate door to bathroom. Gait Gait: Yes Gait Pattern: Step-through pattern;Decreased stride length;Narrow base of support;Trunk flexed Stairs / Additional Locomotion Stairs: Yes Stairs Assistance: 5: Supervision Stairs Assistance Details: Verbal cues for precautions/safety Stair Management Technique: One rail  Left;Step to pattern;Forwards Number of Stairs: 10 Height of Stairs: 6 Wheelchair Mobility Wheelchair Mobility: No  Balance: Balance Balance Assessed: Yes Static Standing Balance Static Standing - Balance Support: No upper extremity supported Static Standing - Level of Assistance: 5: Stand by assistance;4: Min assist Static Standing - Comment/# of Minutes: Feet together x30" with SBA, feet together, eyes closed x60", tandem stance R LE ahead x19", x20", x30"; tandem stance L LE ahead x20", x30" Static Stance: Eyes closed Static Stance: Eyes Closed: with feet together x60", no LOB, SBA Dynamic Standing Balance Dynamic Standing - Balance Support: No upper extremity supported Dynamic Standing - Level of Assistance: 5: Stand by assistance Dynamic Standing - Balance Activities: Kicking ball;Ball toss Dynamic Standing - Comments: 20 reciprocal toe taps on 5 inch step without UE support and SBA, no LOB. Patient performed ambulation while bouncing a ball to facilitate balance and coordination challenge, patient required to pick up ball when he drops it, performs all aspects of activity with SBA. Patient performed ball toss against rebounder with 2# weighted ball standing with SBA, no LOB.  See FIM for current functional status  Therapy/Group: Individual Therapy  Chipper Herb. Camron Monday, PT, DPT  05/19/2012, 12:08 PM

## 2012-05-19 NOTE — Progress Notes (Signed)
Patient ID: Kyle Dudley, male   DOB: [birth year], 72 y.o.   MRN: [id number] Subjective/Complaints: 72 y.o. right-handed male with history of seizure disorder (maintained on Keppra) and tobacco abuse. Admitted 05/10/2012 with right-sided weakness. 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 MRA of the head with no significant stenosis. 2D echo done revealing EF 55-65% and no wall abnormality. Carotid dopplers without ICA stenosis and incidental findings of bilateral thyroid cysts  Learning meds THerapy notes mainly balance problems No problems overnite  A 12 point review of systems has been performed and if not noted above is otherwise negative.   Objective: Vital Signs: Blood pressure 112/79, pulse 90, temperature 98 F (36.7 C), temperature source Oral, resp. rate 18, height 5\' 5"  (1.651 m), weight 46.312 kg (102 lb 1.6 oz), SpO2 99.00%. No results found.  Recent Labs  05/16/12 0723  WBC 6.5  HGB 15.0  HCT 43.4  PLT 229    Recent Labs  05/16/12 0723  NA 136  K 4.0  CL 100  GLUCOSE 91  BUN 18  CREATININE 0.74  CALCIUM 9.4   CBG (last 3)   Recent Labs  05/16/12 1643  GLUCAP 155*    Wt Readings from Last 3 Encounters:  05/18/12 46.312 kg (102 lb 1.6 oz)  05/11/12 48 kg (105 lb 13.1 oz)    Physical Exam:  Constitutional: He is oriented to person, place, and time. He appears well-developed.  Thin male  HENT: Dentition poor. Oral mucosa pink and moist.  Head: Normocephalic and atraumatic.  Eyes: Pupils are equal, round, and reactive to light.  Cardiovascular: Normal rate and regular rhythm. No murmurs  Pulmonary/Chest: Effort normal and breath sounds normal. No wheezes or rales.  Abdominal: Soft. Bowel sounds are normal. Non distended  Musculoskeletal: He exhibits no edema and no tenderness.  Neurological: He is alert and oriented to person, place, and time. A little slow to arouse this early am. Speech is slightly diminished. Reasonable but  basic insight and awareness. Follows commands without difficulty. RUE weakness 4-/5 Delt,Bi,Tri, 3- in grip and finger ext with decreased coordination. + pronator drift and diminished FMC. RUE is 4/5. RLE is grossly 4+/5 with diminished FMC. No sensory deficits.  Skin: Skin is warm and dry.    Assessment/Plan: 1. Functional deficits secondary to left centrum semiovale thrombotic infarct which require 3+ hours per day of interdisciplinary therapy in a comprehensive inpatient rehab setting. Physiatrist is providing close team supervision and 24 hour management of active medical problems listed below. Physiatrist and rehab team continue to assess barriers to discharge/monitor patient progress toward functional and medical goals. FIM: FIM - Bathing Bathing Steps Patient Completed: Chest;Right Arm;Left Arm;Abdomen (requested to only wash UB) Bathing: 5: Supervision: Safety issues/verbal cues  FIM - Upper Body Dressing/Undressing Upper body dressing/undressing steps patient completed: Thread/unthread right sleeve of front closure shirt/dress;Thread/unthread left sleeve of front closure shirt/dress;Pull shirt around back of front closure shirt/dress;Pull shirt over trunk Upper body dressing/undressing: 4: Min-Patient completed 75 plus % of tasks FIM - Lower Body Dressing/Undressing Lower body dressing/undressing steps patient completed: Thread/unthread left pants leg;Fasten/unfasten pants;Thread/unthread right pants leg;Thread/unthread right underwear leg;Thread/unthread left underwear leg;Pull underwear up/down;Pull pants up/down;Don/Doff left shoe;Don/Doff right shoe Lower body dressing/undressing: 4: Min-Patient completed 75 plus % of tasks  FIM - Toileting Toileting steps completed by patient: Performs perineal hygiene;Adjust clothing prior to toileting;Adjust clothing after toileting Toileting Assistive Devices: Grab bar or rail for support Toileting: 5: Supervision: Safety issues/verbal  cues  FIM - Diplomatic Services operational officer Devices: Grab bars Toilet Transfers: 5-To toilet/BSC: Supervision (verbal cues/safety issues);5-From toilet/BSC: Supervision (verbal cues/safety issues) (completed in standing )  FIM - Bed/Chair Transfer Bed/Chair Transfer Assistive Devices: Arm rests;Cane Bed/Chair Transfer: 5: Bed > Chair or W/C: Supervision (verbal cues/safety issues);5: Chair or W/C > Bed: Supervision (verbal cues/safety issues)  FIM - Locomotion: Wheelchair Distance: 150 Locomotion: Wheelchair: 0: Activity did not occur FIM - Locomotion: Ambulation Locomotion: Ambulation Assistive Devices: Occupational hygienist Ambulation/Gait Assistance: 4: Min guard Locomotion: Ambulation: 4: Travels 150 ft or more with minimal assistance (Pt.>75%)  Comprehension Comprehension Mode: Auditory Comprehension: 5-Follows basic conversation/direction: With no assist  Expression Expression Mode: Verbal Expression: 5-Expresses basic needs/ideas: With no assist  Social Interaction Social Interaction: 6-Interacts appropriately with others with medication or extra time (anti-anxiety, antidepressant).  Problem Solving Problem Solving Mode: Not assessed Problem Solving: 5-Solves basic problems: With no assist  Memory Memory Mode: Not assessed Memory: 5-Requires cues to use assistive device  Medical Problem List and Plan:  1. DVT Prophylaxis/Anticoagulation: Pharmaceutical: Lovenox  2. Pain Management: N/A  3. Mood: Offer ego support as with concerns/anxiety regarding loss of independence. LCSW to follow for evaluation.  4. Neuropsych: This patient is capable of making decisions on his/her own behalf.  5. H/o seizures: On keppra bid.  6. HTN: Blood pressures remain elevated. Started on norvasc Friday which seems to have helped.  LOS (Days) 6 A FACE TO FACE EVALUATION WAS PERFORMED  Erick Colace 05/19/2012 7:18 AM

## 2012-05-20 ENCOUNTER — Inpatient Hospital Stay (HOSPITAL_COMMUNITY): Payer: Medicare Other | Admitting: *Deleted

## 2012-05-20 ENCOUNTER — Inpatient Hospital Stay (HOSPITAL_COMMUNITY): Payer: Medicare Other | Admitting: Speech Pathology

## 2012-05-20 ENCOUNTER — Inpatient Hospital Stay (HOSPITAL_COMMUNITY): Payer: Medicare Other

## 2012-05-20 LAB — CREATININE, SERUM
Creatinine, Ser: 0.8 mg/dL (ref 0.50–1.35)
GFR calc Af Amer: >90 mL/min (ref >90)
GFR calc non Af Amer: 88 mL/min — ABNORMAL LOW (ref >90)

## 2012-05-20 MED ORDER — ADULT MULTIVITAMIN W/MINERALS CH: 1.0000 | ORAL_TABLET | Freq: Every day | ORAL | Status: DC

## 2012-05-20 MED ORDER — AMLODIPINE BESYLATE 2.5 MG PO TABS: 2.5000 mg | ORAL_TABLET | Freq: Every day | ORAL | Status: DC

## 2012-05-20 MED ORDER — ASPIRIN 81 MG PO CHEW: 81.0000 mg | CHEWABLE_TABLET | Freq: Every day | ORAL | Status: DC

## 2012-05-20 NOTE — Progress Notes (Signed)
Patient ID: Kyle Dudley, male   DOB: Dec 29, 1940, 72 y.o.   MRN: [id number] Subjective/Complaints: 72 y.o. right-handed male with history of seizure disorder (maintained on Keppra) and tobacco abuse. Admitted 05/10/2012 with right-sided weakness. 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 MRA of the head with no significant stenosis. 2D echo done revealing EF 55-65% and no wall abnormality. Carotid dopplers without ICA stenosis and incidental findings of bilateral thyroid cysts  Asking about D/C for tomorrow No problems overnite  A 12 point review of systems has been performed and if not noted above is otherwise negative.   Objective: Vital Signs: Blood pressure 118/84, pulse 93, temperature 97.2 F (36.2 C), temperature source Oral, resp. rate 16, height 5\' 5"  (1.651 m), weight 46.312 kg (102 lb 1.6 oz), SpO2 97.00%. No results found. No results found for this basename: WBC, HGB, HCT, PLT,  in the last 72 hours No results found for this basename: NA, K, CL, CO, GLUCOSE, BUN, CREATININE, CALCIUM,  in the last 72 hours CBG (last 3)  No results found for this basename: GLUCAP,  in the last 72 hours  Wt Readings from Last 3 Encounters:  05/18/12 46.312 kg (102 lb 1.6 oz)  05/11/12 48 kg (105 lb 13.1 oz)    Physical Exam:  Constitutional: He is oriented to person, place, and time. He appears well-developed.  Thin male  HENT: Dentition poor. Oral mucosa pink and moist.  Head: Normocephalic and atraumatic.  Eyes: Pupils are equal, round, and reactive to light.  Cardiovascular: Normal rate and regular rhythm. No murmurs  Pulmonary/Chest: Effort normal and breath sounds normal. No wheezes or rales.  Abdominal: Soft. Bowel sounds are normal. Non distended  Musculoskeletal: He exhibits no edema and no tenderness.  Neurological: He is alert and oriented to person, place, and time. A little slow to arouse this early am. Speech is slightly diminished. Reasonable but basic  insight and awareness. Follows commands without difficulty. RUE weakness 4-/5 Delt,Bi,Tri, 3- in grip and finger ext with decreased coordination. + pronator drift and diminished FMC. RUE is 4/5. RLE is grossly 4+/5 with diminished FMC. No sensory deficits.  Skin: Skin is warm and dry.    Assessment/Plan: 1. Functional deficits secondary to left centrum semiovale thrombotic infarct ready for D/C in am after MD rounds FIM: FIM - Bathing Bathing Steps Patient Completed: Chest;Right Arm;Left Arm;Abdomen;Front perineal area;Left lower leg (including foot);Right lower leg (including foot);Buttocks;Right upper leg;Left upper leg Bathing: 6: Assistive device (Comment) (complete with shower chair)  FIM - Upper Body Dressing/Undressing Upper body dressing/undressing steps patient completed: Thread/unthread right sleeve of front closure shirt/dress;Thread/unthread left sleeve of front closure shirt/dress;Pull shirt around back of front closure shirt/dress;Pull shirt over trunk;Button/unbutton shirt Upper body dressing/undressing: 6: More than reasonable amount of time FIM - Lower Body Dressing/Undressing Lower body dressing/undressing steps patient completed: Thread/unthread left pants leg;Fasten/unfasten pants;Thread/unthread right pants leg;Thread/unthread right underwear leg;Thread/unthread left underwear leg;Pull underwear up/down;Pull pants up/down;Don/Doff left shoe;Don/Doff right shoe;Don/Doff right sock;Don/Doff left sock Lower body dressing/undressing: 4: Min-Patient completed 75 plus % of tasks  FIM - Toileting Toileting steps completed by patient: Performs perineal hygiene;Adjust clothing prior to toileting;Adjust clothing after toileting Toileting Assistive Devices: Grab bar or rail for support Toileting: 5: Supervision: Safety issues/verbal cues  FIM - Diplomatic Services operational officer Devices: Grab bars Toilet Transfers: 5-To toilet/BSC: Supervision (verbal cues/safety  issues);5-From toilet/BSC: Supervision (verbal cues/safety issues) (completed in standing )  FIM - Bed/Chair Transfer Bed/Chair Transfer Assistive Devices: Arm  rests;Cane Bed/Chair Transfer: 6: Chair or W/C > Bed: No assist;6: Bed > Chair or W/C: No assist  FIM - Locomotion: Wheelchair Distance: 150 Locomotion: Wheelchair: 0: Activity did not occur FIM - Locomotion: Ambulation Locomotion: Ambulation Assistive Devices: Occupational hygienist Ambulation/Gait Assistance: 5: Supervision Locomotion: Ambulation: 5: Travels 150 ft or more with supervision/safety issues  Comprehension Comprehension Mode: Auditory Comprehension: 5-Follows basic conversation/direction: With no assist  Expression Expression Mode: Verbal Expression: 5-Expresses complex 90% of the time/cues < 10% of the time  Social Interaction Social Interaction: 5-Interacts appropriately 90% of the time - Needs monitoring or encouragement for participation or interaction.  Problem Solving Problem Solving Mode: Not assessed Problem Solving: 5-Solves basic 90% of the time/requires cueing < 10% of the time  Memory Memory Mode: Not assessed Memory: 6-More than reasonable amt of time  Medical Problem List and Plan:  1. DVT Prophylaxis/Anticoagulation: Pharmaceutical: Lovenox  2. Pain Management: N/A  3. Mood: Offer ego support as with concerns/anxiety regarding loss of independence. LCSW to follow for evaluation.  4. Neuropsych: This patient is capable of making decisions on his/her own behalf.  5. H/o seizures: On keppra bid.  6. HTN: Blood pressures remain elevated. Started on norvasc Friday which seems to have helped.  LOS (Days) 7 A FACE TO FACE EVALUATION WAS PERFORMED  KIRSTEINS,ANDREW E 05/20/2012 7:08 AM

## 2012-05-20 NOTE — Progress Notes (Signed)
Physical Therapy Discharge Summary  Patient Details  Name: Kyle Dudley MRN: [id number] Date of Birth: [birth year]  Today's Date: 05/20/2012 Time: 1115-1200 and 1400-1453 Time Calculation (min): 45 min and 53 min  Patient has met 11 of 11 long term goals due to improved activity tolerance, improved balance, improved postural control, increased strength, ability to compensate for deficits, functional use of  right upper extremity and right lower extremity, improved attention, improved awareness and improved coordination.  Patient to discharge at an ambulatory level Modified Independent.   Patient's care partner not necessary secondary to patient discharging at mod I level to provide the necessary assistance at discharge.  Reasons goals not met: N/A, patient met all LTGs  Recommendation:  Patient will benefit from ongoing skilled PT services in home health setting to continue to advance safe functional mobility, address ongoing impairments in balance, strength, coordination, gait, and minimize fall risk.  Equipment: SBQC  Reasons for discharge: treatment goals met and discharge from hospital  Patient/family agrees with progress made and goals achieved: Yes  Skilled Interventions: AM Session: Patient performed floor transfer with modified independence and was able to recall indications vs. Contraindications for attempting floor transfer vs. Calling EMS.  PM Session: Patient performed car transfer with [facility] and modified independence.  PT Discharge Precautions/Restrictions Precautions Precautions: None Restrictions Weight Bearing Restrictions: No Pain Pain Assessment Pain Assessment: No/denies pain Pain Score: 0-No pain Vision/Perception  Vision - History Baseline Vision: No visual deficits Patient Visual Report: No change from baseline  Cognition Overall Cognitive Status: Within Functional Limits for tasks assessed Arousal/Alertness: Awake/alert Orientation Level: Oriented  X4 Sensation Sensation Light Touch: Appears Intact Proprioception: Appears Intact Additional Comments: Proprioception intact B ankles and great toes. Coordination Gross Motor Movements are Fluid and Coordinated: Yes Fine Motor Movements are Fluid and Coordinated: No Coordination and Movement Description: Decreased speed and accuracy with rapid, alternating movements Heel Shin Test: No dysmetria noted R LE Motor  Motor Motor: Within Functional Limits Motor - Discharge Observations: No clonus noted B LE.  Mobility Bed Mobility Bed Mobility: Supine to Sit;Sit to Supine;Sitting - Scoot to Edge of Bed;Scooting to HOB Supine to Sit: 6: Modified independent (Device/Increase time);HOB flat Sitting - Scoot to Edge of Bed: 6: Modified independent (Device/Increase time) Sit to Supine: 6: Modified independent (Device/Increase time);HOB flat Scooting to HOB: 6: Modified independent (Device/Increase time) Transfers Sit to Stand: 6: Modified independent (Device/Increase time);With upper extremity assist;From bed;From chair/3-in-1 Stand to Sit: 6: Modified independent (Device/Increase time);With upper extremity assist;To bed;To chair/3-in-1 Locomotion  Ambulation Ambulation: Yes Ambulation/Gait Assistance: 6: Modified independent (Device/Increase time) Ambulation Distance (Feet): 175 Feet x2, >300' x3 Assistive device: Small based quad cane Ambulation/Gait Assistance Details: Patient performed gait training x175' in controlled environment with [facility], several small  bouts of 20-40' in home environment in ADL apartment (carpeted surface, negotiation of obstacles and thresholds) with SBQC, and in community environment of hospital lobby and gift shop with [facility]. Patient able to appropriately negotiate small confines of gift shop and able to negotiate all obstacles and people in lobby without any physical assistance or verbal cues. Gait Gait: Yes Gait Pattern: Step-through pattern;Decreased stride  length;Narrow base of support;Trunk flexed Stairs / Additional Locomotion Stairs: Yes Stairs Assistance: 6: Modified independent (Device/Increase time) Stair Management Technique: One rail Left;Step to pattern;Forwards Number of Stairs: 10 Height of Stairs: 6 Curb: 6: Modified independent (Device/increase time) (with [facility]) Wheelchair Mobility Wheelchair Mobility: No  Trunk/Postural Assessment  Cervical Assessment Cervical Assessment: Within Functional Limits Thoracic Assessment Thoracic Assessment:  Within Functional Limits Lumbar Assessment Lumbar Assessment: Within Functional Limits Postural Control Postural Control: Within Functional Limits  Balance Balance Balance Assessed: Yes Standardized Balance Assessment Standardized Balance Assessment: Berg Balance Test Berg Balance Test Sit to Stand: Able to stand without using hands and stabilize independently Standing Unsupported: Able to stand safely 2 minutes Sitting with Back Unsupported but Feet Supported on Floor or Stool: Able to sit safely and securely 2 minutes Stand to Sit: Sits safely with minimal use of hands Transfers: Able to transfer safely, minor use of hands Standing Unsupported with Eyes Closed: Able to stand 10 seconds safely Standing Ubsupported with Feet Together: Able to place feet together independently and stand 1 minute safely From Standing, Reach Forward with Outstretched Arm: Can reach confidently >25 cm (10") From Standing Position, Pick up Object from Floor: Able to pick up shoe safely and easily From Standing Position, Turn to Look Behind Over each Shoulder: Looks behind from both sides and weight shifts well Turn 360 Degrees: Able to turn 360 degrees safely but slowly Standing Unsupported, Alternately Place Feet on Step/Stool: Able to stand independently and safely and complete 8 steps in 20 seconds Standing Unsupported, One Foot in Front: Able to place foot tandem independently and hold 30  seconds Standing on One Leg: Able to lift leg independently and hold equal to or more than 3 seconds Total Score: 52/56, indicating patient is at low risk for falls (~25%). BERG Balance Test Score upon initial eval: 38/56 Static Sitting Balance Static Sitting - Balance Support: No upper extremity supported;Feet supported Static Sitting - Level of Assistance: 7: Independent Dynamic Sitting Balance Dynamic Sitting - Balance Support: No upper extremity supported;Feet supported;During functional activity Dynamic Sitting - Level of Assistance: 6: Modified independent (Device/Increase time) Dynamic Sitting - Balance Activities: Lateral lean/weight shifting;Forward lean/weight shifting Static Standing Balance Static Standing - Balance Support: No upper extremity supported;Left upper extremity supported;During functional activity Static Standing - Level of Assistance: 6: Modified independent (Device/Increase time) Dynamic Standing Balance Dynamic Standing - Balance Support: Left upper extremity supported Dynamic Standing - Level of Assistance: 6: Modified independent (Device/Increase time) Extremity Assessment  RLE Assessment RLE Assessment: Within Functional Limits RLE Strength RLE Overall Strength Comments: Grossly 4/5 to 4+/5 LLE Assessment LLE Assessment: Within Functional Limits (grossly 4+/5)  See FIM for current functional status  Corona Popovich S Kyle Dudley S. Kyle Dudley, PT, DPT  05/20/2012, 12:04 PM

## 2012-05-20 NOTE — Progress Notes (Signed)
Occupational Therapy Discharge Summary  Patient Details  Name: Kyle Dudley MRN: [id number] Date of Birth: [birth year]  Today's Date: 05/20/2012 Time: 8469-6295 Time Calculation (min): 55 min  Patient has met 9 of 9 long term goals due to improved activity tolerance, improved balance, postural control, ability to compensate for deficits, functional use of  RIGHT upper extremity, improved attention, improved awareness and improved coordination.  Patient to discharge at overall Modified Independent level.  Patient lives alone however has family members who will be checking on him daily.   Reasons goals not met: n/a  Recommendation:  Patient discharging home with HEP to focus on fine motor skills to continue to advance functional skills in the area of BADL and iADL.   Equipment: TTB  Reasons for discharge: treatment goals met  Patient/family agrees with progress made and goals achieved: Yes  OT Discharge Precautions/Restrictions    General   Vital Signs Therapy Vitals Temp: 97.2 F (36.2 C) Temp src: Oral Resp: 16 BP: 118/84 mmHg Patient Position, if appropriate: Lying Oxygen Therapy SpO2: 97 % O2 Device: None (Room air) Pain   No c/o pain.  ADL ADL Eating: Independent Where Assessed-Eating: Chair Grooming: Minimal assistance;Other (comment) Where Assessed-Grooming: Sitting at sink Upper Body Bathing: Supervision/safety Where Assessed-Upper Body Bathing: Shower Lower Body Bathing: Supervision/safety Where Assessed-Lower Body Bathing: Shower Upper Body Dressing: Supervision/safety Where Assessed-Upper Body Dressing: Wheelchair Lower Body Dressing: Supervision/safety Where Assessed-Lower Body Dressing: Wheelchair Toileting: Supervision/safety Where Assessed-Toileting: Teacher, adult education: Close supervision Toilet Transfer Method: Surveyor, minerals: Chiropractor Transfer: Close supervison Film/video editor: Close  supervision Film/video editor Method: Warden/ranger: Information systems manager with back;Grab bars  Skilled Therapeutic Intervention: Therapy session focused on ADL retraining, dynamic standing balance, home management tasks, FM skills. Patient getting dressed upon OT arrival and demonstrated ability to fasten all buttons with increased time. Patient practiced ambulated throughout room to "clean up" and place items in drawers to simulate home environment. Patient able to determine when rest breaks were needed without cues. Patient completed tub transfer using TTB at Mod I level. Patient completed light meal prep task at stove top with supervision. Patient demonstrated good safety throughout kitchen. Patient demonstrated good functional use of RUE when open packages. Patient completed 9 hole peg test with Rt hand: 2:01 and Lt hand: 41 sec. Patient educated on importance of completing HEP 2x/day at home. Patient reported understanding and agreed to continue to complete it. Patient reported no questions or concerns about discharge home at this time.  See FIM for current functional status  Daneil Dan 05/20/2012, 9:38 AM

## 2012-05-20 NOTE — Progress Notes (Signed)
Speech Language Pathology Daily Session Note & Discharge Summary   Patient Details  Name: Kyle Dudley MRN: [id number] Date of Birth: Feb 08, 1940  Today's Date: 05/20/2012 Time: 1191-4782 Time Calculation (min): 32 min  Short Term Goals: Week 1: SLP Short Term Goal 1 (Week 1): Patient will demonstrate complex problem solving with supervision level verbal cues. SLP Short Term Goal 2 (Week 1): Patient will utilize word finding strategies during conversational expression with min verbal cues. SLP Short Term Goal 3 (Week 1): Patient will utilize external aids to assist with recall of daily informaiton with supervision verbal cues.  Skilled Therapeutic Interventions: Skilled treatment session focused on addressing cognition goals.  SLP facilitated session with initial supervision level verbal instructions on use of medication box.  Patient then mod I with medication management task and use of previously created written aid.  Patient demonstrated good self-monitoring and correcting.  SLP also facilitated session with following cooking direction task which patient completed with increased wait time.  At end of session patient requested that SLP hole punch papers and assist with getting them into his patient binder for discharge tomorrow.     FIM:  Comprehension Comprehension Mode: Auditory Comprehension: 6-Follows complex conversation/direction: With extra time/assistive device Expression Expression Mode: Verbal Expression: 6-Expresses complex ideas: With extra time/assistive device Social Interaction Social Interaction: 6-Interacts appropriately with others with medication or extra time (anti-anxiety, antidepressant). Problem Solving Problem Solving: 6-Solves complex problems: With extra time Memory Memory: 6-Assistive device: No helper  Pain Pain Assessment Pain Assessment: No/denies pain  Therapy/Group: Individual Therapy   Speech Language Pathology Discharge Summary  Patient Details   Name: Kyle Dudley MRN: [id number] Date of Birth: 07/05/1940  Today's Date: 05/20/2012  Patient has met 3 of 3 long term goals.  Patient to discharge at overall Modified Independent level.  Reasons goals not met: n/a   Clinical Impression/Discharge Summary: Patient met 3 out of 3 long term goals during CIR stay due to gains in word retrieval, recall with use of external aids and complex problem solving with medication management tasks.  Goals met; patient education completed and patient is overall Mod I for tasks.  As a result, no follow-up SLP services are recommended at this time.    Care Partner:  Caregiver Able to Provide Assistance: No  Type of Caregiver Assistance:  (N/A)  Recommendation:  None  Rationale for SLP Follow Up:  (N/A)   Equipment:  (N/A)   Reasons for discharge: Discharged from hospital;Treatment goals met   Patient/Family Agrees with Progress Made and Goals Achieved: Yes   See FIM for current functional status  Charlane Ferretti., CCC-SLP [phone]  Kyle Dudley 05/20/2012, 5:01 PM

## 2012-05-20 NOTE — Discharge Summary (Signed)
Physician Discharge Summary  Patient ID: Kyle Dudley MRN: [id number] DOB/AGE: 1940-10-29 72 y.o.  Admit date: 05/13/2012 Discharge date: 05/21/2012  Discharge Diagnoses:  Principal Problem:   Acute ischemic left MCA stroke Active Problems:   Seizure disorder   Unspecified essential hypertension   Discharged Condition: Good  Labs:  Basic Metabolic Panel:  Recent Labs Lab 05/16/12 0723 05/20/12 0558  NA 136  --   K 4.0  --   CL 100  --   CO2 27  --   GLUCOSE 91  --   BUN 18  --   CREATININE 0.74 0.80  CALCIUM 9.4  --     CBC:  Recent Labs Lab 05/16/12 0723  WBC 6.5  NEUTROABS 2.9  HGB 15.0  HCT 43.4  MCV 91.2  PLT 229    CBG:  Recent Labs Lab 05/16/12 1643  GLUCAP 155*    Brief HPI:   Kyle Dudley is a 72 y.o. right-handed male with history of seizure disorder maintained on Keppra and tobacco abuse. Admitted 05/10/2012 with right-sided weakness. 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 He continued with right hemiparesis with balance impairements. Therapy team recommended CIR for progressive therapies.    Hospital Course: Kyle Dudley was admitted to rehab 05/13/2012 for inpatient therapies to consist of PT, ST and OT at least three hours five days a week. Past admission physiatrist, therapy team and rehab RN have worked together to provide customized collaborative inpatient rehab. Blood pressures have been monitored on bid basis and have been well controlled.  Po intake has been good and he's continent of bowel and bladder. He has been seizure free on his home dose of keppra.  He's had improve in RUE and RLE strength. He has made good progress and follow up home therapies to continue past discharge.    Rehab course: During patient's stay in rehab weekly team conferences were held to monitor patient's progress, set goals and discuss barriers to discharge. Speech therapy has  focused on complex problem solving. Patient is able to use strategies as well as external aids for recall and complex problem solving. He's showing improvement in function use of RUE and is independent for ADL tasks. He is able to ambulate in controlled environment with use of cane.   Disposition: Home  Diet: Heart Healthy  Special Instructions: 1. Social worker will call you regarding changing  over to a medical doctor [phone])  2. [facility] to provide HHPT Phone: [phone]      Future Appointments Provider Department Dept Phone   06/07/2012 1:00 PM Erick Colace, MD [facility] [phone]       Medication List    TAKE these medications       amLODipine 2.5 MG tablet  Commonly known as:  NORVASC  Take 1 tablet (2.5 mg total) by mouth daily.     aspirin 81 MG chewable tablet  Chew 1 tablet (81 mg total) by mouth daily.     levETIRAcetam 500 MG 24 hr tablet  Commonly known as:  KEPPRA XR  Take 2,000 mg by mouth at bedtime.     multivitamin with minerals Tabs  Take 1 tablet by mouth daily.           Follow-up Information   Follow up with Erick Colace, MD On 06/07/2012. (Be there at 12:30 for 1 pm appointment)  Contact information:   [address] [place] [address] [phone]       Follow up with [facility]. Call in 2 days. (stroke follow up in 6 -8 weeks. )    Contact information:   [address] [place] [address] [phone]      Signed: Jacquelynn Cree 05/20/2012, 5:25 PM

## 2012-05-20 NOTE — Progress Notes (Signed)
Social Work  Discharge Note  The overall goal for the admission was met for:   Discharge location: Yes - home alone with intermittent assist of local family  Length of Stay: Yes - 8 days  Discharge activity level: Yes - mod i overall  Home/community participation: Yes  Services provided included: MD, RD, PT, OT, SLP, RN, TR, Pharmacy and SW  Financial Services: Medicare and Medicaid  Follow-up services arranged: Home Health: RN, PT via [facility], DME: SBQC, tub bench via [facility] and Patient/Family has no preference for HH/DME agencies  Comments (or additional information):  Patient/Family verbalized understanding of follow-up arrangements: Yes  Individual responsible for coordination of the follow-up plan: patient  Confirmed correct DME delivered: Eloise Picone 05/20/2012    Shawnte Winton

## 2012-05-21 DIAGNOSIS — I6330 Cerebral infarction due to thrombosis of unspecified cerebral artery: Secondary | ICD-10-CM

## 2012-05-21 NOTE — Progress Notes (Signed)
Patient ID: Kyle Dudley, male   DOB: [birth year], 72 y.o.   MRN: [id number] Patient ID: Kyle Dudley, male   DOB: 11/06/1940, 72 y.o.   MRN: [id number] Subjective/Complaints:  4/26.  Alert and appropriate and anxious for discharge today. Discharge planning completed.  72 y.o. right-handed male with history of seizure disorder (maintained on Keppra) and tobacco abuse. Admitted 05/10/2012 with right-sided weakness. 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 MRA of the head with no significant stenosis. 2D echo done revealing EF 55-65% and no wall abnormality. Carotid dopplers without ICA stenosis and incidental findings of bilateral thyroid cysts  No problems overnite  A 12 point review of systems has been performed and if not noted above is otherwise negative.   Impression status post left MCA ischemic stroke. Hypertension History seizure disorder  Stable for discharge  Objective: Vital Signs: Blood pressure 118/73, pulse 98, temperature 97.5 F (36.4 C), temperature source Oral, resp. rate 18, height 5\' 5"  (1.651 m), weight 102 lb 1.6 oz (46.312 kg), SpO2 99.00%. No results found. No results found for this basename: WBC, HGB, HCT, PLT,  in the last 72 hours  Recent Labs  05/20/12 0558  CREATININE 0.80   CBG (last 3)  No results found for this basename: GLUCAP,  in the last 72 hours  Wt Readings from Last 3 Encounters:  05/18/12 102 lb 1.6 oz (46.312 kg)  05/11/12 105 lb 13.1 oz (48 kg)    Physical Exam:  Constitutional: He is oriented to person, place, and time. He appears well-developed.  Thin male  HENT: Dentition poor. Oral mucosa pink and moist.  Head: Normocephalic and atraumatic.  Eyes: Pupils are equal, round, and reactive to light.  Cardiovascular: Normal rate and regular rhythm. No murmurs  Pulmonary/Chest: Effort normal and breath sounds normal. No wheezes or rales.  Abdominal: Soft. Bowel sounds are normal. Non distended  Musculoskeletal: He  exhibits no edema and no tenderness.  Neurological: He is alert and oriented to person, place, and time. A little slow to arouse this early am. Speech is slightly diminished. Reasonable but basic insight and awareness. Follows commands without difficulty. RUE weakness 4-/5 Delt,Bi,Tri, 3- in grip and finger ext with decreased coordination. + pronator drift and diminished FMC. RUE is 4/5. RLE is grossly 4+/5 with diminished FMC. No sensory deficits.  Skin: Skin is warm and dry.    Assessment/Plan: 1. Functional deficits secondary to left centrum semiovale thrombotic infarct ready for D/C in am after MD rounds FIM: FIM - Bathing Bathing Steps Patient Completed: Chest;Right Arm;Left Arm;Abdomen;Front perineal area;Left lower leg (including foot);Right lower leg (including foot);Buttocks;Right upper leg;Left upper leg Bathing: 6: Assistive device (Comment) (complete with shower chair)  FIM - Upper Body Dressing/Undressing Upper body dressing/undressing steps patient completed: Thread/unthread right sleeve of front closure shirt/dress;Thread/unthread left sleeve of front closure shirt/dress;Pull shirt around back of front closure shirt/dress;Pull shirt over trunk;Button/unbutton shirt Upper body dressing/undressing: 6: More than reasonable amount of time FIM - Lower Body Dressing/Undressing Lower body dressing/undressing steps patient completed: Thread/unthread left pants leg;Fasten/unfasten pants;Thread/unthread right pants leg;Thread/unthread right underwear leg;Thread/unthread left underwear leg;Pull underwear up/down;Pull pants up/down;Don/Doff left shoe;Don/Doff right shoe;Don/Doff right sock;Don/Doff left sock;Fasten/unfasten right shoe;Fasten/unfasten left shoe Lower body dressing/undressing: 6: More than reasonable amount of time  FIM - Toileting Toileting steps completed by patient: Performs perineal hygiene;Adjust clothing prior to toileting;Adjust clothing after toileting Toileting  Assistive Devices: Grab bar or rail for support Toileting: 5: Supervision: Safety issues/verbal cues  FIM -  Diplomatic Services operational officer Devices: Therapist, music Transfers: 5-To toilet/BSC: Supervision (verbal cues/safety issues);5-From toilet/BSC: Supervision (verbal cues/safety issues) (completed in standing )  FIM - Bed/Chair Transfer Bed/Chair Transfer Assistive Devices: Arm rests;Cane Bed/Chair Transfer: 6: Supine > Sit: No assist;6: Sit > Supine: No assist;6: Bed > Chair or W/C: No assist;6: Chair or W/C > Bed: No assist  FIM - Locomotion: Wheelchair Distance: 150 Locomotion: Wheelchair: 0: Activity did not occur FIM - Locomotion: Ambulation Locomotion: Ambulation Assistive Devices: Occupational hygienist Ambulation/Gait Assistance: 6: Modified independent (Device/Increase time) Locomotion: Ambulation: 6: Travels 150 ft or more with assistive device/no helper  Comprehension Comprehension Mode: Auditory Comprehension: 7-Follows complex conversation/direction: With no assist  Expression Expression Mode: Verbal Expression: 7-Expresses complex ideas: With no assist  Social Interaction Social Interaction: 7-Interacts appropriately with others - No medications needed.  Problem Solving Problem Solving Mode: Not assessed Problem Solving: 7-Solves complex problems: Recognizes & self-corrects  Memory Memory Mode: Not assessed Memory: 7-Complete Independence: No helper  Medical Problem List and Plan:  1. DVT Prophylaxis/Anticoagulation: Pharmaceutical: Lovenox  2. Pain Management: N/A  3. Mood: Offer ego support as with concerns/anxiety regarding loss of independence. LCSW to follow for evaluation.  4. Neuropsych: This patient is capable of making decisions on his/her own behalf.  5. H/o seizures: On keppra bid.  6. HTN: Blood pressures remain elevated. Started on norvasc Friday which seems to have helped.  LOS (Days) 8 A FACE TO FACE EVALUATION WAS  PERFORMED  Rogelia Boga 05/21/2012 8:22 AM

## 2012-05-21 NOTE — Plan of Care (Signed)
Problem: RH KNOWLEDGE DEFICIT Goal: RH STG INCREASE KNOWLEDGE OF HYPERTENSION Independent,verify understanding via teach back method  Outcome: Adequate for Discharge Able to state some risk factors for HTN as well as importance of taking medications and which medications are for HTN.

## 2012-05-27 ENCOUNTER — Telehealth: Payer: Self-pay | Admitting: *Deleted

## 2012-05-27 NOTE — Telephone Encounter (Signed)
Requesting verbal approval to see patient 2wk3 for neuro rehab.  Approval given.

## 2012-06-02 ENCOUNTER — Telehealth: Payer: Self-pay | Admitting: Neurology

## 2012-06-02 NOTE — Telephone Encounter (Signed)
Kyle Dudley from [facility], pt needing post stroke f/u appt [phone]. Notes given to scheduler to call.

## 2012-06-02 NOTE — Telephone Encounter (Signed)
Ok to schedule routine stroke f/u visit

## 2012-06-03 NOTE — Telephone Encounter (Signed)
I gave to scheduler to call pt. (Kyle Dudley).

## 2012-06-07 ENCOUNTER — Encounter: Payer: Self-pay | Admitting: Physical Medicine & Rehabilitation

## 2012-06-07 ENCOUNTER — Encounter: Payer: Medicare Other | Attending: Physical Medicine & Rehabilitation

## 2012-06-07 ENCOUNTER — Ambulatory Visit (HOSPITAL_BASED_OUTPATIENT_CLINIC_OR_DEPARTMENT_OTHER): Payer: Medicare Other | Admitting: Physical Medicine & Rehabilitation

## 2012-06-07 VITALS — BP 158/95 | HR 61 | Resp 14 | Ht 65.0 in | Wt 107.2 lb

## 2012-06-07 DIAGNOSIS — Z7982 Long term (current) use of aspirin: Secondary | ICD-10-CM | POA: Insufficient documentation

## 2012-06-07 DIAGNOSIS — I63512 Cerebral infarction due to unspecified occlusion or stenosis of left middle cerebral artery: Secondary | ICD-10-CM

## 2012-06-07 DIAGNOSIS — Z87891 Personal history of nicotine dependence: Secondary | ICD-10-CM | POA: Insufficient documentation

## 2012-06-07 DIAGNOSIS — R269 Unspecified abnormalities of gait and mobility: Secondary | ICD-10-CM | POA: Insufficient documentation

## 2012-06-07 DIAGNOSIS — Z79899 Other long term (current) drug therapy: Secondary | ICD-10-CM | POA: Insufficient documentation

## 2012-06-07 DIAGNOSIS — I6350 Cerebral infarction due to unspecified occlusion or stenosis of unspecified cerebral artery: Secondary | ICD-10-CM

## 2012-06-07 DIAGNOSIS — I6330 Cerebral infarction due to thrombosis of unspecified cerebral artery: Secondary | ICD-10-CM | POA: Insufficient documentation

## 2012-06-07 DIAGNOSIS — G40909 Epilepsy, unspecified, not intractable, without status epilepticus: Secondary | ICD-10-CM | POA: Insufficient documentation

## 2012-06-07 NOTE — Patient Instructions (Addendum)
Continue home health therapy See primary care doctor to keep blood pressure under control See neurologist to followup on stroke prevention as well as seizure medicines You'll need to talk about driving with your neurologist. No driving at this time.

## 2012-06-07 NOTE — Progress Notes (Signed)
  Subjective:    Patient ID: Kyle Dudley, male    DOB: [birth year], 72 y.o.   MRN: [id number]  HPI Kyle Dudley is a 72 y.o. right-handed male with history of seizure disorder maintained on Keppra and tobacco abuse. Admitted 05/10/2012 with right-sided weakness. 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 He continued with right hemiparesis with balance impairements. Therapy team recommended CIR for progressive therapies.  Discharge from CIR now getting home health therapy. Has seen primary care physician. Has not made appointment with neurology however neurology office has a telephone note in EPIC to contact patient  Pain Inventory Average Pain 0 Pain Right Now 0 My pain is no pain  In the last 24 hours, has pain interfered with the following? General activity 0 Relation with others 0 Enjoyment of life 0 What TIME of day is your pain at its worst? no pain Sleep (in general) Good  Pain is worse with: no pain Pain improves with: no pain Relief from Meds: no pain  Mobility use a cane  Function retired  Neuro/Psych trouble walking  Prior Studies Any changes since last visit?  no  Physicians involved in your care Any changes since last visit?  no   Family History  Problem Relation Age of Onset  . Stroke Sister     2000   History   Social History  . Marital Status: Single    Spouse Name: N/A    Number of Children: N/A  . Years of Education: N/A   Social History Main Topics  . Smoking status: Former Smoker -- 0.50 packs/day for 55 years    Types: Cigarettes    Quit date: 05/08/2012  . Smokeless tobacco: Never Used  . Alcohol Use: No  . Drug Use: None  . Sexually Active: None   Other Topics Concern  . None   Social History Narrative  . None   History reviewed. No pertinent past surgical history. Past Medical History  Diagnosis Date  . Seizures   . Stroke    BP  158/95  Pulse 61  Resp 14  Ht 5\' 5"  (1.651 m)  Wt 107 lb 3.2 oz (48.626 kg)  BMI 17.84 kg/m2  SpO2 100%    Review of Systems  Musculoskeletal: Positive for gait problem.  All other systems reviewed and are negative.       Objective:   Physical Exam  4/5 strength in bilateral upper and lower remedies. Ambulates with a quad cane no evidence of toe drag or knee instability Gen. No acute distress Mood and affect are appropriate No pain with shoulder range of motion     Assessment & Plan:  1. Thrombotic infarct left centrum semiovale. No residual weakness mild balance disorder. Continue home health therapy. This will only be for another week or 2. No rehabilitation medicine followup needed Followup with neurology Followup with primary care

## 2012-06-08 ENCOUNTER — Telehealth: Payer: Self-pay | Admitting: Neurology

## 2012-06-08 NOTE — Telephone Encounter (Signed)
I called and spoke to pt and his sister, Agustin Cree also there, relating to appt date and time of his appt 07-06-12 at 1430.    [phone].  He stated he wrote this down or Darlene did on his calendar.   He asked about who was going to take him, and I said better for family member to come with him.   He would talk with Daniella with [facility].

## 2012-07-06 ENCOUNTER — Encounter: Payer: Self-pay | Admitting: Neurology

## 2012-07-06 ENCOUNTER — Ambulatory Visit (INDEPENDENT_AMBULATORY_CARE_PROVIDER_SITE_OTHER): Payer: Medicare Other | Admitting: Neurology

## 2012-07-06 VITALS — BP 161/87 | HR 64 | Temp 98.0°F | Ht 64.0 in | Wt 112.0 lb

## 2012-07-06 DIAGNOSIS — I6350 Cerebral infarction due to unspecified occlusion or stenosis of unspecified cerebral artery: Secondary | ICD-10-CM

## 2012-07-06 NOTE — Patient Instructions (Signed)
Continue aspirin for stroke prevention as well as strict control of hypertension with blood pressure goal below 130/90. I have complimented him on smoking cessation. Return for followup in 3 months with Larita Fife, NP

## 2012-07-06 NOTE — Progress Notes (Signed)
[facility]  PATIENT: Kyle Dudley DOB: 1940-06-14   HISTORY FROM: Patient and hospital chart REASON FOR VISIT: Stroke f/u  HISTORY OF PRESENT ILLNESS:  Kyle Dudley is an 72 y.o. male, right handed with a past medical history significant for an isolated seizure four years ago for which he takes keppra 2,000 mg daily, brought to [facility] by his family due to right sided weakness and dragging ofthe right leg. Mr. Dunne stated that he never had similar symptoms before but this past Monday 05/09/2012 he woke up and noticed that he was not able to grab with his right hand and the right leg was weak. He did not experienced face weakness, headache, vertigo, double vision, difficulty swallowing, slurred speech, language or vision impairment. Stated that he has been dragging the right leg ever since. CT brain revealed age indeterminate hypodensity in the left corona radiata which was confirmed to be an acute lacunar infarct right CR on MRI-DWI. MRA brain unimpressive. Patient denies medication side effects, with no signs of bleeding.  Patient has completed outpatient therapy. He uses a cane to walk long distances. He states his blood pressure is running well at home though it is elevated at 161/87 in the office today. He does have upcoming visit with his primary physician this Friday. He has successfully quit smoking since his stroke. He is wanting to drive. He has history of a seizure 4 years ago and has been on Keppra and simply tolerating it well without side effects.  REVIEW OF SYSTEMS: Full 14 system review of systems performed and notable only for: constitutional: Blurred vision  cardiovascular: Swelling in legs,  respiratory: snoring,  endocrine: increased thirst  ear/nose/throat: Hearing loss  musculoskeletal: Joint pain, aching muscles  skin: Itching  genitourinary: Incontinence  allergy/immunology: Allergies, runny nose, skin sensitivity  neurological: Memory loss, confusion,  weakness, dizziness, tremor  sleep: Insomnia, snoring  psychiatric: Depression, anxiety, not enough sleep, change in appetite.   ALLERGIES: No Known Allergies  HOME MEDICATIONS: Outpatient Prescriptions Prior to Visit  Medication Sig Dispense Refill  . amLODipine (NORVASC) 2.5 MG tablet Take 1 tablet (2.5 mg total) by mouth daily.  30 tablet  1  . aspirin 81 MG chewable tablet Chew 1 tablet (81 mg total) by mouth daily.      Marland Kitchen levETIRAcetam (KEPPRA XR) 500 MG 24 hr tablet Take 2,000 mg by mouth at bedtime.       No facility-administered medications prior to visit.    PAST MEDICAL HISTORY: Past Medical History  Diagnosis Date  . Seizures   . Stroke     PAST SURGICAL HISTORY: No past surgical history on file.  FAMILY HISTORY: Family History  Problem Relation Age of Onset  . Stroke Sister     2000    SOCIAL HISTORY: History   Social History  . Marital Status: Single    Spouse Name: N/A    Number of Children: N/A  . Years of Education: N/A   Occupational History  . Not on file.   Social History Main Topics  . Smoking status: Former Smoker -- 0.50 packs/day for 55 years    Types: Cigarettes    Quit date: 05/08/2012  . Smokeless tobacco: Never Used  . Alcohol Use: No  . Drug Use: Not on file  . Sexually Active: Not on file   Other Topics Concern  . Not on file   Social History Narrative  . No narrative on file     PHYSICAL EXAM  There were no vitals filed for this visit. There is no weight on file to calculate BMI.  GENERAL EXAM: Patient is in no distress, well developed and well groomed. HEAD: Symmetric facial features. EARS, NOSE, and THROAT: Normal.  NECK: Supple, no JVD RESPIRATORY: Lungs CTA. CARDIOVASCULAR: Regular rate and rhythm, no murmurs, no carotid bruits SKIN: No rash, no bruising  NEUROLOGIC: MENTAL STATUS: awake, alert and oriented to person, place and time, language fluent, comprehension intact, naming intact CRANIAL NERVE: no  papilledema on fundoscopic exam, pupils equal and reactive to light, visual fields full to confrontation, extraocular muscles intact, no nystagmus, facial sensation and strength symmetric, uvula midline, shoulder shrug symmetric, tongue midline. MOTOR: normal bulk and tone, full strength in the BUE, BLE, decreased  fine finger movements on  Right.drags right leg. Increased tone right leg. SENSORY: normal and symmetric to light touch, pinprick, temperature, vibration COORDINATION: finger-nose-finger REFLEXES: deep tendon reflexes present and symmetric 2+,  GAIT/STATION: narrow based gait; able to walk on toes, heels and tandem; romberg is negative. No assistive device.   DIAGNOSTIC DATA (LABS, IMAGING, TESTING) - I reviewed patient records, labs, notes, testing and imaging myself where available.  Lab Results  Component Value Date   WBC 6.5 05/16/2012   HGB 15.0 05/16/2012   HCT 43.4 05/16/2012   MCV 91.2 05/16/2012   PLT 229 05/16/2012      Component Value Date/Time   NA 136 05/16/2012 0723   K 4.0 05/16/2012 0723   CL 100 05/16/2012 0723   CO2 27 05/16/2012 0723   GLUCOSE 91 05/16/2012 0723   BUN 18 05/16/2012 0723   CREATININE 0.80 05/20/2012 0558   CALCIUM 9.4 05/16/2012 0723   PROT 6.6 05/16/2012 0723   ALBUMIN 3.5 05/16/2012 0723   AST 33 05/16/2012 0723   ALT 25 05/16/2012 0723   ALKPHOS 78 05/16/2012 0723   BILITOT 0.6 05/16/2012 0723   GFRNONAA 88* 05/20/2012 0558   GFRAA >90 05/20/2012 0558   Lab Results  Component Value Date   CHOL 143 05/11/2012   HDL 41 05/11/2012   LDLCALC 93 05/11/2012   TRIG 47 05/11/2012   CHOLHDL 3.5 05/11/2012   Lab Results  Component Value Date   HGBA1C 4.9 05/11/2012   No results found for this basename: VITAMINB12   No results found for this basename: TSH   Ct Head Wo Contrast 05/10/2012    Chronic small vessel ischemia. Age indeterminate area of involvement in the left corona radiata (image 16). Otherwise no evidence of acute intracranial  abnormality.  Mr Angiogram Head Wo Contrast 05/10/2012  No significant intracranial stenosis.   Mr Brain Wo Contrast 05/10/2012  No significant intracranial stenosis.   ECHO: Left ventricle: ejection fraction was in the range of 55% to 65%. No source of embolus.  Carotid doppler: No significant extracranial carotid artery stenosis demonstrated. Vertebrals are patent with antegrade flow.  Incidental finding: bilateral thyroid cysts.   ASSESSMENT AND PLAN  72 year old AA male with left centrum semioval infarct.  Infarct felt to be thrombotic due to small vessel disease. In April 2014. Patient with resultant right hemiparesis.  ECHO negative for thrombus, doppler no significant ICA stenosis. MRA no stenosis. History of seizures, on Keppra.  LDL 93. HB-A1c 4.9 .   Continue aspirin 81 mg orally every day  for secondary stroke prevention and maintain strict control of hypertension with blood pressure goal below 140/90, diabetes with hemoglobin A1c goal below 6.5% and lipids with LDL cholesterol goal below 100  mg/dL.  Patient was advised to stop smoking. Lipid panel to be checked every 6 months. Followup in the future with me in 3 months.   LYNN LAM NP-C 07/06/2012, 2:00 PM  [facility] [address], [address] [place], [place] [address] [phone]  I have personally examined this patient, reviewed pertinent data, developed plan of care and discussed with patient and agree with above. Delia Heady, MD

## 2012-10-05 ENCOUNTER — Telehealth: Payer: Self-pay | Admitting: Nurse Practitioner

## 2012-10-05 NOTE — Telephone Encounter (Signed)
Call pt about his appt. And the pt acknowledge that he was coming. °

## 2012-10-06 ENCOUNTER — Ambulatory Visit (INDEPENDENT_AMBULATORY_CARE_PROVIDER_SITE_OTHER): Payer: Medicare Other | Admitting: Nurse Practitioner

## 2012-10-06 ENCOUNTER — Encounter: Payer: Self-pay | Admitting: Nurse Practitioner

## 2012-10-06 VITALS — BP 145/85 | HR 69 | Temp 97.1°F | Ht 63.0 in | Wt 118.0 lb

## 2012-10-06 DIAGNOSIS — I6350 Cerebral infarction due to unspecified occlusion or stenosis of unspecified cerebral artery: Secondary | ICD-10-CM

## 2012-10-06 DIAGNOSIS — G40909 Epilepsy, unspecified, not intractable, without status epilepticus: Secondary | ICD-10-CM

## 2012-10-06 DIAGNOSIS — I63512 Cerebral infarction due to unspecified occlusion or stenosis of left middle cerebral artery: Secondary | ICD-10-CM

## 2012-10-06 DIAGNOSIS — I10 Essential (primary) hypertension: Secondary | ICD-10-CM

## 2012-10-06 NOTE — Progress Notes (Signed)
[facility]  PATIENT: Kyle Dudley DOB: 1940-10-21   HISTORY FROM: patient, chart REASON FOR VISIT: routine follow up  HISTORY OF PRESENT ILLNESS:  Kyle Dudley is an 72 y.o. male, right handed with a past medical history significant for an isolated seizure four years ago for which he takes keppra 2,000 mg daily, brought to [facility] by his family due to right sided weakness and dragging ofthe right leg. Mr. Keefe stated that he never had similar symptoms before but this past Monday 05/09/2012 he woke up and noticed that he was not able to grab with his right hand and the right leg was weak. He did not experienced face weakness, headache, vertigo, double vision, difficulty swallowing, slurred speech, language or vision impairment. Stated that he has been dragging the right leg ever since. CT brain revealed age indeterminate hypodensity in the left corona radiata which was confirmed to be an acute lacunar infarct right CR on MRI-DWI. MRA brain unimpressive.  Patient denies medication side effects, with no signs of bleeding. Patient has completed outpatient therapy. He uses a cane to walk long distances. He states his blood pressure is running well at home though it is elevated at 161/87 in the office today. He does have upcoming visit with his primary physician this Friday. He has successfully quit smoking since his stroke. He is wanting to drive. He has history of a seizure 4 years ago and has been on Keppra and simply tolerating it well without side effects.  UPDATE 10/06/12 (LL): Reports that he quit smoking on May 08, 2012.  Has not smoked since.  BP has been well, 145/85 in office today.  Patient reports he still has mild weakness in right leg and decreased fine motor skills in right fingers, but other than that he is doing very well.  Has regular follow up visits at PCP, labwork done recently there, reports cholesterol was good.    REVIEW OF SYSTEMS: Full 14 system review of systems  performed and notable only for: all negative.  ALLERGIES: No Known Allergies  HOME MEDICATIONS: Outpatient Prescriptions Prior to Visit  Medication Sig Dispense Refill  . aspirin 81 MG chewable tablet Chew 1 tablet (81 mg total) by mouth daily.      Marland Kitchen levETIRAcetam (KEPPRA XR) 500 MG 24 hr tablet Take 2,000 mg by mouth at bedtime.      Marland Kitchen amLODipine (NORVASC) 2.5 MG tablet Take 1 tablet (2.5 mg total) by mouth daily.  30 tablet  1   No facility-administered medications prior to visit.    PAST MEDICAL HISTORY: Past Medical History  Diagnosis Date  . Seizures   . Stroke     PAST SURGICAL HISTORY: History reviewed. No pertinent past surgical history.  FAMILY HISTORY: Family History  Problem Relation Age of Onset  . Stroke Sister     2000  . Stroke Mother     SOCIAL HISTORY: History   Social History  . Marital Status: Single    Spouse Name: N/A    Number of Children: 2  . Years of Education: 9th   Occupational History  . Retired    Social History Main Topics  . Smoking status: Former Smoker -- 0.50 packs/day for 55 years    Types: Cigarettes    Quit date: 05/08/2012  . Smokeless tobacco: Never Used  . Alcohol Use: No  . Drug Use: Not on file  . Sexual Activity: Not on file   Other Topics Concern  . Not on file  Social History Narrative   Patient lives at home alone.   Caffeine Use: 5 cups of tea daily     PHYSICAL EXAM  Filed Vitals:   10/06/12 1358  BP: 145/85  Pulse: 69  Temp: 97.1 F (36.2 C)  TempSrc: Oral  Height: 5\' 3"  (1.6 m)  Weight: 118 lb (53.524 kg)   Body mass index is 20.91 kg/(m^2).  GENERAL EXAM: Patient is in no distress, well developed and well groomed.  HEAD: Symmetric facial features.  EARS, NOSE, and THROAT: Normal.  NECK: Supple, no JVD  RESPIRATORY: Lungs CTA.  CARDIOVASCULAR: Regular rate and rhythm, no murmurs, no carotid bruits  SKIN: No rash, no bruising   NEUROLOGIC:  MENTAL STATUS: awake, alert and oriented  to person, place and time, language fluent, comprehension intact, naming intact  CRANIAL NERVE:  pupils equal and reactive to light, visual fields full to confrontation, extraocular muscles intact, no nystagmus, facial sensation and strength symmetric, uvula midline, shoulder shrug symmetric, tongue midline.  MOTOR: normal bulk and tone, full strength in the BUE, BLE, decreased fine finger movements on Right. Mild dragging of right leg. Increased tone right leg.  SENSORY: normal and symmetric to light touch, pinprick COORDINATION: finger-nose-finger normal REFLEXES: deep tendon reflexes present and symmetric 2+,  GAIT/STATION: narrow based gait; unable to walk on toes, heels and tandem safely. romberg is negative. Using 4-prong cane for balance when outside of house.  DIAGNOSTIC DATA (LABS, IMAGING, TESTING) - I reviewed patient records, labs, notes, testing and imaging myself where available.  Lab Results  Component Value Date   WBC 6.5 05/16/2012   HGB 15.0 05/16/2012   HCT 43.4 05/16/2012   MCV 91.2 05/16/2012   PLT 229 05/16/2012      Component Value Date/Time   NA 136 05/16/2012 0723   K 4.0 05/16/2012 0723   CL 100 05/16/2012 0723   CO2 27 05/16/2012 0723   GLUCOSE 91 05/16/2012 0723   BUN 18 05/16/2012 0723   CREATININE 0.80 05/20/2012 0558   CALCIUM 9.4 05/16/2012 0723   PROT 6.6 05/16/2012 0723   ALBUMIN 3.5 05/16/2012 0723   AST 33 05/16/2012 0723   ALT 25 05/16/2012 0723   ALKPHOS 78 05/16/2012 0723   BILITOT 0.6 05/16/2012 0723   GFRNONAA 88* 05/20/2012 0558   GFRAA >90 05/20/2012 0558   Lab Results  Component Value Date   CHOL 143 05/11/2012   HDL 41 05/11/2012   LDLCALC 93 05/11/2012   TRIG 47 05/11/2012   CHOLHDL 3.5 05/11/2012   Lab Results  Component Value Date   HGBA1C 4.9 05/11/2012   No results found for this basename: VITAMINB12   No results found for this basename: TSH   Ct Head Wo Contrast 05/10/2012 Chronic small vessel ischemia. Age indeterminate area of involvement  in the left corona radiata (image 16). Otherwise no evidence of acute intracranial abnormality.  Mr Angiogram Head Wo Contrast 05/10/2012 No significant intracranial stenosis.  Mr Brain Wo Contrast 05/10/2012 No significant intracranial stenosis.  ECHO: Left ventricle: ejection fraction was in the range of 55% to 65%. No source of embolus.  Carotid doppler: No significant extracranial carotid artery stenosis demonstrated. Vertebrals are patent with antegrade flow. Incidental finding: bilateral thyroid cysts.  ASSESSMENT AND PLAN  72 year old AA male with left centrum semioval infarct in April 2014.  Infarct felt to be thrombotic due to small vessel disease.  Patient with resultant mild right hemiparesis. ECHO negative for thrombus, doppler no significant ICA stenosis. MRA  no stenosis. History of seizures, on Keppra.   LDL 93. HB-A1c 4.9.  Continue aspirin 81 mg orally every day for secondary stroke prevention and maintain strict control of hypertension with blood pressure goal below 140/90, diabetes with hemoglobin A1c goal below 6.5% and lipids with LDL cholesterol goal below 100 mg/dL.  Praised patient for refraining from smoking. Followup in the future with me in 6 months.  LYNN LAM NP-C 10/06/2012, 2:26 PM  [facility] [address] [place], [place] [address] [phone]

## 2012-10-06 NOTE — Patient Instructions (Addendum)
Continue aspirin 81 mg orally every day  for secondary stroke prevention and maintain strict control of hypertension with blood pressure goal below 130/90, diabetes with hemoglobin A1c goal below 6.5% and lipids with LDL cholesterol goal below 100 mg/dL.   Followup in the future in 6 months.  STROKE/TIA INSTRUCTIONS SMOKING Cigarette smoking nearly doubles your risk of having a stroke & is the single most alterable risk factor  If you smoke or have smoked in the last 12 months, you are advised to quit smoking for your health.  Most of the excess cardiovascular risk related to smoking disappears within a year of stopping.  Ask you doctor about anti-smoking medications  [place] Quit Line: 1-800-QUIT NOW  Free Smoking Cessation Classes [phone]  CHOLESTEROL Know your levels; limit fat & cholesterol in your diet  Lab Results  Component Value Date   CHOL 143 05/11/2012   HDL 41 05/11/2012   LDLCALC 93 05/11/2012   TRIG 47 05/11/2012   CHOLHDL 3.5 05/11/2012      Many patients benefit from treatment even if their cholesterol is at goal.  Goal: Total Cholesterol less than 160  Goal:  LDL less than 100  Goal:  HDL greater than 40  Goal:  Triglycerides less than 150  BLOOD PRESSURE American Stroke Association blood pressure target is less that 120/80 mm/Hg  Your discharge blood pressure is:  BP: 145/85 mmHg  Monitor your blood pressure  Limit your salt and alcohol intake  Many individuals will require more than one medication for high blood pressure  DIABETES (A1c is a blood sugar average for last 3 months) Goal A1c is under 7% (A1c is blood sugar average for last 3 months)  Diabetes: No known diagnosis of diabetes    Lab Results  Component Value Date   HGBA1C 4.9 05/11/2012    Your A1c can be lowered with medications, healthy diet, and exercise.  Check your blood sugar as directed by your physician  Call your physician if you experience unexplained or low blood sugars.   PHYSICAL ACTIVITY/REHABILITATION Goal is 30 minutes at least 4 days per week    Activity decreases your risk of heart attack and stroke and makes your heart stronger.  It helps control your weight and blood pressure; helps you relax and can improve your mood.  Participate in a regular exercise program.  Talk with your doctor about the best form of exercise for you (dancing, walking, swimming, cycling).  DIET/WEIGHT Goal is to maintain a healthy weight  Your height is:  Height: 5\' 3"  (160 cm) Your current weight is: Weight: 118 lb (53.524 kg) Your body Mass Index (BMI) is:  BMI (Calculated): 20.9  Following the type of diet specifically designed for you will help prevent another stroke.  Your goal Body Mass Index (BMI) is 19-24.  Healthy food habits can help reduce 3 risk factors for stroke:  High cholesterol, hypertension, and excess weight.

## 2013-04-05 ENCOUNTER — Ambulatory Visit (INDEPENDENT_AMBULATORY_CARE_PROVIDER_SITE_OTHER): Payer: Medicare Other | Admitting: Nurse Practitioner

## 2013-04-05 ENCOUNTER — Encounter: Payer: Self-pay | Admitting: Nurse Practitioner

## 2013-04-05 VITALS — BP 126/80 | HR 72 | Ht 62.0 in | Wt 117.0 lb

## 2013-04-05 DIAGNOSIS — G40909 Epilepsy, unspecified, not intractable, without status epilepticus: Secondary | ICD-10-CM

## 2013-04-05 NOTE — Patient Instructions (Signed)
Continue aspirin 81 mg orally every day for secondary stroke prevention and maintain strict control of hypertension with blood pressure goal below 140/90, diabetes with hemoglobin A1c goal below 6.5% and lipids with LDL cholesterol goal below 100 mg/dL.   Continue Keppra for seizure prevention.  Followup in the future in 6 months.

## 2013-04-05 NOTE — Progress Notes (Signed)
PATIENT: Kyle Dudley DOB: 04-04-1940  REASON FOR VISIT: follow up for stroke HISTORY FROM: patient  HISTORY OF PRESENT ILLNESS: Kyle Dudley is an 73 y.o. male, right handed with a past medical history significant for an isolated seizure four years ago for which he takes keppra 2,000 mg daily, brought to [facility] by his family due to right sided weakness and dragging ofthe right leg. Kyle Dudley stated that he never had similar symptoms before but this past Monday 05/09/2012 he woke up and noticed that he was not able to grab with his right hand and the right leg was weak. He did not experienced face weakness, headache, vertigo, double vision, difficulty swallowing, slurred speech, language or vision impairment. Stated that he has been dragging the right leg ever since. CT brain revealed age indeterminate hypodensity in the left corona radiata which was confirmed to be an acute lacunar infarct right CR on MRI-DWI. MRA brain unimpressive.  Patient denies medication side effects, with no signs of bleeding. Patient has completed outpatient therapy. He uses a cane to walk long distances. He states his blood pressure is running well at home though it is elevated at 161/87 in the office today. He does have upcoming visit with his primary physician this Friday. He has successfully quit smoking since his stroke. He is wanting to drive. He has history of a seizure 4 years ago and has been on Keppra and simply tolerating it well without side effects.   UPDATE 10/06/12 (LL): Reports that he quit smoking on May 08, 2012. Has not smoked since. BP has been well, 145/85 in office today. Patient reports he still has mild weakness in right leg and decreased fine motor skills in right fingers, but other than that he is doing very well. Has regular follow up visits at PCP, labwork done recently there, reports cholesterol was good.   UPDATE 04/05/13 (LL): Kyle Dudley returns for stroke followup, he states that he has been doing well.   His BP is well controlled, it is 126/80 in office today.  He has not had any seizures since last here.  Patient reports he still has mild weakness in right leg but other than that he is doing very well. He continues to take a daily aspirin and has not had any significant bruising. He has no complaints today.  REVIEW OF SYSTEMS: Full 14 system review of systems performed and notable only for: all negative.  ALLERGIES: No Known Allergies  HOME MEDICATIONS: Outpatient Prescriptions Prior to Visit  Medication Sig Dispense Refill  . amLODipine (NORVASC) 2.5 MG tablet Take 5 mg by mouth daily.      Marland Kitchen aspirin 81 MG chewable tablet Chew 1 tablet (81 mg total) by mouth daily.      Marland Kitchen levETIRAcetam (KEPPRA XR) 500 MG 24 hr tablet Take 2,000 mg by mouth at bedtime.       Marland Kitchen lisinopril (PRINIVIL,ZESTRIL) 5 MG tablet    Sig: Take 5 mg by mouth daily.   PHYSICAL EXAM  Filed Vitals:   04/05/13 1303  BP: 126/80  Pulse: 72  Height: 5\' 2"  (1.575 m)  Weight: 117 lb (53.071 kg)   Body mass index is 21.39 kg/(m^2).  GeGENERAL EXAM: Patient is in no distress, well developed and well groomed.  HEAD: Symmetric facial features.  EARS, NOSE, and THROAT: Normal.  NECK: Supple, no JVD  RESPIRATORY: Lungs CTA.  CARDIOVASCULAR: Regular rate and rhythm, no murmurs, no carotid bruits  SKIN: No rash, no bruising  NEUROLOGIC:  MENTAL STATUS: awake, alert and oriented to person, place and time, language fluent, comprehension intact, naming intact  CRANIAL NERVE: pupils equal and reactive to light, visual fields full to confrontation, extraocular muscles intact, no nystagmus, facial sensation and strength symmetric, uvula midline, shoulder shrug symmetric, tongue midline.  MOTOR: normal bulk and tone, full strength in the BUE, BLE, decreased fine finger movements on Right. Mild dragging of right leg. Increased tone right leg.  SENSORY: normal and symmetric to light touch, pinprick  COORDINATION:  finger-nose-finger normal  REFLEXES: deep tendon reflexes present and symmetric 2+,  GAIT/STATION: narrow based gait; unable to walk on toes, heels and tandem safely. romberg is negative. Using 4-prong cane for balance when outside of house.  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 Infarct felt to be thrombotic due to small vessel disease. Patient with resultant mild right hemiparesis. ECHO negative for thrombus, doppler no significant ICA stenosis. MRA no stenosis. History of seizures, on Keppra.   Continue aspirin 81 mg orally every day for secondary stroke prevention and maintain strict control of hypertension with blood pressure goal below 140/90, diabetes with hemoglobin A1c goal below 6.5% and lipids with LDL cholesterol goal below 100 mg/dL.  Continue Keppra at current dose. Followup in the future in 6 months.  Ronal FearLYNN E. Ellanora Rayborn, MSN, NP-C 04/05/2013, 1:13 PM [facility] [address], [address] [place], [place] [id number] [phone]  Note: This document was prepared with digital dictation and possible smart phrase technology. Any transcriptional errors that result from this process are unintentional.

## 2013-07-04 ENCOUNTER — Encounter: Payer: Self-pay | Admitting: Neurology

## 2013-10-05 ENCOUNTER — Ambulatory Visit: Payer: Medicare Other | Admitting: Neurology

## 2013-10-11 ENCOUNTER — Encounter: Payer: Self-pay | Admitting: Neurology

## 2013-10-11 ENCOUNTER — Encounter (INDEPENDENT_AMBULATORY_CARE_PROVIDER_SITE_OTHER): Payer: Self-pay

## 2013-10-11 ENCOUNTER — Ambulatory Visit (INDEPENDENT_AMBULATORY_CARE_PROVIDER_SITE_OTHER): Payer: Medicare Other | Admitting: Neurology

## 2013-10-11 VITALS — BP 142/81 | HR 64 | Wt 111.2 lb

## 2013-10-11 DIAGNOSIS — I6529 Occlusion and stenosis of unspecified carotid artery: Secondary | ICD-10-CM

## 2013-10-11 NOTE — Patient Instructions (Signed)
Continue aspirin for stroke prevention with strict control of hypertension with blood pressure goal below 130/90. Continue Keppra for seizure prophylaxis. Check followup carotid ultrasound study. Follow up with her primary physician Dr. Nadyne Coombes for weight loss. Return for followup in one year or earlier if necessary

## 2013-10-11 NOTE — Progress Notes (Signed)
[facility]  PATIENT: Kyle Dudley DOB: May 04, 1940   HISTORY FROM: Patient and hospital chart REASON FOR VISIT: Stroke f/u  HISTORY OF PRESENT ILLNESS:  Balthazar Dooly is an 73 y.o. male, right handed with a past medical history significant for an isolated seizure four years ago for which he takes keppra 2,000 mg daily, brought to [facility] by his family due to right sided weakness and dragging ofthe right leg. Mr. Bertoni stated that he never had similar symptoms before but this past Monday 05/09/2012 he woke up and noticed that he was not able to grab with his right hand and the right leg was weak. He did not experienced face weakness, headache, vertigo, double vision, difficulty swallowing, slurred speech, language or vision impairment. Stated that he has been dragging the right leg ever since. CT brain revealed age indeterminate hypodensity in the left corona radiata which was confirmed to be an acute lacunar infarct right CR on MRI-DWI. MRA brain unimpressive. Patient denies medication side effects, with no signs of bleeding.  Patient has completed outpatient therapy. He uses a cane to walk long distances. He states his blood pressure is running well at home though it is elevated at 161/87 in the office today. He does have upcoming visit with his primary physician this Friday. He has successfully quit smoking since his stroke. He is wanting to drive. He has history of a seizure 4 years ago and has been on Keppra and simply tolerating it well without side effects. UPDATE 10/11/2013 : Patient returns for followup of her last visit a year ago. He seems to be been fine without recurrence stroke or TIA symptoms. She has not also had any seizures. He remains on Keppra for seizures as well as aspirin for stroke prevention. He states his blood pressure is well controlled though it is slightly elevated at 142/81 office today. He has been losing weight recently and states that his heat is enough. He plans  to discuss this with his primary physician. He continues to use a cane for long distances but can walk indoors without it quite well. He has no other complaints today. REVIEW OF SYSTEMS: Full 14 system review of systems performed and notable only for: Weight loss, change in appetite.   ALLERGIES: No Known Allergies  HOME MEDICATIONS: Outpatient Prescriptions Prior to Visit  Medication Sig Dispense Refill  . aspirin 81 MG chewable tablet Chew 1 tablet (81 mg total) by mouth daily.      Marland Kitchen levETIRAcetam (KEPPRA XR) 500 MG 24 hr tablet Take 2,000 mg by mouth at bedtime.      Marland Kitchen lisinopril (PRINIVIL,ZESTRIL) 5 MG tablet Take 5 mg by mouth daily.      Marland Kitchen amLODipine (NORVASC) 2.5 MG tablet Take 5 mg by mouth daily.       No facility-administered medications prior to visit.    PAST MEDICAL HISTORY: Past Medical History  Diagnosis Date  . Seizures   . Stroke     PAST SURGICAL HISTORY: No past surgical history on file.  FAMILY HISTORY: Family History  Problem Relation Age of Onset  . Stroke Sister     2000  . Stroke Mother     SOCIAL HISTORY: History   Social History  . Marital Status: Single    Spouse Name: N/A    Number of Children: 2  . Years of Education: 9th   Occupational History  . Retired    Social History Main Topics  . Smoking status: Former Smoker --  0.50 packs/day for 55 years    Types: Cigarettes    Quit date: 05/08/2012  . Smokeless tobacco: Never Used  . Alcohol Use: No  . Drug Use: No  . Sexual Activity: Not on file   Other Topics Concern  . Not on file   Social History Narrative   Patient is single and  lives at home alone.   Patient has two children.   Patient is retired.   Patient has a 9th grade education.   Caffeine Use: 5 cups of tea daily   Patient is right-handed.     PHYSICAL EXAM  Filed Vitals:   10/11/13 1457  BP: 142/81  Pulse: 64  Weight: 111 lb 3.2 oz (50.44 kg)   Body mass index is 20.33 kg/(m^2).  GENERAL EXAM: Frail  elderly African American male in no distress, well developed and well groomed. HEAD: Symmetric facial features. EARS, NOSE, and THROAT: Normal.  NECK: Supple, no JVD RESPIRATORY: Lungs CTA. CARDIOVASCULAR: Regular rate and rhythm, no murmurs, no carotid bruits SKIN: No rash, no bruising Filed Vitals:   10/11/13 1457  BP: 142/81  Pulse: 64    NEUROLOGIC: MENTAL STATUS: awake, alert and oriented to person, place and time, language fluent, comprehension intact, naming intact CRANIAL NERVE: no papilledema on fundoscopic exam, pupils equal and reactive to light, visual fields full to confrontation, extraocular muscles intact, no nystagmus, facial sensation and strength symmetric, uvula midline, shoulder shrug symmetric, tongue midline. MOTOR: normal bulk and tone, full strength in the BUE, BLE, decreased  fine finger movements on  Right.drags right leg. Increased tone right leg. SENSORY: normal and symmetric to light touch, pinprick, temperature, vibration COORDINATION: finger-nose-finger REFLEXES: deep tendon reflexes present and symmetric 2+,  GAIT/STATION: narrow based gait; able to walk on toes, heels and tandem; romberg is negative. No assistive device.   DIAGNOSTIC DATA (LABS, IMAGING, TESTING) - I reviewed patient records, labs, notes, testing and imaging myself where available.  Lab Results  Component Value Date   WBC 6.5 05/16/2012   HGB 15.0 05/16/2012   HCT 43.4 05/16/2012   MCV 91.2 05/16/2012   PLT 229 05/16/2012      Component Value Date/Time   NA 136 05/16/2012 0723   K 4.0 05/16/2012 0723   CL 100 05/16/2012 0723   CO2 27 05/16/2012 0723   GLUCOSE 91 05/16/2012 0723   BUN 18 05/16/2012 0723   CREATININE 0.80 05/20/2012 0558   CALCIUM 9.4 05/16/2012 0723   PROT 6.6 05/16/2012 0723   ALBUMIN 3.5 05/16/2012 0723   AST 33 05/16/2012 0723   ALT 25 05/16/2012 0723   ALKPHOS 78 05/16/2012 0723   BILITOT 0.6 05/16/2012 0723   GFRNONAA 88* 05/20/2012 0558   GFRAA >90 05/20/2012 0558    Lab Results  Component Value Date   CHOL 143 05/11/2012   HDL 41 05/11/2012   LDLCALC 93 05/11/2012   TRIG 47 05/11/2012   CHOLHDL 3.5 05/11/2012   Lab Results  Component Value Date   HGBA1C 4.9 05/11/2012   No results found for this basename: VITAMINB12   No results found for this basename: TSH   Ct Head Wo Contrast 05/10/2012    Chronic small vessel ischemia. Age indeterminate area of involvement in the left corona radiata (image 16). Otherwise no evidence of acute intracranial abnormality.  Mr Angiogram Head Wo Contrast 05/10/2012  No significant intracranial stenosis.   Mr Brain Wo Contrast 05/10/2012  No significant intracranial stenosis.   ECHO: Left ventricle: ejection fraction was  in the range of 55% to 65%. No source of embolus.  Carotid doppler: No significant extracranial carotid artery stenosis demonstrated. Vertebrals are patent with antegrade flow.  Incidental finding: bilateral thyroid cysts.   ASSESSMENT AND PLAN  73 year old AA male with left centrum semioval infarct.  Infarct felt to be thrombotic due to small vessel disease. In April 2014.   History of seizures, on Keppra.  LDL 93. HB-A1c 4.9 .    Continue aspirin for stroke prevention with strict control of hypertension with blood pressure goal below 130/90. Continue Keppra for seizure prophylaxis. Check followup carotid ultrasound study. Follow up with her primary physician Dr. Nadyne Coombes for weight loss. Return for followup in one year or earlier if necessary  Delia Heady, MD  10/11/2013, 4:20 PM  [facility] [address] [place], [place] [address] [phone]

## 2013-10-19 ENCOUNTER — Ambulatory Visit (INDEPENDENT_AMBULATORY_CARE_PROVIDER_SITE_OTHER): Payer: Medicare Other

## 2013-10-19 DIAGNOSIS — I6529 Occlusion and stenosis of unspecified carotid artery: Secondary | ICD-10-CM

## 2013-12-08 ENCOUNTER — Telehealth: Payer: Self-pay | Admitting: *Deleted

## 2013-12-08 NOTE — Telephone Encounter (Signed)
I called and notified the patient that I had accidentally mailed out a copy of his results to another patient due to it being stapled together by a previous employee.  I apologized for my mistake and he was very understanding.  I notified the patient of his US Carotid Duplex Results per N. Carolyn Martin, MSN-GNP, BC and told him that I would mail him the results immediately.  

## 2014-10-11 ENCOUNTER — Encounter: Payer: Self-pay | Admitting: Neurology

## 2014-10-11 ENCOUNTER — Ambulatory Visit (INDEPENDENT_AMBULATORY_CARE_PROVIDER_SITE_OTHER): Payer: Medicare Other | Admitting: Neurology

## 2014-10-11 VITALS — BP 118/71 | HR 67 | Ht 63.0 in | Wt 107.8 lb

## 2014-10-11 DIAGNOSIS — I6990 Unspecified sequelae of unspecified cerebrovascular disease: Secondary | ICD-10-CM | POA: Diagnosis not present

## 2014-10-11 MED ORDER — LEVETIRACETAM ER 500 MG PO TB24: 1500.0000 mg | ORAL_TABLET | Freq: Every day | ORAL | Status: DC

## 2014-10-11 NOTE — Progress Notes (Signed)
[facility]  PATIENT: Kyle Dudley DOB: [birth year]   HISTORY FROM: Patient and hospital chart REASON FOR VISIT: Stroke f/u  HISTORY OF PRESENT ILLNESS:  Kyle Dudley is an 74 y.o. male, right handed with a past medical history significant for an isolated seizure four years ago for which he takes keppra 2,000 mg daily, brought to [facility] by his family due to right sided weakness and dragging ofthe right leg. Kyle Dudley stated that he never had similar symptoms before but this past Monday 05/09/2012 he woke up and noticed that he was not able to grab with his right hand and the right leg was weak. He did not experienced face weakness, headache, vertigo, double vision, difficulty swallowing, slurred speech, language or vision impairment. Stated that he has been dragging the right leg ever since. CT brain revealed age indeterminate hypodensity in the left corona radiata which was confirmed to be an acute lacunar infarct right CR on MRI-DWI. MRA brain unimpressive. Patient denies medication side effects, with no signs of bleeding.  Patient has completed outpatient therapy. He uses a cane to walk long distances. He states his blood pressure is running well at home though it is elevated at 161/87 in the office today. He does have upcoming visit with his primary physician this Friday. He has successfully quit smoking since his stroke. He is wanting to drive. He has history of a seizure 4 years ago and has been on Keppra and simply tolerating it well without side effects. UPDATE 10/11/2013 : Patient returns for followup of her last visit a year ago. He seems to be been fine without recurrence stroke or TIA symptoms. She has not also had any seizures. He remains on Keppra for seizures as well as aspirin for stroke prevention. He states his blood pressure is well controlled though it is slightly elevated at 142/81 office today. He has been losing weight recently and states that his heat is enough. He plans  to discuss this with his primary physician. He continues to use a cane for long distances but can walk indoors without it quite well. He has no other complaints today. Update 10/11/2014 ; she returns for follow-up after last visit 1 year ago. He continues to do well without recurrent stroke or TIA symptoms. He has not had any seizures either now for several years. He is tolerating aspirin well without significant bleeding or bruising. He states his blood pressure is under good control and today it is 118/71. His tolerating lisinopril well without side effects. He remains on Keppra XR 2 g at night and is tolerating it well. He has no complaints today. REVIEW OF SYSTEMS: Full 14 system review of systems performed and notable only for: No complaints today ALLERGIES: No Known Allergies  HOME MEDICATIONS: Outpatient Prescriptions Prior to Visit  Medication Sig Dispense Refill  . aspirin 81 MG chewable tablet Chew 1 tablet (81 mg total) by mouth daily.    Marland Kitchen lisinopril (PRINIVIL,ZESTRIL) 5 MG tablet Take 5 mg by mouth daily.    Marland Kitchen levETIRAcetam (KEPPRA XR) 500 MG 24 hr tablet Take 2,000 mg by mouth at bedtime.     No facility-administered medications prior to visit.    PAST MEDICAL HISTORY: Past Medical History  Diagnosis Date  . Seizures   . Stroke     PAST SURGICAL HISTORY: History reviewed. No pertinent past surgical history.  FAMILY HISTORY: Family History  Problem Relation Age of Onset  . Stroke Sister     2000  .  Stroke Mother     SOCIAL HISTORY: Social History   Social History  . Marital Status: Single    Spouse Name: N/A  . Number of Children: 2  . Years of Education: 9th   Occupational History  . Retired    Social History Main Topics  . Smoking status: Former Smoker -- 0.50 packs/day for 55 years    Types: Cigarettes    Quit date: 05/08/2012  . Smokeless tobacco: Never Used  . Alcohol Use: No  . Drug Use: No  . Sexual Activity: Not on file   Other Topics  Concern  . Not on file   Social History Narrative   Patient is single and  lives at home alone.   Patient has two children.   Patient is retired.   Patient has a 9th grade education.   Caffeine Use: 5 cups of tea daily   Patient is right-handed.     PHYSICAL EXAM  Filed Vitals:   10/11/14 1318  BP: 118/71  Pulse: 67  Height: 5\' 3"  (1.6 m)  Weight: 107 lb 12.8 oz (48.898 kg)   Body mass index is 19.1 kg/(m^2).  GENERAL EXAM: Frail elderly African American male in no distress, well developed and well groomed. HEAD: Symmetric facial features. EARS, NOSE, and THROAT: Normal.  NECK: Supple, no JVD RESPIRATORY: Lungs CTA. CARDIOVASCULAR: Regular rate and rhythm, no murmurs, no carotid bruits SKIN: No rash, no bruising Filed Vitals:   10/11/14 1318  BP: 118/71  Pulse: 67    NEUROLOGIC: MENTAL STATUS: awake, alert and oriented to person, place and time, language fluent, comprehension intact, naming intact CRANIAL NERVE:   fundoscopic exam not done., pupils equal and reactive to light, visual fields full to confrontation, extraocular muscles intact, no nystagmus, facial sensation and strength symmetric, uvula midline, shoulder shrug symmetric, tongue midline. MOTOR: normal bulk and tone, full strength in the BUE, BLE, decreased  fine finger movements on  Right.drags right leg. Increased tone right leg. SENSORY: normal and symmetric to light touch, pinprick, temperature, vibration COORDINATION: finger-nose-finger REFLEXES: deep tendon reflexes present and symmetric 2+,  GAIT/STATION: narrow based gait; able to walk on toes, heels and tandem; romberg is negative. No assistive device.   DIAGNOSTIC DATA (LABS, IMAGING, TESTING) - I reviewed patient records, labs, notes, testing and imaging myself where available.  Lab Results  Component Value Date   WBC 6.5 05/16/2012   HGB 15.0 05/16/2012   HCT 43.4 05/16/2012   MCV 91.2 05/16/2012   PLT 229 05/16/2012      Component  Value Date/Time   NA 136 05/16/2012 0723   K 4.0 05/16/2012 0723   CL 100 05/16/2012 0723   CO2 27 05/16/2012 0723   GLUCOSE 91 05/16/2012 0723   BUN 18 05/16/2012 0723   CREATININE 0.80 05/20/2012 0558   CALCIUM 9.4 05/16/2012 0723   PROT 6.6 05/16/2012 0723   ALBUMIN 3.5 05/16/2012 0723   AST 33 05/16/2012 0723   ALT 25 05/16/2012 0723   ALKPHOS 78 05/16/2012 0723   BILITOT 0.6 05/16/2012 0723   GFRNONAA 88* 05/20/2012 0558   GFRAA >90 05/20/2012 0558   Lab Results  Component Value Date   CHOL 143 05/11/2012   HDL 41 05/11/2012   LDLCALC 93 05/11/2012   TRIG 47 05/11/2012   CHOLHDL 3.5 05/11/2012   Lab Results  Component Value Date   HGBA1C 4.9 05/11/2012   No results found for: VITAMINB12 No results found for: TSH Ct Head Wo Contrast 05/10/2012  Chronic small vessel ischemia. Age indeterminate area of involvement in the left corona radiata (image 16). Otherwise no evidence of acute intracranial abnormality.  Mr Angiogram Head Wo Contrast 05/10/2012  No significant intracranial stenosis.   Mr Brain Wo Contrast 05/10/2012  No significant intracranial stenosis.   ECHO: Left ventricle: ejection fraction was in the range of 55% to 65%. No source of embolus.  Carotid doppler: No significant extracranial carotid artery stenosis demonstrated. Vertebrals are patent with antegrade flow.  Incidental finding: bilateral thyroid cysts.   ASSESSMENT AND PLAN  74 year old AA male with left centrum semioval infarct.  Infarct felt to be thrombotic due to small vessel disease. In April 2014.   History of seizures, on Keppra.  I had a long discussion with the patient with regards to his remote stroke, discussed risk for stroke  And seizure recurrence, risk factor modification and answered questions. I recommend he stay on aspirin for stroke prevention and maintain strict control of hypertension with blood pressure goal below 130/90. Since the patient has been seizure-free for several  years will try to reduce the dose of Keppra to 1500 mg daily. I have advised him to call me if he has any breakthrough seizures. He was advised to return for follow-up in one year or call earlier if necessary Delia Heady, MD  10/11/2014, 1:59 PM  [facility] [address] [place], [place] [address] [phone]

## 2014-10-11 NOTE — Patient Instructions (Signed)
I had a long discussion with the patient with regards to his remote stroke, discussed risk for stroke  And seizure recurrence, risk factor modification and answered questions. I recommend he stay on aspirin for stroke prevention and maintain strict control of hypertension with blood pressure goal below 130/90. Since the patient has been seizure-free for several years will try to reduce the dose of Keppra to 1500 mg daily. I have advised him to call me if he has any breakthrough seizures. He was advised to return for follow-up in one year or call earlier if necessary

## 2015-04-22 ENCOUNTER — Telehealth: Payer: Self-pay | Admitting: Neurology

## 2015-04-22 NOTE — Telephone Encounter (Signed)
Tracy/social worker called for pt sts he is out of levETIRAcetam (KEPPRA XR) 500 MG 24 hr tablet . Pt took last dose yesterday. He was confused thinking it was PCP that was prescribing. Please call to [facility] ASAP and call pt to let him know it has been called in.

## 2015-04-22 NOTE — Telephone Encounter (Signed)
Rn call [facility] and they stated pt has refill and it was done. The pharmacy will call the patient.

## 2015-05-06 ENCOUNTER — Other Ambulatory Visit: Payer: Self-pay

## 2015-05-06 MED ORDER — LEVETIRACETAM ER 500 MG PO TB24: 1500.0000 mg | ORAL_TABLET | Freq: Every day | ORAL | Status: DC

## 2015-05-06 NOTE — Telephone Encounter (Signed)
Rn call Kyle Dudley patients social worker at [phone] and her fax is [phone].Kyle Dudley stated the patient had no refills for his keppra. Rn explain a call was made to the pharmacy and he just had the keppra refill. Kyle Dudley stated the patients PCP call in a 30 day supply for keppra. Kyle Dudley(sw) thinks the pharmacy got confuse and thought [facility] was the PCP calling about he medication. Rn did another refill for patient until he comes in September 2017. Rn fax Kyle Dudley social worker a copy of the keppra order from 09/2014 to show he was given 6 refills. Kyle Dudley receive the fax and verbalized understanding. Rn stated the medication for keppra is at the pharmacy and they should call for pick up.

## 2015-05-06 NOTE — Telephone Encounter (Signed)
Tracy/Social Worker called back and was [facility] by [facility] they don't have anything on file for levETIRAcetam (KEPPRA XR) 500 MG 24 hr tablet.

## 2015-05-23 ENCOUNTER — Telehealth: Payer: Self-pay | Admitting: Neurology

## 2015-05-23 NOTE — Telephone Encounter (Signed)
RN call patient to notified him that he has 7 refills as of April 2017. Pt verbalized understanding. Pt will call [facility].

## 2015-05-23 NOTE — Telephone Encounter (Signed)
Patient requesting refill of levetiracetam ER  500 mg (take 3 tablets daily) Best number to call is [phone]

## 2015-10-10 ENCOUNTER — Ambulatory Visit: Payer: Medicare Other | Admitting: Neurology

## 2015-10-14 ENCOUNTER — Encounter: Payer: Self-pay | Admitting: Neurology

## 2015-11-07 ENCOUNTER — Ambulatory Visit (INDEPENDENT_AMBULATORY_CARE_PROVIDER_SITE_OTHER): Payer: Medicare Other | Admitting: Neurology

## 2015-11-07 ENCOUNTER — Encounter: Payer: Self-pay | Admitting: Neurology

## 2015-11-07 VITALS — BP 154/78 | HR 66 | Wt 123.8 lb

## 2015-11-07 DIAGNOSIS — I6990 Unspecified sequelae of unspecified cerebrovascular disease: Secondary | ICD-10-CM

## 2015-11-07 MED ORDER — LEVETIRACETAM ER 500 MG PO TB24: 1000.0000 mg | ORAL_TABLET | Freq: Every day | ORAL | 7 refills | Status: DC

## 2015-11-07 NOTE — Patient Instructions (Addendum)
I had a long d/w patient about his remote seizures and lacunar stroke, risk for recurrent stroke/TIAs, personally independently reviewed imaging studies and stroke evaluation results and answered questions.Continue aspirin 81 mg daily  for secondary stroke prevention and maintain strict control of hypertension with blood pressure goal below 130/90, diabetes with hemoglobin A1c goal below 6.5% and lipids with LDL cholesterol goal below 70 mg/dL. I also advised the patient to eat a healthy diet with plenty of whole grains, cereals, fruits and vegetables, exercise regularly and maintain ideal body weight . I recommend he reduce the dose of Keppra XR  to 1000 mg daily as he has been seizure-free for several years. He did check screening follow-up carotid ultrasound study. Followup in the future with me in one year only or call earlier if necessary

## 2015-11-07 NOTE — Progress Notes (Signed)
[facility]  PATIENT: Kyle Dudley DOB: 09-Jul-1940   HISTORY FROM: Patient and hospital chart REASON FOR VISIT: Stroke f/u  HISTORY OF PRESENT ILLNESS:  Early Steel is an 75 y.o. male, right handed with a past medical history significant for an isolated seizure four years ago for which he takes keppra 2,000 mg daily, brought to [facility] by his family due to right sided weakness and dragging ofthe right leg. Mr. Appleyard stated that he never had similar symptoms before but this past Monday 05/09/2012 he woke up and noticed that he was not able to grab with his right hand and the right leg was weak. He did not experienced face weakness, headache, vertigo, double vision, difficulty swallowing, slurred speech, language or vision impairment. Stated that he has been dragging the right leg ever since. CT brain revealed age indeterminate hypodensity in the left corona radiata which was confirmed to be an acute lacunar infarct right CR on MRI-DWI. MRA brain unimpressive. Patient denies medication side effects, with no signs of bleeding.  Patient has completed outpatient therapy. He uses a cane to walk long distances. He states his blood pressure is running well at home though it is elevated at 161/87 in the office today. He does have upcoming visit with his primary physician this Friday. He has successfully quit smoking since his stroke. He is wanting to drive. He has history of a seizure 4 years ago and has been on Keppra and simply tolerating it well without side effects. UPDATE 10/11/2013 : Patient returns for followup of her last visit a year ago. He seems to be been fine without recurrence stroke or TIA symptoms. She has not also had any seizures. He remains on Keppra for seizures as well as aspirin for stroke prevention. He states his blood pressure is well controlled though it is slightly elevated at 142/81 office today. He has been losing weight recently and states that his heat is enough. He plans  to discuss this with his primary physician. He continues to use a cane for long distances but can walk indoors without it quite well. He has no other complaints today. Update 10/11/2014 ; she returns for follow-up after last visit 1 year ago. He continues to do well without recurrent stroke or TIA symptoms. He has not had any seizures either now for several years. He is tolerating aspirin well without significant bleeding or bruising. He states his blood pressure is under good control and today it is 118/71. His tolerating lisinopril well without side effects. He remains on Keppra XR 2 g at night and is tolerating it well. He has no complaints today. Update 11/07/2015 : He returns for follow-up after last visit a year ago. He continues to do well without recurrent TIA or stroke symptoms. He remains on aspirin 81 mg which is tolerating well without bruising or bleeding. He states his blood pressure is well controlled but does not check them regularly poor. Today upon arrival when the nurse checked it it was 159/78 but when I checked it personally 146/90. Patient has not had any recurrent seizures. At last visit the dose of Keppra XR was reduced to 1500 mg daily which is tolerating well without side effects. He has no complaints today. He has not had follow-up carotid ultrasound done in more than a year and a half REVIEW OF SYSTEMS: Full 14 system review of systems performed and notable only for: No complaints yet again today ALLERGIES: No Known Allergies  HOME MEDICATIONS: Outpatient Medications  Prior to Visit  Medication Sig Dispense Refill  . aspirin 81 MG chewable tablet Chew 1 tablet (81 mg total) by mouth daily.    Marland Kitchen. lisinopril (PRINIVIL,ZESTRIL) 5 MG tablet Take 5 mg by mouth daily.    Marland Kitchen. levETIRAcetam (KEPPRA XR) 500 MG 24 hr tablet Take 3 tablets (1,500 mg total) by mouth at bedtime. 90 tablet 7   No facility-administered medications prior to visit.     PAST MEDICAL HISTORY: Past Medical  History:  Diagnosis Date  . Seizures (HCC)   . Stroke [facility](HCC)     PAST SURGICAL HISTORY: History reviewed. No pertinent surgical history.  FAMILY HISTORY: Family History  Problem Relation Age of Onset  . Stroke Mother   . Stroke Sister     2000    SOCIAL HISTORY: Social History   Social History  . Marital status: Single    Spouse name: N/A  . Number of children: 2  . Years of education: 9th   Occupational History  . Retired    Social History Main Topics  . Smoking status: Former Smoker    Packs/day: 0.50    Years: 55.00    Types: Cigarettes    Quit date: 05/08/2012  . Smokeless tobacco: Never Used  . Alcohol use No  . Drug use: No  . Sexual activity: Not on file   Other Topics Concern  . Not on file   Social History Narrative   Patient is single and  lives at home alone.   Patient has two children.   Patient is retired.   Patient has a 9th grade education.   Caffeine Use: 5 cups of tea daily   Patient is right-handed.     PHYSICAL EXAM  Vitals:   11/07/15 1413  BP: (!) 154/78  BP Location: Right Arm  Patient Position: Sitting  Cuff Size: Small  Pulse: 66  Weight: 123 lb 12.8 oz (56.2 kg)   Body mass index is 21.93 kg/m.  GENERAL EXAM: Frail elderly African American male in no distress, well developed and well groomed. HEAD: Symmetric facial features. EARS, NOSE, and THROAT: Normal.  NECK: Supple, no JVD RESPIRATORY: Lungs CTA. CARDIOVASCULAR: Regular rate and rhythm, no murmurs, no carotid bruits SKIN: No rash, no bruising Vitals:   11/07/15 1413  BP: (!) 154/78  Pulse: 66    NEUROLOGIC: MENTAL STATUS: awake, alert and oriented to person, place and time, language fluent, comprehension intact, naming intact CRANIAL NERVE:   fundoscopic exam not done., pupils equal and reactive to light, visual fields full to confrontation, extraocular muscles intact, no nystagmus, facial sensation and strength symmetric, uvula midline, shoulder shrug  symmetric, tongue midline. MOTOR: normal bulk and tone, full strength in the BUE, BLE, decreased  fine finger movements on  Right.drags right leg. Increased tone right leg. SENSORY: normal and symmetric to light touch, pinprick, temperature, vibration COORDINATION: finger-nose-finger REFLEXES: deep tendon reflexes present and symmetric 2+,  GAIT/STATION: narrow based gait; able to walk on toes, heels and tandem; rhomberg is negative. No assistive device.   DIAGNOSTIC DATA (LABS, IMAGING, TESTING) - I reviewed patient records, labs, notes, testing and imaging myself where available.  Lab Results  Component Value Date   WBC 6.5 05/16/2012   HGB 15.0 05/16/2012   HCT 43.4 05/16/2012   MCV 91.2 05/16/2012   PLT 229 05/16/2012      Component Value Date/Time   NA 136 05/16/2012 0723   K 4.0 05/16/2012 0723   CL 100 05/16/2012 0723  CO2 27 05/16/2012 0723   GLUCOSE 91 05/16/2012 0723   BUN 18 05/16/2012 0723   CREATININE 0.80 05/20/2012 0558   CALCIUM 9.4 05/16/2012 0723   PROT 6.6 05/16/2012 0723   ALBUMIN 3.5 05/16/2012 0723   AST 33 05/16/2012 0723   ALT 25 05/16/2012 0723   ALKPHOS 78 05/16/2012 0723   BILITOT 0.6 05/16/2012 0723   GFRNONAA 88 (L) 05/20/2012 0558   GFRAA >90 05/20/2012 0558   Lab Results  Component Value Date   CHOL 143 05/11/2012   HDL 41 05/11/2012   LDLCALC 93 05/11/2012   TRIG 47 05/11/2012   CHOLHDL 3.5 05/11/2012   Lab Results  Component Value Date   HGBA1C 4.9 05/11/2012   No results found for: VITAMINB12 No results found for: TSH Ct Head Wo Contrast 05/10/2012    Chronic small vessel ischemia. Age indeterminate area of involvement in the left corona radiata (image 16). Otherwise no evidence of acute intracranial abnormality.  Mr Angiogram Head Wo Contrast 05/10/2012  No significant intracranial stenosis.   Mr Brain Wo Contrast 05/10/2012  No significant intracranial stenosis.   ECHO: Left ventricle: ejection fraction was in the range of 55%  to 65%. No source of embolus.  Carotid doppler: No significant extracranial carotid artery stenosis demonstrated. Vertebrals are patent with antegrade flow.  Incidental finding: bilateral thyroid cysts.   ASSESSMENT AND PLAN  75 year old AA male with left centrum semioval infarct.  Infarct felt to be thrombotic due to small vessel disease. In April 2014.   History of seizures, on Keppra.  I had a long d/w patient about his remote seizures and lacunar stroke, risk for recurrent stroke/TIAs, personally independently reviewed imaging studies and stroke evaluation results and answered questions.Continue aspirin 81 mg daily  for secondary stroke prevention and maintain strict control of hypertension with blood pressure goal below 130/90, diabetes with hemoglobin A1c goal below 6.5% and lipids with LDL cholesterol goal below 70 mg/dL. I also advised the patient to eat a healthy diet with plenty of whole grains, cereals, fruits and vegetables, exercise regularly and maintain ideal body weight . I recommend he reduce the dose of Keppra XR  to 1000 mg daily as he has been seizure-free for several years. He did check screening follow-up carotid ultrasound study. Followup in the future with me in one year only or call earlier if necessary  Delia Heady, MD  11/07/2015, 3:00 PM  [facility] [address], [address] [place], [place] [address] [phone]

## 2015-11-14 ENCOUNTER — Telehealth: Payer: Self-pay | Admitting: Neurology

## 2015-11-14 NOTE — Telephone Encounter (Signed)
Patient is ready to be scheduled for Doppler. No PA needed. Thanks Shannon .  °

## 2015-11-21 ENCOUNTER — Ambulatory Visit (INDEPENDENT_AMBULATORY_CARE_PROVIDER_SITE_OTHER): Payer: Medicare Other

## 2015-11-21 DIAGNOSIS — I6990 Unspecified sequelae of unspecified cerebrovascular disease: Secondary | ICD-10-CM

## 2015-12-02 ENCOUNTER — Telehealth: Payer: Self-pay

## 2015-12-02 NOTE — Telephone Encounter (Signed)
Rn call patient about his carotid duplex study. Rn stated Dr. Pearlean BrownieSethi reviewed his test and it was negative. Pt verbalized understanding.

## 2016-01-30 ENCOUNTER — Telehealth: Payer: Self-pay | Admitting: Neurology

## 2016-01-30 NOTE — Telephone Encounter (Signed)
Patient would like to update his pharmacy to [facility]  [address]

## 2016-01-30 NOTE — Telephone Encounter (Signed)
Rn change patients pharmacy to [facility] on [address]. The pharmacy comes up as [address]

## 2016-08-18 ENCOUNTER — Encounter (HOSPITAL_COMMUNITY): Payer: Self-pay

## 2016-08-18 ENCOUNTER — Emergency Department (HOSPITAL_COMMUNITY)
Admission: EM | Admit: 2016-08-18 | Discharge: 2016-08-18 | Disposition: A | Discharge status: Home / Self Care | Payer: Medicare Other | Attending: Emergency Medicine | Admitting: Emergency Medicine

## 2016-08-18 ENCOUNTER — Emergency Department (HOSPITAL_COMMUNITY): Payer: Medicare Other

## 2016-08-18 DIAGNOSIS — M25562 Pain in left knee: Secondary | ICD-10-CM | POA: Insufficient documentation

## 2016-08-18 DIAGNOSIS — E876 Hypokalemia: Secondary | ICD-10-CM

## 2016-08-18 DIAGNOSIS — Z87891 Personal history of nicotine dependence: Secondary | ICD-10-CM | POA: Diagnosis not present

## 2016-08-18 DIAGNOSIS — R531 Weakness: Secondary | ICD-10-CM | POA: Diagnosis present

## 2016-08-18 DIAGNOSIS — I10 Essential (primary) hypertension: Secondary | ICD-10-CM | POA: Diagnosis not present

## 2016-08-18 DIAGNOSIS — M25561 Pain in right knee: Secondary | ICD-10-CM

## 2016-08-18 LAB — BASIC METABOLIC PANEL
ANION GAP: 10 (ref 5–15)
BUN: 14 mg/dL (ref 6–20)
CALCIUM: 9.2 mg/dL (ref 8.9–10.3)
CO2: 24 mmol/L (ref 22–32)
Chloride: 98 mmol/L — ABNORMAL LOW (ref 101–111)
Creatinine, Ser: 1.16 mg/dL (ref 0.61–1.24)
GFR calc Af Amer: >60 mL/min (ref >60)
GFR, EST NON AFRICAN AMERICAN: 60 mL/min — AB (ref >60)
GLUCOSE: 128 mg/dL — AB (ref 65–99)
Potassium: 2.6 mmol/L — CL (ref 3.5–5.1)
Sodium: 132 mmol/L — ABNORMAL LOW (ref 135–145)

## 2016-08-18 LAB — CBC WITH DIFFERENTIAL/PLATELET
BASOS ABS: 0 10*3/uL (ref 0.0–0.1)
Basophils Relative: 1 %
EOS PCT: 1 %
Eosinophils Absolute: 0 10*3/uL (ref 0.0–0.7)
HEMATOCRIT: 40.7 % (ref 39.0–52.0)
Hemoglobin: 13.9 g/dL (ref 13.0–17.0)
LYMPHS ABS: 1.4 10*3/uL (ref 0.7–4.0)
LYMPHS PCT: 19 %
MCH: 29.6 pg (ref 26.0–34.0)
MCHC: 34.2 g/dL (ref 30.0–36.0)
MCV: 86.8 fL (ref 78.0–100.0)
MONO ABS: 0.4 10*3/uL (ref 0.1–1.0)
MONOS PCT: 5 %
NEUTROS ABS: 5.9 10*3/uL (ref 1.7–7.7)
Neutrophils Relative %: 74 %
PLATELETS: 286 10*3/uL (ref 150–400)
RBC: 4.69 MIL/uL (ref 4.22–5.81)
RDW: 12.7 % (ref 11.5–15.5)
WBC: 7.8 10*3/uL (ref 4.0–10.5)

## 2016-08-18 LAB — I-STAT TROPONIN, ED: Troponin i, poc: 0 ng/mL (ref 0.00–0.08)

## 2016-08-18 LAB — MAGNESIUM: MAGNESIUM: 1.9 mg/dL (ref 1.7–2.4)

## 2016-08-18 MED ORDER — POTASSIUM CHLORIDE CRYS ER 20 MEQ PO TBCR
40.0000 meq | EXTENDED_RELEASE_TABLET | Freq: Once | ORAL | Status: AC
  Administered 2016-08-18: 40 meq via ORAL
  Filled 2016-08-18: qty 2

## 2016-08-18 MED ORDER — POTASSIUM CHLORIDE CRYS ER 20 MEQ PO TBCR
20.0000 meq | EXTENDED_RELEASE_TABLET | Freq: Every day | ORAL | 0 refills | Status: DC
Start: 2016-08-18 — End: 2016-09-07

## 2016-08-18 NOTE — ED Notes (Signed)
ED Provider at bedside. 

## 2016-08-18 NOTE — Discharge Instructions (Addendum)
You were evaluated in the ED for weakness and pain in your legs (behind your knees) for several weeks.   Your knee x-rays were normal  Your EKG was normal and most of your lab work was normal. However, you were noted to have low potassium (2.6), this was replaced in the ED. Your magnesium was normal.  Please take daily potassium (as prescribed) for 7 days. Contact your primary care provider within 1 week for re-check of your potassium and magnesium   Return to the emergency department if you develop worsening symptoms including constipation, palpitations, fatigue, muscle pain or spasms, tingling or weakness   Take tylenol for knee pain.

## 2016-08-18 NOTE — ED Provider Notes (Signed)
[facility] Provider Note   CSN: [id number] Arrival date & time: 08/18/16  1307     History   Chief Complaint Chief Complaint  Patient presents with  . Weakness    HPI Kyle Dudley is a 76 y.o. male 76 year old male with history of CVA, isolated seizure, hypertension, tobacco abuse presents to the ED for evaluation of "feeling weak" for several weeks associated with difficulty ambulating. Patient states that he's had posterior bilateral knee pain that is making him weaker and now he is more hesitant to ambulate with his cane. Posterior knee pain worse with flexion. Patient had a home health nurse evaluate him for PT today at home and she told him to come to the ED for evaluation of weakness.  HPI  Past Medical History:  Diagnosis Date  . Seizures (HCC)   . Stroke [facility](HCC)     Patient Active Problem List   Diagnosis Date Noted  . Unspecified essential hypertension 05/13/2012  . Acute ischemic left MCA stroke (HCC) 05/11/2012  . Seizure disorder (HCC) 05/11/2012    History reviewed. No pertinent surgical history.     Home Medications    Prior to Admission medications   Medication Sig Start Date End Date Taking? Authorizing Provider  aspirin 81 MG chewable tablet Chew 1 tablet (81 mg total) by mouth daily. 05/20/12  Yes Love, Evlyn KannerPamela S, PA-C  levETIRAcetam (KEPPRA XR) 500 MG 24 hr tablet Take 2 tablets (1,000 mg total) by mouth daily. 11/07/15  Yes Micki RileySethi, Pramod S, MD  lisinopril (PRINIVIL,ZESTRIL) 5 MG tablet Take 5 mg by mouth daily.   Yes [provider]    Family History Family History  Problem Relation Age of Onset  . Stroke Mother   . Stroke Sister        2000    Social History Social History  Substance Use Topics  . Smoking status: Former Smoker    Packs/day: 0.50    Years: 55.00    Types: Cigarettes    Quit date: 05/08/2012  . Smokeless tobacco: Never Used  . Alcohol use No     Allergies   Patient has no known allergies.   Review of  Systems Review of Systems  Constitutional: Negative for fever.  Respiratory: Negative for cough and shortness of breath.   Cardiovascular: Negative for chest pain.  Gastrointestinal: Negative for abdominal pain, diarrhea and vomiting.  Genitourinary: Negative for difficulty urinating and dysuria.  Musculoskeletal: Positive for arthralgias and myalgias.  Skin: Negative for rash.  Neurological: Positive for weakness. Negative for headaches.     Physical Exam Updated Vital Signs BP 122/74   Pulse 90   Temp 98.6 F (37 C) (Oral)   Resp (!) 23   Ht 5\' 5"  (1.651 m)   Wt 58.1 kg (128 lb)   SpO2 95%   BMI 21.30 kg/m   Physical Exam  Constitutional: He is oriented to person, place, and time. He appears well-developed and well-nourished. No distress.  AxO to self, place and time.  HENT:  Head: Normocephalic and atraumatic.  Nose: Nose normal.  Mouth/Throat: Oropharynx is clear and moist. No oropharyngeal exudate.  Moist mucous membranes  Eyes: Pupils are equal, round, and reactive to light. Conjunctivae and EOM are normal.  No conjunctival pallor  Neck: Normal range of motion. Neck supple.  Cardiovascular: Normal rate, regular rhythm, normal heart sounds and intact distal pulses.   No murmur heard. DP and PT pulses 2+ bilaterally  Pulmonary/Chest: Effort normal and breath sounds normal. No  respiratory distress. He has no wheezes. He has no rales.  Abdominal: Soft. Bowel sounds are normal. He exhibits no distension. There is no tenderness.  No suprapubic or CVA tenderness  Musculoskeletal: Normal range of motion. He exhibits tenderness. He exhibits no deformity.  Decreased knee flexion secondary to pain, bilaterally No focal bony tenderness to bilateral knee Tenderness to bilateral popliteal fossae and proximal calves, without asymmetric edema or vericosities   Lymphadenopathy:    He has no cervical adenopathy.  Neurological: He is alert and oriented to person, place, and time.    A&O to self, place and time. Speech and phonation normal.  Thought process coherent.   Strength 5/5 in upper and lower extremities.   Sensation to light touch intact in upper and lower extremities.  Gait normal/no truncal sway.   Negative Romberg. No leg drift.  Intact finger to nose test. CN I not tested. CN II-CN XII intact bilaterally   Skin: Skin is warm and dry. Capillary refill takes less than 2 seconds.  Psychiatric: He has a normal mood and affect. His behavior is normal. Judgment and thought content normal.  Nursing note and vitals reviewed.    ED Treatments / Results  Labs (all labs ordered are listed, but only abnormal results are displayed) Labs Reviewed  BASIC METABOLIC PANEL - Abnormal; Notable for the following:       Result Value   Sodium 132 (*)    Potassium 2.6 (*)    Chloride 98 (*)    Glucose, Bld 128 (*)    GFR calc non Af Amer 60 (*)    All other components within normal limits  CBC WITH DIFFERENTIAL/PLATELET  MAGNESIUM  I-STAT TROPONIN, ED    EKG  EKG Interpretation  Date/Time:  Tuesday August 18 2016 13:13:01 EDT Ventricular Rate:  96 PR Interval:    QRS Duration: 111 QT Interval:  387 QTC Calculation: 490 R Axis:   38 Text Interpretation:  Sinus rhythm RSR' in V1 or V2, right VCD or RVH Borderline prolonged QT interval no acute changes  Confirmed by Crista Curb [phone]) on 08/18/2016 3:05:47 PM       Radiology Dg Knee Complete 4 Views Left  Result Date: 08/18/2016 CLINICAL DATA:  Posterior knee pain with bending EXAM: LEFT KNEE - COMPLETE 4+ VIEW COMPARISON:  None. FINDINGS: No fracture or dislocation is evident. No large joint effusion. Minimal degenerative change of the medial compartment. Joint spaces are relatively maintained. Vascular calcifications. IMPRESSION: Negative. Electronically Signed   By: Jasmine Pang M.D.   On: 08/18/2016 14:16   Dg Knee Complete 4 Views Right  Result Date: 08/18/2016 CLINICAL DATA:  Bilateral posterior knee  pain with flexion. No known injuries. EXAM: RIGHT KNEE - COMPLETE 4+ VIEW COMPARISON:  None. FINDINGS: No evidence of acute fracture or dislocation. Well-preserved joint spaces. Well-preserved bone mineral density. No intrinsic osseous abnormality. No visible joint effusion. Femoropopliteal and tibioperoneal artery atherosclerosis. IMPRESSION: Normal right knee. Electronically Signed   By: Hulan Saas M.D.   On: 08/18/2016 14:17    Procedures Procedures (including critical care time)  Medications Ordered in ED Medications  potassium chloride SA (K-DUR,KLOR-CON) CR tablet 40 mEq (40 mEq Oral Given 08/18/16 1442)  potassium chloride SA (K-DUR,KLOR-CON) CR tablet 40 mEq (40 mEq Oral Given 08/18/16 1604)     Initial Impression / Assessment and Plan / ED Course  I have reviewed the triage vital signs and the nursing notes.  Pertinent labs & imaging results that were available during  my care of the patient were reviewed by me and considered in my medical decision making (see chart for details).  Clinical Course as of Aug 19 1614  Tue Aug 18, 2016  1427 Potassium: (!!) 2.6 [CG]    Clinical Course User Index [CG] Liberty Handy, PA-C   76 year old male seizure on Keppra, CVA, hypertension presents to the ED for evaluation of "weakness in my legs" for several weeks. Endorses posterior bilateral knee pain worse with exertion and ambulating. PT aid who evaluated at home today for home PT advised him to come to ED for evaluation. No constitutional symptoms to suggest infection. No CP, light-headedness, palpitations to suggest cardiac etiology. No cough or URI symptoms. No lower extremity edema, no history of DVT. Exam and history not consistent with DVT. Given history of CVA in the past and reported weakness neurological exam was done to make sure there is no focal weakness, this was normal. I personally ambulated patient who had a slow but steady gait without ataxia. We'll get screening labs,  EKG and reassess.  Final Clinical Impressions(s) / ED Diagnoses   Final diagnoses:  Acute pain of both knees  Hypokalemia   CBC without leukocytosis or anemia. BMP showed hypokalemia with potassium 2.6, this was repleted in the ED. EKG without arrhythmia or ischemia. Muscle pain or "weakness" described by patient most likely from low potassium, he has not tingling or numbness or palpitations. Mg normal. Troponin normal. Bilateral knee x-rays done, normal. No symptoms or abnl lung exam to suggest PNA to warrant chest x-ray. No urinary symptoms, suprapubic or CVA tenderness, urinalysis not indicated.   Will considered safe for discharge with K supp x 1 week and PCP f/u for re-check of K and Mg within 1 week. Discussed plan with patient who verbalized understanding and is agreeable. He is aware of symptoms that would warrant return to ED for re-eval.   Patient, ED treatment and discharge plan was discussed with supervising physician who also evaluated the patient and is agreeable with plan.   New Prescriptions New Prescriptions   No medications on file     Jerrell Mylar 08/18/16 1616    Liberty Handy, PA-C 08/18/16 1630    Lavera Guise, MD 08/18/16 1739

## 2016-08-18 NOTE — ED Triage Notes (Signed)
Pt BIB [facility] from home where pt reports he had a home health aid come in an evaluate him today for physically therapy because he has been "feeling weak for weeks now". Home health nurse recommended pt come here to be evaluated r/t having hx. Of stroke. Pt reports he has been trying to exercise his legs but he has pain in his legs with bending and trouble getting around. Pt denies recent falls.

## 2016-08-18 NOTE — ED Notes (Signed)
Patient transported to X-ray 

## 2016-09-01 ENCOUNTER — Encounter (HOSPITAL_COMMUNITY): Payer: Self-pay

## 2016-09-01 ENCOUNTER — Emergency Department (HOSPITAL_COMMUNITY): Payer: Medicare Other

## 2016-09-01 ENCOUNTER — Emergency Department (HOSPITAL_COMMUNITY)
Admission: EM | Admit: 2016-09-01 | Discharge: 2016-09-02 | Disposition: A | Discharge status: Home / Self Care | Payer: Medicare Other | Source: Home / Self Care | Attending: Emergency Medicine | Admitting: Emergency Medicine

## 2016-09-01 DIAGNOSIS — Z79899 Other long term (current) drug therapy: Secondary | ICD-10-CM | POA: Insufficient documentation

## 2016-09-01 DIAGNOSIS — R1310 Dysphagia, unspecified: Secondary | ICD-10-CM | POA: Diagnosis not present

## 2016-09-01 DIAGNOSIS — Z87891 Personal history of nicotine dependence: Secondary | ICD-10-CM

## 2016-09-01 DIAGNOSIS — E860 Dehydration: Secondary | ICD-10-CM

## 2016-09-01 DIAGNOSIS — I10 Essential (primary) hypertension: Secondary | ICD-10-CM | POA: Insufficient documentation

## 2016-09-01 DIAGNOSIS — R531 Weakness: Secondary | ICD-10-CM | POA: Insufficient documentation

## 2016-09-01 DIAGNOSIS — R1312 Dysphagia, oropharyngeal phase: Secondary | ICD-10-CM | POA: Diagnosis not present

## 2016-09-01 LAB — COMPREHENSIVE METABOLIC PANEL
ALBUMIN: 3.6 g/dL (ref 3.5–5.0)
ALT: 29 U/L (ref 17–63)
AST: 38 U/L (ref 15–41)
Alkaline Phosphatase: 72 U/L (ref 38–126)
Anion gap: 19 — ABNORMAL HIGH (ref 5–15)
BILIRUBIN TOTAL: 3.9 mg/dL — AB (ref 0.3–1.2)
BUN: 45 mg/dL — ABNORMAL HIGH (ref 6–20)
CO2: 13 mmol/L — ABNORMAL LOW (ref 22–32)
Calcium: 9.3 mg/dL (ref 8.9–10.3)
Chloride: 101 mmol/L (ref 101–111)
Creatinine, Ser: 1.5 mg/dL — ABNORMAL HIGH (ref 0.61–1.24)
GFR calc non Af Amer: 44 mL/min — ABNORMAL LOW (ref >60)
GFR, EST AFRICAN AMERICAN: 51 mL/min — AB (ref >60)
GLUCOSE: 83 mg/dL (ref 65–99)
POTASSIUM: 4.9 mmol/L (ref 3.5–5.1)
SODIUM: 133 mmol/L — AB (ref 135–145)
TOTAL PROTEIN: 7.2 g/dL (ref 6.5–8.1)

## 2016-09-01 LAB — CBC WITH DIFFERENTIAL/PLATELET
Basophils Absolute: 0 10*3/uL (ref 0.0–0.1)
Basophils Relative: 0 %
Eosinophils Absolute: 0 10*3/uL (ref 0.0–0.7)
Eosinophils Relative: 0 %
HCT: 38.4 % — ABNORMAL LOW (ref 39.0–52.0)
Hemoglobin: 12.4 g/dL — ABNORMAL LOW (ref 13.0–17.0)
Lymphocytes Relative: 14 %
Lymphs Abs: 1.1 10*3/uL (ref 0.7–4.0)
MCH: 30 pg (ref 26.0–34.0)
MCHC: 32.3 g/dL (ref 30.0–36.0)
MCV: 92.8 fL (ref 78.0–100.0)
Monocytes Absolute: 0.6 10*3/uL (ref 0.1–1.0)
Monocytes Relative: 8 %
Neutro Abs: 6.6 10*3/uL (ref 1.7–7.7)
Neutrophils Relative %: 78 %
Platelets: 371 10*3/uL (ref 150–400)
RBC: 4.14 MIL/uL — ABNORMAL LOW (ref 4.22–5.81)
RDW: 14.1 % (ref 11.5–15.5)
WBC: 8.3 10*3/uL (ref 4.0–10.5)

## 2016-09-01 LAB — I-STAT CG4 LACTIC ACID, ED
Lactic Acid, Venous: 3.08 mmol/L (ref 0.5–1.9)
Lactic Acid, Venous: 2.2 mmol/L (ref 0.5–1.9)

## 2016-09-01 LAB — URINALYSIS, ROUTINE W REFLEX MICROSCOPIC
Bilirubin Urine: NEGATIVE
Glucose, UA: NEGATIVE mg/dL
HGB URINE DIPSTICK: NEGATIVE
Ketones, ur: 20 mg/dL — AB
Leukocytes, UA: NEGATIVE
NITRITE: NEGATIVE
PROTEIN: NEGATIVE mg/dL
SPECIFIC GRAVITY, URINE: 1.025 (ref 1.005–1.030)
pH: 5 (ref 5.0–8.0)

## 2016-09-01 LAB — D-DIMER, QUANTITATIVE: D-Dimer, Quant: 2.32 ug/mL-FEU — ABNORMAL HIGH (ref 0.00–0.50)

## 2016-09-01 MED ORDER — IOPAMIDOL (ISOVUE-370) INJECTION 76%
INTRAVENOUS | Status: AC
  Administered 2016-09-01: 100 mL
  Filled 2016-09-01: qty 100

## 2016-09-01 MED ORDER — SODIUM CHLORIDE 0.9 % IV BOLUS (SEPSIS)
1000.0000 mL | Freq: Once | INTRAVENOUS | Status: AC
  Administered 2016-09-01: 1000 mL via INTRAVENOUS

## 2016-09-01 MED ORDER — SODIUM CHLORIDE 0.9 % IV BOLUS (SEPSIS)
500.0000 mL | Freq: Once | INTRAVENOUS | Status: AC
  Administered 2016-09-01: 500 mL via INTRAVENOUS

## 2016-09-01 MED ORDER — SODIUM CHLORIDE 0.9 % IV BOLUS (SEPSIS)
250.0000 mL | Freq: Once | INTRAVENOUS | Status: AC
  Administered 2016-09-01: 250 mL via INTRAVENOUS

## 2016-09-01 NOTE — ED Triage Notes (Signed)
Pt presents to the ed with ems. His homehealth nurse came to check on him today and he seemed very weak so she called 911. Pt reports weakness all over x 2 days, pt presents alert, oriented, breathing fast, denies any pain

## 2016-09-02 NOTE — ED Provider Notes (Signed)
[facility]-EMERGENCY DEPT Provider Note   CSN: [id number] Arrival date & time: 09/01/16  1701     History   Chief Complaint Chief Complaint  Patient presents with  . Weakness    HPI Kyle Dudley is a 76 y.o. male.  He presents for evaluation of weakness, which was reportedly onset over 2 days time, and was found somewhat worse today by his home health nurse, at his home.  He is unable to give additional history.  Level 5 caveat-poor historian    HPI  Past Medical History:  Diagnosis Date  . Seizures (HCC)   . Stroke [facility])     Patient Active Problem List   Diagnosis Date Noted  . Unspecified essential hypertension 05/13/2012  . Acute ischemic left MCA stroke (HCC) 05/11/2012  . Seizure disorder (HCC) 05/11/2012    History reviewed. No pertinent surgical history.     Home Medications    Prior to Admission medications   Medication Sig Start Date End Date Taking? Authorizing Provider  aspirin 81 MG chewable tablet Chew 1 tablet (81 mg total) by mouth daily. 05/20/12  Yes Love, Evlyn Kanner, PA-C  levETIRAcetam (KEPPRA XR) 500 MG 24 hr tablet Take 2 tablets (1,000 mg total) by mouth daily. 11/07/15  Yes Micki Riley, MD  lisinopril (PRINIVIL,ZESTRIL) 5 MG tablet Take 5 mg by mouth daily.   Yes [provider]  potassium chloride SA (K-DUR,KLOR-CON) 20 MEQ tablet Take 1 tablet (20 mEq total) by mouth daily. Patient not taking: Reported on 09/01/2016 08/18/16 08/25/16  Liberty Handy, PA-C    Family History Family History  Problem Relation Age of Onset  . Stroke Mother   . Stroke Sister        2000    Social History Social History  Substance Use Topics  . Smoking status: Former Smoker    Packs/day: 0.50    Years: 55.00    Types: Cigarettes    Quit date: 05/08/2012  . Smokeless tobacco: Never Used  . Alcohol use No     Allergies   Patient has no known allergies.   Review of Systems Review of Systems  Unable to perform ROS: Mental status change      Physical Exam Updated Vital Signs BP 106/64   Pulse 93   Temp 98.9 F (37.2 C) (Rectal)   Resp (!) 28   Wt 56.7 kg (125 lb)   SpO2 98%   BMI 20.80 kg/m   Physical Exam  Constitutional: He appears well-developed. No distress.  Elderly, frail  HENT:  Head: Normocephalic and atraumatic.  Right Ear: External ear normal.  Left Ear: External ear normal.  Eyes: Pupils are equal, round, and reactive to light. Conjunctivae and EOM are normal.  Neck: Normal range of motion and phonation normal. Neck supple.  Cardiovascular: Normal rate, regular rhythm and normal heart sounds.   Pulmonary/Chest: Effort normal and breath sounds normal. He exhibits no bony tenderness.  Abdominal: Soft. There is no tenderness.  Musculoskeletal: Normal range of motion.  Neurological: He is alert. No cranial nerve deficit or sensory deficit. He exhibits normal muscle tone. Coordination normal.  Skin: Skin is warm, dry and intact.  Psychiatric: He has a normal mood and affect. His behavior is normal.  Nursing note and vitals reviewed.    ED Treatments / Results  Labs (all labs ordered are listed, but only abnormal results are displayed) Labs Reviewed  COMPREHENSIVE METABOLIC PANEL - Abnormal; Notable for the following:  Result Value   Sodium 133 (*)    CO2 13 (*)    BUN 45 (*)    Creatinine, Ser 1.50 (*)    Total Bilirubin 3.9 (*)    GFR calc non Af Amer 44 (*)    GFR calc Af Amer 51 (*)    Anion gap 19 (*)    All other components within normal limits  CBC WITH DIFFERENTIAL/PLATELET - Abnormal; Notable for the following:    RBC 4.14 (*)    Hemoglobin 12.4 (*)    HCT 38.4 (*)    All other components within normal limits  D-DIMER, QUANTITATIVE (NOT AT [facility]) - Abnormal; Notable for the following:    D-Dimer, Quant 2.32 (*)    All other components within normal limits  URINALYSIS, ROUTINE W REFLEX MICROSCOPIC - Abnormal; Notable for the following:    Color, Urine AMBER (*)    Ketones,  ur 20 (*)    All other components within normal limits  I-STAT CG4 LACTIC ACID, ED - Abnormal; Notable for the following:    Lactic Acid, Venous 3.08 (*)    All other components within normal limits  I-STAT CG4 LACTIC ACID, ED - Abnormal; Notable for the following:    Lactic Acid, Venous 2.20 (*)    All other components within normal limits  CULTURE, BLOOD (ROUTINE X 2)  CULTURE, BLOOD (ROUTINE X 2)  URINE CULTURE    EKG  EKG Interpretation  Date/Time:  Tuesday September 01 2016 17:03:28 EDT Ventricular Rate:  103 PR Interval:    QRS Duration: 91 QT Interval:  343 QTC Calculation: 449 R Axis:   72 Text Interpretation:  Sinus tachycardia RSR' in V1 or V2, right VCD or RVH Baseline wander in lead(s) I aVL Since last tracing No significant change was found Confirmed by Mancel BaleWentz, Jakub Debold [phone]) on 09/01/2016 5:57:37 PM       Radiology Ct Angio Chest Pe W/cm &/or Wo Cm  Result Date: 09/01/2016 CLINICAL DATA:  Acute onset of elevated D-dimer and shortness of breath. Initial encounter. EXAM: CT ANGIOGRAPHY CHEST WITH CONTRAST TECHNIQUE: Multidetector CT imaging of the chest was performed using the standard protocol during bolus administration of intravenous contrast. Multiplanar CT image reconstructions and MIPs were obtained to evaluate the vascular anatomy. CONTRAST:  80 mL of Isovue 370 IV contrast COMPARISON:  Chest radiograph performed earlier today at 6:48 p.m. FINDINGS: Cardiovascular:  There is no evidence of pulmonary embolus. Scattered coronary artery calcifications are seen. The heart is normal in size. Scattered calcification is noted along the aortic arch. Mild mural thrombus is seen along the descending thoracic aorta, without significant luminal narrowing. Mild mural thrombus is also noted along the proximal left subclavian artery, without significant luminal narrowing. Mediastinum/Nodes: The mediastinum is unremarkable in appearance. No mediastinal lymphadenopathy is seen. No  pericardial effusion is identified. The visualized portions of the thyroid gland are unremarkable. No axillary lymphadenopathy is seen. Lungs/Pleura: Scattered peripheral blebs are noted bilaterally, reflecting mild emphysema. Mild atelectasis is noted at the left lung base. No pleural effusion or pneumothorax is seen. No masses are identified. Upper Abdomen: The visualized portions of the liver and spleen are grossly unremarkable. The visualized portions of the gallbladder, adrenal glands and left kidney are within normal limits. Musculoskeletal: No acute osseous abnormalities are identified. The visualized musculature is unremarkable in appearance. Review of the MIP images confirms the above findings. IMPRESSION: 1. No evidence of pulmonary embolus. 2. Scattered peripheral blebs noted bilaterally, reflecting mild emphysema. Mild atelectasis  at the left lung base. 3. Scattered coronary artery calcifications. Electronically Signed   By: Roanna Raider M.D.   On: 09/01/2016 23:42   Dg Chest Port 1 View  Result Date: 09/01/2016 CLINICAL DATA:  Weakness EXAM: PORTABLE CHEST 1 VIEW COMPARISON:  May 10, 2012 FINDINGS: There is no edema or consolidation. There is left apical pleural calcification. Heart size and pulmonary vascularity are normal. No adenopathy. There is aortic atherosclerosis. No evident bone lesions. IMPRESSION: Left apical pleural calcification, likely due to previous inflammatory focus in this area. No edema or consolidation. There is aortic atherosclerosis. Aortic Atherosclerosis (ICD10-I70.0). Electronically Signed   By: Bretta Bang III M.D.   On: 09/01/2016 19:08    Procedures Procedures (including critical care time)  Medications Ordered in ED Medications  sodium chloride 0.9 % bolus 1,000 mL (0 mLs Intravenous Stopped 09/01/16 2142)    And  sodium chloride 0.9 % bolus 500 mL (0 mLs Intravenous Stopped 09/01/16 2142)    And  sodium chloride 0.9 % bolus 250 mL (0 mLs Intravenous  Stopped 09/01/16 2142)  sodium chloride 0.9 % bolus 1,000 mL (0 mLs Intravenous Stopped 09/02/16 0134)  iopamidol (ISOVUE-370) 76 % injection (100 mLs  Contrast Given 09/01/16 2246)     Initial Impression / Assessment and Plan / ED Course  I have reviewed the triage vital signs and the nursing notes.  Pertinent labs & imaging results that were available during my care of the patient were reviewed by me and considered in my medical decision making (see chart for details).      Patient Vitals for the past 24 hrs:  BP Temp Temp src Pulse Resp SpO2 Weight  09/02/16 0200 106/64 - - 93 - 98 % -  09/02/16 0145 (!) 109/59 - - 92 - 99 % -  09/02/16 0131 105/62 - - 98 - 98 % -  09/02/16 0115 94/65 - - 99 - 99 % -  09/02/16 0030 111/72 - - (!) 101 - 99 % -  09/01/16 2345 117/70 - - (!) 101 - 99 % -  09/01/16 2315 117/61 - - 97 - 99 % -  09/01/16 2215 124/79 - - (!) 104 - 100 % -  09/01/16 2145 128/76 - - (!) 108 - 100 % -  09/01/16 2115 (!) 104/56 - - (!) 102 - 100 % -  09/01/16 2045 133/84 - - (!) 102 (!) 28 99 % -  09/01/16 1954 - 98.9 F (37.2 C) Rectal - - - -  09/01/16 1945 96/69 - - 96 - 98 % -  09/01/16 1745 (!) 118/47 - - (!) 101 - 98 % -  09/01/16 1704 (!) 144/64 - - (!) 112 (!) 34 100 % 56.7 kg (125 lb)    1:31 AM Reevaluation with update and discussion. After initial assessment and treatment, an updated evaluation reveals he is alert and calm. No further c/o. Able to stand to urinate. States he uses cane at home.He has appointment with PCP in 2 days for a check up. Mancel Bale L    Final Clinical Impressions(s) / ED Diagnoses   Final diagnoses:  Weakness  Dehydration    Weakness, with mild renal insufficiency, suspected to be secondary to dehydration.  Imaging ordered for rule out PE, due to elevated d-dimer.  No PE found on imaging.  Nursing Notes Reviewed/ Care Coordinated Applicable Imaging Reviewed Interpretation of Laboratory Data incorporated into ED  treatment  The patient appears reasonably screened and/or stabilized for discharge  and I doubt any other medical condition or other [facility] requiring further screening, evaluation, or treatment in the ED at this time prior to discharge.  Plan: Home Medications-continue usual medications; Home Treatments-increase oral fluid intake; return here if the recommended treatment, does not improve the symptoms; Recommended follow up-PCP checkup in 2 days, as scheduled.   New Prescriptions Discharge Medication List as of 09/02/2016  2:33 AM       Mancel Bale, MD 09/02/16 1359

## 2016-09-02 NOTE — Discharge Instructions (Signed)
Try to drink 1-2 quarts of water each day.   Always use your cane when walking.

## 2016-09-03 LAB — URINE CULTURE

## 2016-09-04 ENCOUNTER — Emergency Department (HOSPITAL_COMMUNITY): Payer: Medicare Other

## 2016-09-04 ENCOUNTER — Inpatient Hospital Stay (HOSPITAL_COMMUNITY)
Admission: EM | Admit: 2016-09-04 | Discharge: 2016-09-07 | DRG: 391 | Disposition: A | Discharge status: Skilled Nursing Facility | Payer: Medicare Other | Attending: Internal Medicine | Admitting: Internal Medicine

## 2016-09-04 ENCOUNTER — Encounter (HOSPITAL_COMMUNITY): Payer: Self-pay

## 2016-09-04 DIAGNOSIS — Z8673 Personal history of transient ischemic attack (TIA), and cerebral infarction without residual deficits: Secondary | ICD-10-CM

## 2016-09-04 DIAGNOSIS — Z79899 Other long term (current) drug therapy: Secondary | ICD-10-CM

## 2016-09-04 DIAGNOSIS — I10 Essential (primary) hypertension: Secondary | ICD-10-CM | POA: Diagnosis present

## 2016-09-04 DIAGNOSIS — E43 Unspecified severe protein-calorie malnutrition: Secondary | ICD-10-CM | POA: Diagnosis present

## 2016-09-04 DIAGNOSIS — Z7982 Long term (current) use of aspirin: Secondary | ICD-10-CM

## 2016-09-04 DIAGNOSIS — R531 Weakness: Secondary | ICD-10-CM

## 2016-09-04 DIAGNOSIS — R17 Unspecified jaundice: Secondary | ICD-10-CM | POA: Diagnosis present

## 2016-09-04 DIAGNOSIS — G40909 Epilepsy, unspecified, not intractable, without status epilepticus: Secondary | ICD-10-CM

## 2016-09-04 DIAGNOSIS — R5381 Other malaise: Secondary | ICD-10-CM | POA: Diagnosis present

## 2016-09-04 DIAGNOSIS — I63512 Cerebral infarction due to unspecified occlusion or stenosis of left middle cerebral artery: Secondary | ICD-10-CM | POA: Diagnosis present

## 2016-09-04 DIAGNOSIS — D519 Vitamin B12 deficiency anemia, unspecified: Secondary | ICD-10-CM | POA: Diagnosis present

## 2016-09-04 DIAGNOSIS — R634 Abnormal weight loss: Secondary | ICD-10-CM

## 2016-09-04 DIAGNOSIS — R1312 Dysphagia, oropharyngeal phase: Principal | ICD-10-CM | POA: Diagnosis present

## 2016-09-04 DIAGNOSIS — Z681 Body mass index (BMI) 19 or less, adult: Secondary | ICD-10-CM

## 2016-09-04 DIAGNOSIS — R627 Adult failure to thrive: Secondary | ICD-10-CM | POA: Diagnosis present

## 2016-09-04 DIAGNOSIS — R1310 Dysphagia, unspecified: Secondary | ICD-10-CM

## 2016-09-04 LAB — URINALYSIS, ROUTINE W REFLEX MICROSCOPIC
BACTERIA UA: NONE SEEN
BILIRUBIN URINE: NEGATIVE
Glucose, UA: NEGATIVE mg/dL
HGB URINE DIPSTICK: NEGATIVE
KETONES UR: 20 mg/dL — AB
Nitrite: NEGATIVE
PROTEIN: NEGATIVE mg/dL
Specific Gravity, Urine: 1.028 (ref 1.005–1.030)
pH: 5 (ref 5.0–8.0)

## 2016-09-04 LAB — CBC
HCT: 36.3 % — ABNORMAL LOW (ref 39.0–52.0)
HEMOGLOBIN: 12.1 g/dL — AB (ref 13.0–17.0)
MCH: 31.1 pg (ref 26.0–34.0)
MCHC: 33.3 g/dL (ref 30.0–36.0)
MCV: 93.3 fL (ref 78.0–100.0)
PLATELETS: 317 10*3/uL (ref 150–400)
RBC: 3.89 MIL/uL — ABNORMAL LOW (ref 4.22–5.81)
RDW: 15 % (ref 11.5–15.5)
WBC: 9.9 10*3/uL (ref 4.0–10.5)

## 2016-09-04 LAB — COMPREHENSIVE METABOLIC PANEL
ALBUMIN: 4.2 g/dL (ref 3.5–5.0)
ALT: 32 U/L (ref 17–63)
ANION GAP: 16 — AB (ref 5–15)
AST: 40 U/L (ref 15–41)
Alkaline Phosphatase: 71 U/L (ref 38–126)
BUN: 33 mg/dL — AB (ref 6–20)
CHLORIDE: 106 mmol/L (ref 101–111)
CO2: 17 mmol/L — AB (ref 22–32)
CREATININE: 1.3 mg/dL — AB (ref 0.61–1.24)
Calcium: 9.5 mg/dL (ref 8.9–10.3)
GFR calc non Af Amer: 52 mL/min — ABNORMAL LOW (ref >60)
GLUCOSE: 94 mg/dL (ref 65–99)
POTASSIUM: 4.6 mmol/L (ref 3.5–5.1)
SODIUM: 139 mmol/L (ref 135–145)
Total Bilirubin: 4.9 mg/dL — ABNORMAL HIGH (ref 0.3–1.2)
Total Protein: 7.9 g/dL (ref 6.5–8.1)

## 2016-09-04 LAB — I-STAT CG4 LACTIC ACID, ED
LACTIC ACID, VENOUS: 2.86 mmol/L — AB (ref 0.5–1.9)
Lactic Acid, Venous: 2.2 mmol/L (ref 0.5–1.9)

## 2016-09-04 LAB — I-STAT TROPONIN, ED: TROPONIN I, POC: 0.02 ng/mL (ref 0.00–0.08)

## 2016-09-04 LAB — LIPASE, BLOOD: Lipase: 34 U/L (ref 11–51)

## 2016-09-04 MED ORDER — SODIUM CHLORIDE 0.9 % IV BOLUS (SEPSIS)
1000.0000 mL | Freq: Once | INTRAVENOUS | Status: AC
  Administered 2016-09-04: 1000 mL via INTRAVENOUS

## 2016-09-04 NOTE — ED Notes (Signed)
Pt ambulated in hall very slowly with walker without assistance. He stated that he felt as weak as he did when he arrived.

## 2016-09-04 NOTE — ED Notes (Signed)
Bed: [id number] Expected date:  Expected time:  Means of arrival:  Comments: EMS 76 y/o weakness

## 2016-09-04 NOTE — ED Provider Notes (Signed)
[facility]-EMERGENCY DEPT Provider Note   CSN: [id number] Arrival date & time: 09/04/16  1653     History   Chief Complaint Chief Complaint  Patient presents with  . Weakness    HPI Kyle Dudley is a 76 y.o. male with a h/o of CVA and seizures who present to the Emergency Department from his PCP's office by EMS for generalized weakness.   He reports that he has been evaluated in the emergency department several times over the last month but reports worsening, constant difficulty with ambulating, standing, and dysphagia. He states that his right knee is painful. He ambulates with a cane at baseline. He denies recent falls, fever, chills,chest pain, dyspnea, syncope, seizures, or numbness.  He also complains of dysphagia that has been ongoing and worsening. He states that his diet consists of mostly mayonnaise sandwiches because he is unable to chew and swallow his foot. He states if I put it in my mouth "it comes right back out." Per EMS, he was noted to have difficulty swallowing water at his PCP's office.  He states that he lives alone. He has one nephew and niece that lives in the area who he has not seen >3 weeks.   The history is provided by the patient. No language interpreter was used.    Past Medical History:  Diagnosis Date  . Seizures (HCC)   . Stroke [facility])     Patient Active Problem List   Diagnosis Date Noted  . Dysphagia 09/05/2016  . Weight loss 09/05/2016  . Weakness 09/05/2016  . Unspecified essential hypertension 05/13/2012  . Acute ischemic left MCA stroke (HCC) 05/11/2012  . Seizure disorder (HCC) 05/11/2012    History reviewed. No pertinent surgical history.     Home Medications    Prior to Admission medications   Medication Sig Start Date End Date Taking? Authorizing Provider  aspirin 81 MG chewable tablet Chew 1 tablet (81 mg total) by mouth daily. 05/20/12  Yes Love, Evlyn Kanner, PA-C  levETIRAcetam (KEPPRA XR) 500 MG 24 hr tablet Take 2 tablets (1,000 mg  total) by mouth daily. 11/07/15  Yes Micki Riley, MD  potassium chloride SA (K-DUR,KLOR-CON) 20 MEQ tablet Take 1 tablet (20 mEq total) by mouth daily. Patient not taking: Reported on 09/01/2016 08/18/16 08/25/16  Liberty Handy, PA-C    Family History Family History  Problem Relation Age of Onset  . Stroke Mother   . Stroke Sister        2000    Social History Social History  Substance Use Topics  . Smoking status: Former Smoker    Packs/day: 0.50    Years: 55.00    Types: Cigarettes    Quit date: 05/08/2012  . Smokeless tobacco: Never Used  . Alcohol use No     Allergies   Patient has no known allergies.   Review of Systems Review of Systems  Constitutional: Positive for unexpected weight change. Negative for activity change, chills and fever.  HENT: Positive for trouble swallowing.   Eyes: Negative for visual disturbance.  Respiratory: Negative for shortness of breath.   Cardiovascular: Negative for chest pain.  Gastrointestinal: Negative for abdominal pain, diarrhea, nausea and vomiting.  Genitourinary: Negative for dysuria.  Musculoskeletal: Positive for arthralgias, gait problem and myalgias. Negative for back pain.  Skin: Negative for rash and wound.  Neurological: Positive for weakness. Negative for syncope and headaches.  Psychiatric/Behavioral: Negative for confusion.     Physical Exam Updated Vital Signs BP (!) 146/78 (BP  Location: Right Arm)   Pulse (!) 105   Temp 98.9 F (37.2 C) (Oral)   Resp 16   Ht 5\' 10"  (1.778 m)   Wt 56.2 kg (124 lb)   SpO2 100%   BMI 17.79 kg/m   Physical Exam  Constitutional: He is oriented to person, place, and time. He appears well-developed.  Cachectic  HENT:  Head: Normocephalic.  Poor dentition  Eyes: Pupils are equal, round, and reactive to light. Conjunctivae and EOM are normal.  Neck: Normal range of motion. Neck supple.  Cardiovascular: Normal rate and regular rhythm.   No murmur  heard. Pulmonary/Chest: Effort normal and breath sounds normal. He has no wheezes. He has no rales.  Abdominal: Soft. Bowel sounds are normal. He exhibits no distension. There is no tenderness. There is no rebound and no guarding.  Musculoskeletal:  Increased pain with range of motion of the right knee.  Neurological: He is alert and oriented to person, place, and time.  Cranial nerves 2-12 intact. Finger-to-nose is normal. 5/5 motor strength of the bilateral upper and lower extremities. Moves all four extremities. Negative Romberg. Ambulatory without difficulty. NVI.    Skin: Skin is warm and dry. Capillary refill takes less than 2 seconds. No rash noted.  Psychiatric: His behavior is normal.  Nursing note and vitals reviewed.  ED Treatments / Results  Labs (all labs ordered are listed, but only abnormal results are displayed) Labs Reviewed  CBC - Abnormal; Notable for the following:       Result Value   RBC 3.89 (*)    Hemoglobin 12.1 (*)    HCT 36.3 (*)    All other components within normal limits  COMPREHENSIVE METABOLIC PANEL - Abnormal; Notable for the following:    CO2 17 (*)    BUN 33 (*)    Creatinine, Ser 1.30 (*)    Total Bilirubin 4.9 (*)    GFR calc non Af Amer 52 (*)    Anion gap 16 (*)    All other components within normal limits  URINALYSIS, ROUTINE W REFLEX MICROSCOPIC - Abnormal; Notable for the following:    Color, Urine AMBER (*)    APPearance HAZY (*)    Ketones, ur 20 (*)    Leukocytes, UA TRACE (*)    Squamous Epithelial / LPF 0-5 (*)    All other components within normal limits  I-STAT CG4 LACTIC ACID, ED - Abnormal; Notable for the following:    Lactic Acid, Venous 2.86 (*)    All other components within normal limits  I-STAT CG4 LACTIC ACID, ED - Abnormal; Notable for the following:    Lactic Acid, Venous 2.20 (*)    All other components within normal limits  LIPASE, BLOOD  BASIC METABOLIC PANEL  CBC  CEA  PSA  AFP TUMOR MARKER  CANCER  ANTIGEN 19-9  I-STAT TROPONIN, ED    EKG  EKG Interpretation None       Radiology Ct Head Wo Contrast  Result Date: 09/04/2016 CLINICAL DATA:  Generalized weakness EXAM: CT HEAD WITHOUT CONTRAST TECHNIQUE: Contiguous axial images were obtained from the base of the skull through the vertex without intravenous contrast. COMPARISON:  Brain MRI 05/10/2012 FINDINGS: Brain: No mass lesion, intraparenchymal hemorrhage or extra-axial collection. No evidence of acute cortical infarct. There is periventricular hypoattenuation compatible with chronic microvascular disease. Vascular: No hyperdense vessel or unexpected calcification. Skull: Normal visualized skull base, calvarium and extracranial soft tissues. Sinuses/Orbits: No sinus fluid levels or advanced mucosal thickening. No  mastoid effusion. Normal orbits. IMPRESSION: Chronic microvascular ischemia without acute intracranial abnormality. Electronically Signed   By: Deatra RobinsonKevin  Herman M.D.   On: 09/04/2016 20:42    Procedures Procedures (including critical care time)  Medications Ordered in ED Medications  levETIRAcetam (KEPPRA XR) 24 hr tablet 1,000 mg (not administered)  aspirin chewable tablet 81 mg (not administered)  enoxaparin (LOVENOX) injection 40 mg (not administered)  feeding supplement (ENSURE ENLIVE) (ENSURE ENLIVE) liquid 237 mL (not administered)  sodium chloride 0.9 % bolus 1,000 mL (0 mLs Intravenous Stopped 09/04/16 2047)     Initial Impression / Assessment and Plan / ED Course  I have reviewed the triage vital signs and the nursing notes.  Pertinent labs & imaging results that were available during my care of the patient were reviewed by me and considered in my medical decision making (see chart for details).     76 year old male with worsening generalized weakness, weight loss, and dysphasia sent for further evaluation from his PCP's office. The patient was seen and evaluated with Dr. Ranae PalmsYelverton, attending physician.  Patient's lactate was elevated at 2.86; however, likely secondary to dehydration and is consistent with recent ED visits. No leukocytosis. The patient's bicarbonate is 17, and AG 16. These values are also consistent with previous ED visits, and do not appear to correlate with the patient clinically. He appears to have a slight AKI; Cr. 1.30, increased from his baseline of <1.  Negative troponin. No acute changes on EKG. The patient had a CT Angio and CXR performed 3 days ago during his most recent ED visit. A right knee X-ray was performed 7/24. The patient does not endorse any new falls so I will not repeat at this time. The patient was ambulated through the department and was noted to be quite unstable on his feet. CT head was unremarkable for acute changes. A swallow screen was performed by nursing staff, and he was able to tolerate liquids well, but was noted to struggle taking only a couple bites of his health and cracker. Given the patient's worsening dysphagia, weight loss, and concern for nutritional intake, consult to the hospitalist, Dr. Joneen Roachrosley, for inpatient admission and further evaluation and workup of dysphagia. The patient appears reasonably stabilized for admission considering the current resources, flow, and capabilities available in the ED at this time, and I doubt any other [facility] requiring further screening and/or treatment in the ED prior to admission.   Final Clinical Impressions(s) / ED Diagnoses   Final diagnoses:  Dysphagia, unspecified type  Loss of weight  Generalized weakness    New Prescriptions Current Discharge Medication List       Barkley BoardsMcDonald, Zaley Talley A, PA-C 09/05/16 0356    Loren RacerYelverton, David, MD 09/06/16 [phone]

## 2016-09-04 NOTE — ED Triage Notes (Signed)
Patient BIB EMS from doctors office. Patient c/o generalized weakness. [facility] recently for the same. Patient complains of difficulty standing, walking, and swallowing. Patient's PCP told EMS that he was concerned for failure to thrive. Per report, patient had difficulty swallowing water at the PCP's office. Patient does have history of HTN, stroke, and seizures.

## 2016-09-04 NOTE — ED Notes (Signed)
Patient sitting up in bed eating chicken noodle soup and drinking OJ

## 2016-09-05 ENCOUNTER — Inpatient Hospital Stay (HOSPITAL_COMMUNITY): Payer: Medicare Other

## 2016-09-05 DIAGNOSIS — Z79899 Other long term (current) drug therapy: Secondary | ICD-10-CM | POA: Diagnosis not present

## 2016-09-05 DIAGNOSIS — Z7982 Long term (current) use of aspirin: Secondary | ICD-10-CM | POA: Diagnosis not present

## 2016-09-05 DIAGNOSIS — R531 Weakness: Secondary | ICD-10-CM | POA: Diagnosis not present

## 2016-09-05 DIAGNOSIS — E43 Unspecified severe protein-calorie malnutrition: Secondary | ICD-10-CM | POA: Diagnosis present

## 2016-09-05 DIAGNOSIS — I10 Essential (primary) hypertension: Secondary | ICD-10-CM | POA: Diagnosis present

## 2016-09-05 DIAGNOSIS — R627 Adult failure to thrive: Secondary | ICD-10-CM | POA: Diagnosis present

## 2016-09-05 DIAGNOSIS — R1310 Dysphagia, unspecified: Secondary | ICD-10-CM

## 2016-09-05 DIAGNOSIS — R1312 Dysphagia, oropharyngeal phase: Secondary | ICD-10-CM | POA: Diagnosis present

## 2016-09-05 DIAGNOSIS — R17 Unspecified jaundice: Secondary | ICD-10-CM | POA: Diagnosis present

## 2016-09-05 DIAGNOSIS — Z681 Body mass index (BMI) 19 or less, adult: Secondary | ICD-10-CM | POA: Diagnosis not present

## 2016-09-05 DIAGNOSIS — D519 Vitamin B12 deficiency anemia, unspecified: Secondary | ICD-10-CM | POA: Diagnosis present

## 2016-09-05 DIAGNOSIS — Z8673 Personal history of transient ischemic attack (TIA), and cerebral infarction without residual deficits: Secondary | ICD-10-CM | POA: Diagnosis not present

## 2016-09-05 DIAGNOSIS — I63512 Cerebral infarction due to unspecified occlusion or stenosis of left middle cerebral artery: Secondary | ICD-10-CM | POA: Diagnosis not present

## 2016-09-05 DIAGNOSIS — G40909 Epilepsy, unspecified, not intractable, without status epilepticus: Secondary | ICD-10-CM | POA: Diagnosis not present

## 2016-09-05 DIAGNOSIS — R5381 Other malaise: Secondary | ICD-10-CM | POA: Diagnosis present

## 2016-09-05 DIAGNOSIS — R634 Abnormal weight loss: Secondary | ICD-10-CM

## 2016-09-05 LAB — BASIC METABOLIC PANEL
Anion gap: 11 (ref 5–15)
BUN: 24 mg/dL — AB (ref 6–20)
CALCIUM: 8.4 mg/dL — AB (ref 8.9–10.3)
CHLORIDE: 110 mmol/L (ref 101–111)
CO2: 18 mmol/L — AB (ref 22–32)
CREATININE: 0.95 mg/dL (ref 0.61–1.24)
GFR calc non Af Amer: >60 mL/min (ref >60)
GLUCOSE: 91 mg/dL (ref 65–99)
Potassium: 4.1 mmol/L (ref 3.5–5.1)
Sodium: 139 mmol/L (ref 135–145)

## 2016-09-05 LAB — CBC
HCT: 28.4 % — ABNORMAL LOW (ref 39.0–52.0)
Hemoglobin: 9.3 g/dL — ABNORMAL LOW (ref 13.0–17.0)
MCH: 30.2 pg (ref 26.0–34.0)
MCHC: 32.7 g/dL (ref 30.0–36.0)
MCV: 92.2 fL (ref 78.0–100.0)
PLATELETS: 296 10*3/uL (ref 150–400)
RBC: 3.08 MIL/uL — ABNORMAL LOW (ref 4.22–5.81)
RDW: 15.2 % (ref 11.5–15.5)
WBC: 6.4 10*3/uL (ref 4.0–10.5)

## 2016-09-05 LAB — IRON AND TIBC
IRON: 58 ug/dL (ref 45–182)
Saturation Ratios: 16 % — ABNORMAL LOW (ref 17.9–39.5)
TIBC: 356 ug/dL (ref 250–450)
UIBC: 298 ug/dL

## 2016-09-05 LAB — FOLATE: Folate: 4 ng/mL — ABNORMAL LOW (ref >5.9)

## 2016-09-05 LAB — RETICULOCYTES
RBC.: 3.17 MIL/uL — AB (ref 4.22–5.81)
RETIC CT PCT: 5.5 % — AB (ref 0.4–3.1)
Retic Count, Absolute: 174.4 10*3/uL (ref 19.0–186.0)

## 2016-09-05 LAB — BILIRUBIN, FRACTIONATED(TOT/DIR/INDIR)
BILIRUBIN DIRECT: 0.7 mg/dL — AB (ref 0.1–0.5)
BILIRUBIN TOTAL: 4.2 mg/dL — AB (ref 0.3–1.2)
Indirect Bilirubin: 3.5 mg/dL — ABNORMAL HIGH (ref 0.3–0.9)

## 2016-09-05 LAB — VITAMIN B12: Vitamin B-12: 169 pg/mL — ABNORMAL LOW (ref 180–914)

## 2016-09-05 LAB — FERRITIN: FERRITIN: 95 ng/mL (ref 24–336)

## 2016-09-05 LAB — TSH: TSH: 3.322 u[IU]/mL (ref 0.350–4.500)

## 2016-09-05 LAB — PSA: PROSTATIC SPECIFIC ANTIGEN: 0.26 ng/mL (ref 0.00–4.00)

## 2016-09-05 MED ORDER — ENOXAPARIN SODIUM 40 MG/0.4ML ~~LOC~~ SOLN
40.0000 mg | SUBCUTANEOUS | Status: DC
  Administered 2016-09-05 – 2016-09-07 (×3): 40 mg via SUBCUTANEOUS
  Filled 2016-09-05 (×3): qty 0.4

## 2016-09-05 MED ORDER — ASPIRIN 81 MG PO CHEW
81.0000 mg | CHEWABLE_TABLET | Freq: Every day | ORAL | Status: DC
  Administered 2016-09-05 – 2016-09-07 (×3): 81 mg via ORAL
  Filled 2016-09-05 (×3): qty 1

## 2016-09-05 MED ORDER — ENSURE ENLIVE PO LIQD
237.0000 mL | Freq: Two times a day (BID) | ORAL | Status: DC
  Administered 2016-09-05 – 2016-09-07 (×5): 237 mL via ORAL

## 2016-09-05 MED ORDER — LEVETIRACETAM ER 500 MG PO TB24
1000.0000 mg | ORAL_TABLET | Freq: Every day | ORAL | Status: DC
  Administered 2016-09-05 – 2016-09-06 (×2): 1000 mg via ORAL
  Filled 2016-09-05 (×2): qty 2

## 2016-09-05 MED ORDER — ORAL CARE MOUTH RINSE
15.0000 mL | Freq: Two times a day (BID) | OROMUCOSAL | Status: DC
  Administered 2016-09-05 – 2016-09-07 (×4): 15 mL via OROMUCOSAL

## 2016-09-05 MED ORDER — LEVETIRACETAM ER 500 MG PO TB24
1000.0000 mg | ORAL_TABLET | Freq: Every day | ORAL | Status: DC
  Filled 2016-09-05: qty 2

## 2016-09-05 NOTE — ED Notes (Signed)
Report to Polo RileyJane RN and is ready for patient

## 2016-09-05 NOTE — Progress Notes (Signed)
PROGRESS NOTE    Kyle Dudley  ZOX:[id number] DOB: [birth year] DOA: 09/04/2016 PCP: Macy Mis, MD  Brief Narrative: 76/M with h/o CVA and Remote Seizures weakness, weight loss and Dysphagia for 2months, unable to eat anything but soft foods, diet now limited to mayonnaise sandwiches   Assessment & Plan:   Principal Problem:   Dysphagia - worsening for 2 months, worse with solids - GI consult for EGD - SLP eval - RD consult  H/o  left MCA stroke (HCC) -continue ASA  Seizure disorder (HCC) -stable, continue Keppra   Malnutrition -RD consult  DVT prophylaxis:lovenox Code Status: Full Code Family Communication: None at bedside Disposition Plan: Home pending workup   [facility]    Subjective: C/o difficulty swallowing, weakness overall  Objective: Vitals:   09/05/16 0100 09/05/16 0110 09/05/16 0123 09/05/16 0554  BP: 106/70 106/70 (!) 146/78 (!) 122/56  Pulse: 93 94 (!) 105 95  Resp:  15 16 16   Temp:   98.9 F (37.2 C) 98.5 F (36.9 C)  TempSrc:   Oral Oral  SpO2: 100% 100% 100% 100%  Weight:   56.2 kg (124 lb)   Height:   5\' 10"  (1.778 m)     Intake/Output Summary (Last 24 hours) at 09/05/16 0914 Last data filed at 09/05/16 0600  Gross per 24 hour  Intake             1080 ml  Output                0 ml  Net             1080 ml   Filed Weights   09/04/16 1713 09/05/16 0123  Weight: 56.7 kg (125 lb) 56.2 kg (124 lb)    Examination:  General exam: Appears calm and comfortable, frail male Respiratory system: Clear to auscultation. Respiratory effort normal. Cardiovascular system: S1 & S2 heard, RRR. No JVD, murmurs Gastrointestinal system: Abdomen is nondistended, soft and nontender. Normal bowel sounds heard. Central nervous system: Alert and oriented. No focal neurological deficits. Extremities: Symmetric 5 x 5 power. Skin: No rashes, lesions or ulcers Psychiatry: Judgement and insight appear normal. Mood & affect appropriate.     Data Reviewed:     CBC:  Recent Labs Lab 09/01/16 2025 09/04/16 1852 09/05/16 0535  WBC 8.3 9.9 6.4  NEUTROABS 6.6  --   --   HGB 12.4* 12.1* 9.3*  HCT 38.4* 36.3* 28.4*  MCV 92.8 93.3 92.2  PLT 371 317 296   Basic Metabolic Panel:  Recent Labs Lab 09/01/16 2025 09/04/16 1852 09/05/16 0535  NA 133* 139 139  K 4.9 4.6 4.1  CL 101 106 110  CO2 13* 17* 18*  GLUCOSE 83 94 91  BUN 45* 33* 24*  CREATININE 1.50* 1.30* 0.95  CALCIUM 9.3 9.5 8.4*   GFR: Estimated Creatinine Clearance: 53.4 mL/min (by C-G formula based on SCr of 0.95 mg/dL). Liver Function Tests:  Recent Labs Lab 09/01/16 2025 09/04/16 1852  AST 38 40  ALT 29 32  ALKPHOS 72 71  BILITOT 3.9* 4.9*  PROT 7.2 7.9  ALBUMIN 3.6 4.2    Recent Labs Lab 09/04/16 1852  LIPASE 34   No results for input(s): AMMONIA in the last 168 hours. Coagulation Profile: No results for input(s): INR, PROTIME in the last 168 hours. Cardiac Enzymes: No results for input(s): CKTOTAL, CKMB, CKMBINDEX, TROPONINI in the last 168 hours. BNP (last 3 results) No results for input(s): PROBNP in the last 8760  hours. HbA1C: No results for input(s): HGBA1C in the last 72 hours. CBG: No results for input(s): GLUCAP in the last 168 hours. Lipid Profile: No results for input(s): CHOL, HDL, LDLCALC, TRIG, CHOLHDL, LDLDIRECT in the last 72 hours. Thyroid Function Tests: No results for input(s): TSH, T4TOTAL, FREET4, T3FREE, THYROIDAB in the last 72 hours. Anemia Panel: No results for input(s): VITAMINB12, FOLATE, FERRITIN, TIBC, IRON, RETICCTPCT in the last 72 hours. Urine analysis:    Component Value Date/Time   COLORURINE AMBER (A) 09/04/2016 1751   APPEARANCEUR HAZY (A) 09/04/2016 1751   LABSPEC 1.028 09/04/2016 1751   PHURINE 5.0 09/04/2016 1751   GLUCOSEU NEGATIVE 09/04/2016 1751   HGBUR NEGATIVE 09/04/2016 1751   BILIRUBINUR NEGATIVE 09/04/2016 1751   KETONESUR 20 (A) 09/04/2016 1751   PROTEINUR NEGATIVE 09/04/2016 1751    UROBILINOGEN 1.0 05/10/2012 2032   NITRITE NEGATIVE 09/04/2016 1751   LEUKOCYTESUR TRACE (A) 09/04/2016 1751   Sepsis Labs: @LABRCNTIP (procalcitonin:4,lacticidven:4)  ) Recent Results (from the past 240 hour(s))  Blood Culture (routine x 2)     Status: None (Preliminary result)   Collection Time: 09/01/16  8:17 PM  Result Value Ref Range Status   Specimen Description BLOOD RIGHT ANTECUBITAL  Final   Special Requests   Final    BOTTLES DRAWN AEROBIC AND ANAEROBIC Blood Culture adequate volume   Culture NO GROWTH 3 DAYS  Final   Report Status PENDING  Incomplete  Blood Culture (routine x 2)     Status: None (Preliminary result)   Collection Time: 09/01/16  8:25 PM  Result Value Ref Range Status   Specimen Description BLOOD LEFT ANTECUBITAL  Final   Special Requests IN PEDIATRIC BOTTLE Blood Culture adequate volume  Final   Culture NO GROWTH 3 DAYS  Final   Report Status PENDING  Incomplete  Urine culture     Status: Abnormal   Collection Time: 09/01/16  9:40 PM  Result Value Ref Range Status   Specimen Description URINE, CLEAN CATCH  Final   Special Requests NONE  Final   Culture MULTIPLE SPECIES PRESENT, SUGGEST RECOLLECTION (A)  Final   Report Status 09/03/2016 FINAL  Final         Radiology Studies: Ct Head Wo Contrast  Result Date: 09/04/2016 CLINICAL DATA:  Generalized weakness EXAM: CT HEAD WITHOUT CONTRAST TECHNIQUE: Contiguous axial images were obtained from the base of the skull through the vertex without intravenous contrast. COMPARISON:  Brain MRI 05/10/2012 FINDINGS: Brain: No mass lesion, intraparenchymal hemorrhage or extra-axial collection. No evidence of acute cortical infarct. There is periventricular hypoattenuation compatible with chronic microvascular disease. Vascular: No hyperdense vessel or unexpected calcification. Skull: Normal visualized skull base, calvarium and extracranial soft tissues. Sinuses/Orbits: No sinus fluid levels or advanced mucosal  thickening. No mastoid effusion. Normal orbits. IMPRESSION: Chronic microvascular ischemia without acute intracranial abnormality. Electronically Signed   By: Deatra RobinsonKevin  Herman M.D.   On: 09/04/2016 20:42        Scheduled Meds: . aspirin  81 mg Oral Daily  . enoxaparin (LOVENOX) injection  40 mg Subcutaneous Q24H  . feeding supplement (ENSURE ENLIVE)  237 mL Oral BID BM  . levETIRAcetam  1,000 mg Oral QHS   Continuous Infusions:   LOS: 0 days    Time spent: 35min    Zannie CovePreetha Terianna Peggs, MD [facility] Pager [phone]  If 7PM-7AM, please contact night-coverage [email or web address] Password [email or web address] 09/05/2016, 9:14 AM

## 2016-09-05 NOTE — Consult Note (Addendum)
Referring Provider: TH Primary Care Physician:  Macy Mis, MD Primary Gastroenterologist:  Gentry Fitz  Reason for Consultation:  Dysphagia  HPI: Kyle Dudley is a 76 y.o. male with past medical history of CVA, remote history of seizure disorder was brought into the hospital for further evaluation of weakness. She was complaining of difficulty swallowing. GI is consulted for further evaluation.  Patient seen and examined at bedside. Laying comfortably in the bed. He's been having intermittent dysphagia mostly to solids since last 2 months. Occasional nausea but denied any vomiting. Denied abdominal pain, diarrhea or constipation. Denied blood in the stool or black stool. He has lost around 9 pounds in 1 year duration.  No family history of colon cancer. No previous EGD or colonoscopy  Past Medical History:  Diagnosis Date  . Seizures (HCC)   . Stroke [facility])     History reviewed. No pertinent surgical history.  Prior to Admission medications   Medication Sig Start Date End Date Taking? Authorizing Provider  aspirin 81 MG chewable tablet Chew 1 tablet (81 mg total) by mouth daily. 05/20/12  Yes Love, Evlyn Kanner, PA-C  levETIRAcetam (KEPPRA XR) 500 MG 24 hr tablet Take 2 tablets (1,000 mg total) by mouth daily. 11/07/15  Yes Micki Riley, MD  potassium chloride SA (K-DUR,KLOR-CON) 20 MEQ tablet Take 1 tablet (20 mEq total) by mouth daily. Patient not taking: Reported on 09/01/2016 08/18/16 08/25/16  Liberty Handy, PA-C    Scheduled Meds: . aspirin  81 mg Oral Daily  . enoxaparin (LOVENOX) injection  40 mg Subcutaneous Q24H  . feeding supplement (ENSURE ENLIVE)  237 mL Oral BID BM  . levETIRAcetam  1,000 mg Oral QHS   Continuous Infusions: PRN Meds:.  Allergies as of 09/04/2016  . (No Known Allergies)    Family History  Problem Relation Age of Onset  . Stroke Mother   . Stroke Sister        2000    Social History   Social History  . Marital status: Single    Spouse  name: N/A  . Number of children: 2  . Years of education: 9th   Occupational History  . Retired    Social History Main Topics  . Smoking status: Former Smoker    Packs/day: 0.50    Years: 55.00    Types: Cigarettes    Quit date: 05/08/2012  . Smokeless tobacco: Never Used  . Alcohol use No  . Drug use: No  . Sexual activity: Not on file   Other Topics Concern  . Not on file   Social History Narrative   Patient is single and  lives at home alone.   Patient has two children.   Patient is retired.   Patient has a 9th grade education.   Caffeine Use: 5 cups of tea daily   Patient is right-handed.    Review of Systems: Review of Systems  Constitutional: Positive for weight loss. Negative for chills and fever.  HENT: Negative for ear discharge, ear pain, hearing loss and tinnitus.   Eyes: Negative for blurred vision and double vision.  Respiratory: Negative for cough and hemoptysis.   Cardiovascular: Negative for chest pain and palpitations.  Gastrointestinal: Positive for nausea. Negative for abdominal pain, blood in stool, constipation, diarrhea and vomiting.  Genitourinary: Negative for dysuria and urgency.  Musculoskeletal: Positive for back pain and myalgias.  Skin: Negative for rash.  Neurological: Positive for weakness. Negative for speech change and loss of consciousness.  Endo/Heme/Allergies: Does  not bruise/bleed easily.  Psychiatric/Behavioral: Negative for hallucinations and suicidal ideas.    Physical Exam: Vital signs: Vitals:   09/05/16 0123 09/05/16 0554  BP: (!) 146/78 (!) 122/56  Pulse: (!) 105 95  Resp: 16 16  Temp: 98.9 F (37.2 C) 98.5 F (36.9 C)  SpO2: 100% 100%   Last BM Date: 09/04/16 Physical Exam  Constitutional: He is oriented to person, place, and time. No distress.  Thin-appearing elderly patient  HENT:  Head: Normocephalic and atraumatic.  Mouth/Throat: No oropharyngeal exudate.  Poor dentition  Eyes: EOM are normal. Scleral  icterus is present.  Neck: Normal range of motion. Neck supple. No thyromegaly present.  Cardiovascular: Normal rate, regular rhythm and normal heart sounds.   Pulmonary/Chest: Effort normal and breath sounds normal. No respiratory distress.  Abdominal: Soft. Bowel sounds are normal. He exhibits no distension. There is no tenderness. There is no guarding.  Musculoskeletal: He exhibits no edema or tenderness.  Neurological: He is alert and oriented to person, place, and time.  Skin: Skin is warm. No erythema.  Psychiatric: He has a normal mood and affect. His behavior is normal. Thought content normal.    GI:  Lab Results:  Recent Labs  09/04/16 1852 09/05/16 0535  WBC 9.9 6.4  HGB 12.1* 9.3*  HCT 36.3* 28.4*  PLT 317 296   BMET  Recent Labs  09/04/16 1852 09/05/16 0535  NA 139 139  K 4.6 4.1  CL 106 110  CO2 17* 18*  GLUCOSE 94 91  BUN 33* 24*  CREATININE 1.30* 0.95  CALCIUM 9.5 8.4*   LFT  Recent Labs  09/04/16 1852  PROT 7.9  ALBUMIN 4.2  AST 40  ALT 32  ALKPHOS 71  BILITOT 4.9*   PT/INR No results for input(s): LABPROT, INR in the last 72 hours.   Studies/Results: Ct Head Wo Contrast  Result Date: 09/04/2016 CLINICAL DATA:  Generalized weakness EXAM: CT HEAD WITHOUT CONTRAST TECHNIQUE: Contiguous axial images were obtained from the base of the skull through the vertex without intravenous contrast. COMPARISON:  Brain MRI 05/10/2012 FINDINGS: Brain: No mass lesion, intraparenchymal hemorrhage or extra-axial collection. No evidence of acute cortical infarct. There is periventricular hypoattenuation compatible with chronic microvascular disease. Vascular: No hyperdense vessel or unexpected calcification. Skull: Normal visualized skull base, calvarium and extracranial soft tissues. Sinuses/Orbits: No sinus fluid levels or advanced mucosal thickening. No mastoid effusion. Normal orbits. IMPRESSION: Chronic microvascular ischemia without acute intracranial  abnormality. Electronically Signed   By: Deatra RobinsonKevin  Herman M.D.   On: 09/04/2016 20:42    Impression/Plan: - Dysphagia to solid foods since last 2 months. - 9 pound weight loss over 1 year - Anemia. No overt bleeding. Most likely dilutional. - History of CVA and history of seizure disorder - Elevated total bilirubin at 4.9. Her LFTs are normal. - Poor dentition  Recommendations -------------------------  - Differential diagnoses include esophageal stricture, esophageal ring and esophageal malignancy (Given his weight loss) - Offered EGD for further evaluation. Patient prefers noninvasive workup first. We will order barium swallow. Need for EGD if barium swallow comes back positive was discussed. Patient verbalized understanding. - Hemoglobin appears to be dilutional. Fractionation of bilirubin to rule out indirect hyperbilirubinemia - Okay to continue dysphagia diet for now. GI will follow    LOS: 0 days   Kathi DerParag Davaughn Hillyard  MD, FACP 09/05/2016, 10:16 AM  Pager [phone] If no answer or after 5 PM call [phone]

## 2016-09-05 NOTE — H&P (Signed)
PCP:   Macy Mis, MD   Chief Complaint:  Difficulty swallowing  HPI: This is a 76 year old male who lives alone. He states his been feeling weak and not walking properly for the past the 2 months. He states is also not been eating and has had significant weight loss as a result. When asked why he is not eating he states he has difficulty swallowing. He states he has more difficulty swallowing solids and liquids. She denies that food being hung in his throat. He states he kept the food, but he does not vomited it back up. There is no  report of any other localized weakness but the patient does have generalized weakness. He came to ER.   Review of Systems:  The patient denies anorexia, fever, weight loss,, vision loss, decreased hearing, dysphagia, hoarseness, chest pain, syncope, dyspnea on exertion, peripheral edema, balance deficits, hemoptysis, abdominal pain, melena, hematochezia, severe indigestion/heartburn, hematuria, incontinence, genital sores, muscle weakness, suspicious skin lesions, transient blindness, difficulty walking, depression, unusual weight change, abnormal bleeding, enlarged lymph nodes, angioedema, and breast masses.  Past Medical History: Past Medical History:  Diagnosis Date  . Seizures (HCC)   . Stroke [facility])    History reviewed. No pertinent surgical history.  Medications: Prior to Admission medications   Medication Sig Start Date End Date Taking? Authorizing Provider  aspirin 81 MG chewable tablet Chew 1 tablet (81 mg total) by mouth daily. 05/20/12  Yes Love, Evlyn Kanner, PA-C  levETIRAcetam (KEPPRA XR) 500 MG 24 hr tablet Take 2 tablets (1,000 mg total) by mouth daily. 11/07/15  Yes Micki Riley, MD  potassium chloride SA (K-DUR,KLOR-CON) 20 MEQ tablet Take 1 tablet (20 mEq total) by mouth daily. Patient not taking: Reported on 09/01/2016 08/18/16 08/25/16  Liberty Handy, PA-C    Allergies:  No Known Allergies  Social History:  reports that he quit  smoking about 4 years ago. His smoking use included Cigarettes. He has a 27.50 pack-year smoking history. He has never used smokeless tobacco. He reports that he does not drink alcohol or use drugs.  Family History: Family History  Problem Relation Age of Onset  . Stroke Mother   . Stroke Sister        2000    Physical Exam: Vitals:   09/04/16 2218 09/04/16 2341 09/05/16 0000 09/05/16 0030  BP:  (!) 105/56 107/69 109/74  Pulse:  98 97 94  Resp:  16    Temp: 97.8 F (36.6 C)     TempSrc:      SpO2:  100% 100% 100%  Weight:        General:  Alert and oriented times three, Cachectic male, no acute distress Eyes: PERRLA, pink conjunctiva, no scleral icterus ENT: Moist oral mucosa, neck supple, no thyromegaly, dental caries, halitosis Lungs: clear to ascultation, no wheeze, no crackles, no use of accessory muscles Cardiovascular: regular rate and rhythm, no regurgitation, no gallops, no murmurs. No carotid bruits, no JVD Abdomen: soft, positive BS, non-tender, non-distended, no organomegaly, not an acute abdomen GU: not examined Neuro: CN II - XII grossly intact, sensation intact Musculoskeletal: strength 5/5 all extremities, no clubbing, cyanosis or edema Skin: no rash, no subcutaneous crepitation, no decubitus Psych: appropriate patient   Labs on Admission:   Recent Labs  09/04/16 1852  NA 139  K 4.6  CL 106  CO2 17*  GLUCOSE 94  BUN 33*  CREATININE 1.30*  CALCIUM 9.5    Recent Labs  09/04/16 1852  AST 40  ALT 32  ALKPHOS 71  BILITOT 4.9*  PROT 7.9  ALBUMIN 4.2    Recent Labs  09/04/16 1852  LIPASE 34    Recent Labs  09/04/16 1852  WBC 9.9  HGB 12.1*  HCT 36.3*  MCV 93.3  PLT 317   No results for input(s): CKTOTAL, CKMB, CKMBINDEX, TROPONINI in the last 72 hours. Invalid input(s): POCBNP No results for input(s): DDIMER in the last 72 hours. No results for input(s): HGBA1C in the last 72 hours. No results for input(s): CHOL, HDL, LDLCALC,  TRIG, CHOLHDL, LDLDIRECT in the last 72 hours. No results for input(s): TSH, T4TOTAL, T3FREE, THYROIDAB in the last 72 hours.  Invalid input(s): FREET3 No results for input(s): VITAMINB12, FOLATE, FERRITIN, TIBC, IRON, RETICCTPCT in the last 72 hours.  Micro Results: Recent Results (from the past 240 hour(s))  Blood Culture (routine x 2)     Status: None (Preliminary result)   Collection Time: 09/01/16  8:17 PM  Result Value Ref Range Status   Specimen Description BLOOD RIGHT ANTECUBITAL  Final   Special Requests   Final    BOTTLES DRAWN AEROBIC AND ANAEROBIC Blood Culture adequate volume   Culture NO GROWTH 3 DAYS  Final   Report Status PENDING  Incomplete  Blood Culture (routine x 2)     Status: None (Preliminary result)   Collection Time: 09/01/16  8:25 PM  Result Value Ref Range Status   Specimen Description BLOOD LEFT ANTECUBITAL  Final   Special Requests IN PEDIATRIC BOTTLE Blood Culture adequate volume  Final   Culture NO GROWTH 3 DAYS  Final   Report Status PENDING  Incomplete  Urine culture     Status: Abnormal   Collection Time: 09/01/16  9:40 PM  Result Value Ref Range Status   Specimen Description URINE, CLEAN CATCH  Final   Special Requests NONE  Final   Culture MULTIPLE SPECIES PRESENT, SUGGEST RECOLLECTION (A)  Final   Report Status 09/03/2016 FINAL  Final     Radiological Exams on Admission: Ct Head Wo Contrast  Result Date: 09/04/2016 CLINICAL DATA:  Generalized weakness EXAM: CT HEAD WITHOUT CONTRAST TECHNIQUE: Contiguous axial images were obtained from the base of the skull through the vertex without intravenous contrast. COMPARISON:  Brain MRI 05/10/2012 FINDINGS: Brain: No mass lesion, intraparenchymal hemorrhage or extra-axial collection. No evidence of acute cortical infarct. There is periventricular hypoattenuation compatible with chronic microvascular disease. Vascular: No hyperdense vessel or unexpected calcification. Skull: Normal visualized skull base,  calvarium and extracranial soft tissues. Sinuses/Orbits: No sinus fluid levels or advanced mucosal thickening. No mastoid effusion. Normal orbits. IMPRESSION: Chronic microvascular ischemia without acute intracranial abnormality. Electronically Signed   By: Deatra RobinsonKevin  Herman M.D.   On: 09/04/2016 20:42    Assessment/Plan Present on Admission: Dysphagia -Admit to MedSurg  -Differential diagnosis includes esophageal strictures, dysphagia post CVA -Will consult GI for possible EGD (patient with dental caries), consult speech was full evaluation -Dysphagia diet ordered  Weakness Weight loss -aware, due to above. -PT consulted -Will order tumor markers  . h/o ischemic left MCA stroke (HCC) -aware  Genita Nilsson 09/05/2016, 12:50 AM

## 2016-09-05 NOTE — ED Notes (Signed)
ED TO INPATIENT HANDOFF REPORT  Name/Age/Gender Kyle Dudley 76 y.o. male  Home/SNF/Other Home  Chief Complaint generalized weakness  Code Status History    This patient does not have a recorded code status. Please follow your organizational policy for patients in this situation.      Level of Care/Admitting Diagnosis ED Disposition    ED Disposition Condition [facility] Area: [facility] [[id number]]  Level of Care: Med-Surg [16]  Diagnosis: Dysphagia [324401]  Admitting Physician: Quintella Baton [[id number]]  Attending Physician: Quintella Baton [[id number]]  Estimated length of stay: past midnight tomorrow  Certification:: I certify this patient will need inpatient services for at least 2 midnights  PT Class (Do Not Modify): Inpatient [101]  PT Acc Code (Do Not Modify): Private [1]       Medical History Past Medical History:  Diagnosis Date  . Seizures ([facility])   . Stroke [facility])     Allergies No Known Allergies  IV Location/Drains/Wounds Patient Lines/Drains/Airways Status   Active Line/Drains/Airways    Name:   Placement date:   Placement time:   Site:   Days:   Peripheral IV 09/04/16 Right Antecubital  09/04/16    1850    Antecubital    1          Labs/Imaging Results for orders placed or performed during the hospital encounter of 09/04/16 (from the past 48 hour(s))  Urinalysis, Routine w reflex microscopic     Status: Abnormal   Collection Time: 09/04/16  5:51 PM  Result Value Ref Range   Color, Urine AMBER (A) YELLOW    Comment: BIOCHEMICALS MAY BE AFFECTED BY COLOR   APPearance HAZY (A) CLEAR   Specific Gravity, Urine 1.028 1.005 - 1.030   pH 5.0 5.0 - 8.0   Glucose, UA NEGATIVE NEGATIVE mg/dL   Hgb urine dipstick NEGATIVE NEGATIVE   Bilirubin Urine NEGATIVE NEGATIVE   Ketones, ur 20 (A) NEGATIVE mg/dL   Protein, ur NEGATIVE NEGATIVE mg/dL   Nitrite NEGATIVE NEGATIVE   Leukocytes, UA TRACE (A) NEGATIVE   RBC / HPF 0-5 0 - 5  RBC/hpf   WBC, UA 6-30 0 - 5 WBC/hpf   Bacteria, UA NONE SEEN NONE SEEN   Squamous Epithelial / LPF 0-5 (A) NONE SEEN   Mucous PRESENT    Granular Casts, UA PRESENT   CBC     Status: Abnormal   Collection Time: 09/04/16  6:52 PM  Result Value Ref Range   WBC 9.9 4.0 - 10.5 K/uL   RBC 3.89 (L) 4.22 - 5.81 MIL/uL   Hemoglobin 12.1 (L) 13.0 - 17.0 g/dL   HCT 36.3 (L) 39.0 - 52.0 %   MCV 93.3 78.0 - 100.0 fL   MCH 31.1 26.0 - 34.0 pg   MCHC 33.3 30.0 - 36.0 g/dL   RDW 15.0 11.5 - 15.5 %   Platelets 317 150 - 400 K/uL  Comprehensive metabolic panel     Status: Abnormal   Collection Time: 09/04/16  6:52 PM  Result Value Ref Range   Sodium 139 135 - 145 mmol/L   Potassium 4.6 3.5 - 5.1 mmol/L   Chloride 106 101 - 111 mmol/L   CO2 17 (L) 22 - 32 mmol/L   Glucose, Bld 94 65 - 99 mg/dL   BUN 33 (H) 6 - 20 mg/dL   Creatinine, Ser 1.30 (H) 0.61 - 1.24 mg/dL   Calcium 9.5 8.9 - 10.3 mg/dL   Total Protein 7.9 6.5 -  8.1 g/dL   Albumin 4.2 3.5 - 5.0 g/dL   AST 40 15 - 41 U/L   ALT 32 17 - 63 U/L   Alkaline Phosphatase 71 38 - 126 U/L   Total Bilirubin 4.9 (H) 0.3 - 1.2 mg/dL   GFR calc non Af Amer 52 (L) >60 mL/min   GFR calc Af Amer >60 >60 mL/min    Comment: (NOTE) The eGFR has been calculated using the CKD EPI equation. This calculation has not been validated in all clinical situations. eGFR's persistently <60 mL/min signify possible Chronic Kidney Disease.    Anion gap 16 (H) 5 - 15  Lipase, blood     Status: None   Collection Time: 09/04/16  6:52 PM  Result Value Ref Range   Lipase 34 11 - 51 U/L  I-Stat Troponin, ED (not at [facility])     Status: None   Collection Time: 09/04/16  6:54 PM  Result Value Ref Range   Troponin i, poc 0.02 0.00 - 0.08 ng/mL   Comment 3            Comment: Due to the release kinetics of cTnI, a negative result within the first hours of the onset of symptoms does not rule out myocardial infarction with certainty. If myocardial infarction is still  suspected, repeat the test at appropriate intervals.   I-Stat CG4 Lactic Acid, ED     Status: Abnormal   Collection Time: 09/04/16  6:56 PM  Result Value Ref Range   Lactic Acid, Venous 2.86 (HH) 0.5 - 1.9 mmol/L   Comment NOTIFIED PHYSICIAN   I-Stat CG4 Lactic Acid, ED     Status: Abnormal   Collection Time: 09/04/16  8:54 PM  Result Value Ref Range   Lactic Acid, Venous 2.20 (HH) 0.5 - 1.9 mmol/L   Comment NOTIFIED PHYSICIAN    Ct Head Wo Contrast  Result Date: 09/04/2016 CLINICAL DATA:  Generalized weakness EXAM: CT HEAD WITHOUT CONTRAST TECHNIQUE: Contiguous axial images were obtained from the base of the skull through the vertex without intravenous contrast. COMPARISON:  Brain MRI 05/10/2012 FINDINGS: Brain: No mass lesion, intraparenchymal hemorrhage or extra-axial collection. No evidence of acute cortical infarct. There is periventricular hypoattenuation compatible with chronic microvascular disease. Vascular: No hyperdense vessel or unexpected calcification. Skull: Normal visualized skull base, calvarium and extracranial soft tissues. Sinuses/Orbits: No sinus fluid levels or advanced mucosal thickening. No mastoid effusion. Normal orbits. IMPRESSION: Chronic microvascular ischemia without acute intracranial abnormality. Electronically Signed   By: Ulyses Jarred M.D.   On: 09/04/2016 20:42    Pending Labs [employer]    Start     Ordered   Signed and Held  CBC  (enoxaparin (LOVENOX)    CrCl >/= 30 ml/min)  Once,   R    Comments:  Baseline for enoxaparin therapy IF NOT ALREADY DRAWN.  Notify MD if PLT < 100 K.    Signed and Held   Signed and Held  Creatinine, serum  (enoxaparin (LOVENOX)    CrCl >/= 30 ml/min)  Once,   R    Comments:  Baseline for enoxaparin therapy IF NOT ALREADY DRAWN.    Signed and Held   Signed and Held  Creatinine, serum  (enoxaparin (LOVENOX)    CrCl >/= 30 ml/min)  Weekly,   R    Comments:  while on enoxaparin therapy    Signed and Held   Signed and  Held  Basic metabolic panel  Tomorrow morning,   R  Signed and Held   Signed and Held  CBC  Tomorrow morning,   R     Signed and Held   Signed and Held  CEA  Add-on,   R     Signed and Held   Signed and Held  PSA  Add-on,   R     Signed and Held   Signed and Held  AFP tumor marker  Add-on,   R     Signed and Held   Signed and Held  Cancer antigen 19-9  Add-on,   R     Signed and Held      Isolation Precautions No active isolations  Vitals/Pain Today's Vitals   09/04/16 2218 09/04/16 2341 09/05/16 0000 09/05/16 0030  BP:  (!) 105/56 107/69 109/74  Pulse:  98 97 94  Resp:  16    Temp: 97.8 F (36.6 C)     TempSrc:      SpO2:  100% 100% 100%  Weight:        Medications Medications  sodium chloride 0.9 % bolus 1,000 mL (0 mLs Intravenous Stopped 09/04/16 2047)    Mobility walks with device

## 2016-09-05 NOTE — Evaluation (Signed)
Physical Therapy Evaluation Patient Details Name: Kyle Dudley MRN: [id number] DOB: 08-08-1940 Today's Date: 09/05/2016   History of Present Illness  76 y.o. male with past medical history of CVA, remote history of seizure disorder and admitted for weakness and dysphagia  Clinical Impression  Pt admitted with above diagnosis. Pt currently with functional limitations due to the deficits listed below (see PT Problem List).  Pt will benefit from skilled PT to increase their independence and safety with mobility to allow discharge to the venue listed below.  Pt mobilizing slowly and fatigues quickly.  Recommend SNF at this time.     Follow Up Recommendations SNF;Supervision/Assistance - 24 hour    Equipment Recommendations  Rolling walker with 5" wheels    Recommendations for Other Services       Precautions / Restrictions Precautions Precautions: Fall      Mobility  Bed Mobility Overal bed mobility: Needs Assistance Bed Mobility: Rolling;Sidelying to Sit Rolling: Supervision Sidelying to sit: Supervision       General bed mobility comments: increased time and effort  Transfers Overall transfer level: Needs assistance Equipment used: Rolling walker (2 wheeled) Transfers: Sit to/from Stand Sit to Stand: Min assist         General transfer comment: assist to rise and steady, verbal cues for hand placement  Ambulation/Gait Ambulation/Gait assistance: Min assist Ambulation Distance (Feet): 13 Feet Assistive device: Rolling walker (2 wheeled) Gait Pattern/deviations: Step-through pattern;Decreased stride length;Narrow base of support;Trunk flexed Gait velocity: decr   General Gait Details: verbal cues for RW positioning and posture, fatigues quickly, ambulated to w/c (to go for MBS)  Stairs            Wheelchair Mobility    Modified Rankin (Stroke Patients Only)       Balance Overall balance assessment: Needs assistance         Standing balance support:  Bilateral upper extremity supported Standing balance-Leahy Scale: Poor Standing balance comment: requires UE support                             Pertinent Vitals/Pain Pain Assessment: No/denies pain    Home Living Family/patient expects to be discharged to:: Private residence Living Arrangements: Alone   Type of Home: Apartment Home Access: Level entry     Home Layout: One level Home Equipment: Cane - single point      Prior Function Level of Independence: Independent         Comments: reports spending more time in bed prior to arrival     Hand Dominance        Extremity/Trunk Assessment   Upper Extremity Assessment Upper Extremity Assessment: Generalized weakness    Lower Extremity Assessment Lower Extremity Assessment: Generalized weakness (lacking approx 8* of knee extension bilaterally during standing/ambulation)       Communication   Communication: No difficulties  Cognition Arousal/Alertness: Awake/alert Behavior During Therapy: Flat affect Overall Cognitive Status: Within Functional Limits for tasks assessed                                        General Comments      Exercises     Assessment/Plan    PT Assessment Patient needs continued PT services  PT Problem List Decreased strength;Decreased mobility;Decreased balance;Decreased knowledge of use of DME;Decreased activity tolerance       PT Treatment  Interventions DME instruction;Gait training;Therapeutic exercise;Therapeutic activities;Functional mobility training;Patient/family education    PT Goals (Current goals can be found in the Care Plan section)  Acute Rehab PT Goals PT Goal Formulation: With patient Time For Goal Achievement: 09/19/16 Potential to Achieve Goals: Good    Frequency Min 3X/week   Barriers to discharge        Co-evaluation               AM-PAC PT "6 Clicks" Daily Activity  Outcome Measure Difficulty turning over in bed  (including adjusting bedclothes, sheets and blankets)?: None Difficulty moving from lying on back to sitting on the side of the bed? : A Lot Difficulty sitting down on and standing up from a chair with arms (e.g., wheelchair, bedside commode, etc,.)?: Total Help needed moving to and from a bed to chair (including a wheelchair)?: A Little Help needed walking in hospital room?: A Little Help needed climbing 3-5 steps with a railing? : A Lot 6 Click Score: 15    End of Session Equipment Utilized During Treatment: Gait belt Activity Tolerance: Patient limited by fatigue Patient left: Other (comment);with nursing/sitter in room (in w/c with RN ) Nurse Communication: Mobility status PT Visit Diagnosis: Difficulty in walking, not elsewhere classified (R26.2);Muscle weakness (generalized) (M62.81)    Time: 4098-11911055-1104 PT Time Calculation (min) (ACUTE ONLY): 9 min   Charges:   PT Evaluation $PT Eval Low Complexity: 1 Low     PT G Codes:        Zenovia JarredKati Michiel Sivley, PT, DPT 09/05/2016 Pager: [phone]  Maida SaleLEMYRE,KATHrine E 09/05/2016, 12:33 PM

## 2016-09-05 NOTE — Evaluation (Signed)
Clinical/Bedside Swallow Evaluation Patient Details  Name: Kyle Dudley MRN: [id number] Date of Birth: May 05, 1940  Today's Date: 09/05/2016 Time: SLP Start Time (ACUTE ONLY): 1615 SLP Stop Time (ACUTE ONLY): 1635 SLP Time Calculation (min) (ACUTE ONLY): 20 min  Past Medical History:  Past Medical History:  Diagnosis Date  . Seizures ([facility])   . Stroke [facility](HCC)    Past Surgical History: History reviewed. No pertinent surgical history. HPI:  76 y.o. male with past medical history of CVA, remote history of seizure disorder and admitted for weakness and dysphagia.    Assessment / Plan / Recommendation Clinical Impression  Patient presents with a mild-mod oral and mild pharyngeal dysphagia characterized by impaired, prolonged mastication and oral transit of solid texture bolus and oral residuals post initial swallow with regular solids. Patient exhibited mild delay with oral transit of puree solids, but no oral residuals post swallow. Patient did not exhibit any overt s/s penetration or aspiration with thin liquids via straw, but suspect at least slight delay in swallow initiation with thin liquids. While patient was masticating regular solid texture, he appeared to chew lightly secondary to possible discomfort/pain with teeth, as they were in very poor condition.  SLP Visit Diagnosis: Dysphagia, oropharyngeal phase (R13.12)    Aspiration Risk  Mild aspiration risk    Diet Recommendation     Liquid Administration via: Cup;Straw Medication Administration: Whole meds with puree Supervision: Staff to assist with self feeding;Patient able to self feed;Intermittent supervision to cue for compensatory strategies Compensations: Minimize environmental distractions;Slow rate;Small sips/bites;Lingual sweep for clearance of pocketing Postural Changes: Seated upright at 90 degrees    Other  Recommendations Oral Care Recommendations: Oral care BID   Follow up Recommendations  (TBD pending progress)       Frequency and Duration min 2x/week  2 weeks       Prognosis Prognosis for Safe Diet Advancement: Good      Swallow Study   General Date of Onset: 09/04/16 HPI: 76 y.o. male with past medical history of CVA, remote history of seizure disorder and admitted for weakness and dysphagia.  Type of Study: Bedside Swallow Evaluation Previous Swallow Assessment: Esophagram today (8/11) which was unremarkable except for "tiny hiatal hernia" Diet Prior to this Study: Dysphagia 2 (chopped);Thin liquids Temperature Spikes Noted: No Respiratory Status: Room air History of Recent Intubation: No Behavior/Cognition: Alert;Cooperative;Pleasant mood;Lethargic/Drowsy Oral Care Completed by SLP: Yes Oral Cavity - Dentition: Poor condition;Missing dentition Vision: Functional for self-feeding Self-Feeding Abilities: Needs assist;Needs set up Patient Positioning: Upright in bed Baseline Vocal Quality: Low vocal intensity Volitional Cough: Weak Volitional Swallow: Unable to elicit    Oral/Motor/Sensory Function Overall Oral Motor/Sensory Function: Generalized oral weakness Facial ROM: Within Functional Limits Facial Symmetry: Within Functional Limits Lingual Strength: Reduced   Ice Chips     Thin Liquid Thin Liquid: Within functional limits Presentation: Cup;Straw Other Comments: No overt s/s of aspiration or penetration    Nectar Thick     Honey Thick     Puree Puree: Impaired Oral Phase Impairments: Reduced lingual movement/coordination Oral Phase Functional Implications: Prolonged oral transit Pharyngeal Phase Impairments: Suspected delayed Swallow   Solid   GO   Solid: Impaired Oral Phase Impairments: Impaired mastication;Reduced lingual movement/coordination Oral Phase Functional Implications: Prolonged oral transit;Oral residue Pharyngeal Phase Impairments: Suspected delayed Swallow        Angela NevinJohn T. Maeley Matton, MA, CCC-SLP 09/05/16 5:10 PM

## 2016-09-06 DIAGNOSIS — I63512 Cerebral infarction due to unspecified occlusion or stenosis of left middle cerebral artery: Secondary | ICD-10-CM

## 2016-09-06 LAB — CBC
HCT: 27.6 % — ABNORMAL LOW (ref 39.0–52.0)
Hemoglobin: 9.3 g/dL — ABNORMAL LOW (ref 13.0–17.0)
MCH: 31.7 pg (ref 26.0–34.0)
MCHC: 33.7 g/dL (ref 30.0–36.0)
MCV: 94.2 fL (ref 78.0–100.0)
PLATELETS: 241 10*3/uL (ref 150–400)
RBC: 2.93 MIL/uL — AB (ref 4.22–5.81)
RDW: 15.3 % (ref 11.5–15.5)
WBC: 5.4 10*3/uL (ref 4.0–10.5)

## 2016-09-06 LAB — COMPREHENSIVE METABOLIC PANEL
ALK PHOS: 53 U/L (ref 38–126)
ALT: 21 U/L (ref 17–63)
AST: 28 U/L (ref 15–41)
Albumin: 3.1 g/dL — ABNORMAL LOW (ref 3.5–5.0)
Anion gap: 9 (ref 5–15)
BUN: 16 mg/dL (ref 6–20)
CALCIUM: 8.2 mg/dL — AB (ref 8.9–10.3)
CHLORIDE: 103 mmol/L (ref 101–111)
CO2: 22 mmol/L (ref 22–32)
CREATININE: 0.72 mg/dL (ref 0.61–1.24)
Glucose, Bld: 90 mg/dL (ref 65–99)
Potassium: 3.5 mmol/L (ref 3.5–5.1)
Sodium: 134 mmol/L — ABNORMAL LOW (ref 135–145)
TOTAL PROTEIN: 5.8 g/dL — AB (ref 6.5–8.1)
Total Bilirubin: 3.3 mg/dL — ABNORMAL HIGH (ref 0.3–1.2)

## 2016-09-06 LAB — CULTURE, BLOOD (ROUTINE X 2)
CULTURE: NO GROWTH
CULTURE: NO GROWTH
SPECIAL REQUESTS: ADEQUATE
Special Requests: ADEQUATE

## 2016-09-06 NOTE — Progress Notes (Signed)
Initial Nutrition Assessment  DOCUMENTATION CODES:   Severe malnutrition in context of chronic illness, Underweight  INTERVENTION:   Continue Ensure Enlive po BID, each supplement provides 350 kcal and 20 grams of protein Encourage PO intake RD will continue to monitor for needs  NUTRITION DIAGNOSIS:   Malnutrition(severe) related to chronic illness, dysphagia (s/p stroke ) as evidenced by energy intake < or equal to 75% for > or equal to 1 month, severe depletion of body fat, severe depletion of muscle mass.  GOAL:   Patient will meet greater than or equal to 90% of their needs  MONITOR:   PO intake, Supplement acceptance, Labs, Weight trends, I & O's  REASON FOR ASSESSMENT:   Consult Assessment of nutrition requirement/status  ASSESSMENT:   76 y.o. male with past medical history of CVA, remote history of seizure disorder was brought into the hospital for further evaluation of weakness. She was complaining of difficulty swallowing. GI is consulted for further evaluation.  Patient with reports of dysphagia for 2 months now. Pt has been consuming mainly mayonnaise sandwiches. Pt has difficulty chewing d/t poor dentition and pain. Pt was evaluated by SLP, found mild-moderate signs of dysphagia. Per GI note, barium swallow showed no stricture or abnormalities.  Pt eating 25% of meals at this time. Primarily eating soups, jello and other fluids. Pt is drinking Ensure supplements, will continue order.  Per chart review, pt has gained weight. Nutrition-Focused physical exam completed. Findings are severe fat depletion, severe muscle depletion, and mild edema.   Medications reviewed. Labs reviewed: Low Na  Diet Order:  DIET SOFT Room service appropriate? Yes; Fluid consistency: Thin  Skin:  Reviewed, no issues  Last BM:  8/10  Height:   Ht Readings from Last 1 Encounters:  09/05/16 5\' 10"  (1.778 m)    Weight:   Wt Readings from Last 1 Encounters:  09/05/16 124 lb  (56.2 kg)    Ideal Body Weight:  75.5 kg  BMI:  Body mass index is 17.79 kg/m.  Estimated Nutritional Needs:   Kcal:  1550-1750  Protein:  70-80g  Fluid:  1.7L/day  EDUCATION NEEDS:   No education needs identified at this time  Tilda FrancoLindsey Daisy Mcneel, MS, RD, LDN Pager: [phone] After Hours Pager: [phone]

## 2016-09-06 NOTE — Evaluation (Signed)
Occupational Therapy Evaluation Patient Details Name: Kyle Dudley MRN: [id number] DOB: 1940-04-30 Today's Date: 09/06/2016    History of Present Illness 76 y.o. male with past medical history of CVA, remote history of seizure disorder and admitted for weakness and dysphagia   Clinical Impression   Pt reports feeling weak when up with OT this am and also complains of the back of both knees feeling tight/painful. Pt states discomfort eased off once pt in the chair to rest. Will continue to follow for acute OT as pt will benefit from OT to increase strength and independence with ADL.    Follow Up Recommendations  Supervision/Assistance - 24 hour;SNF    Equipment Recommendations  None recommended by OT (defer to next venue)    Recommendations for Other Services       Precautions / Restrictions Precautions Precautions: Fall Restrictions Weight Bearing Restrictions: No      Mobility Bed Mobility Overal bed mobility: Needs Assistance Bed Mobility: Rolling;Sidelying to Sit Rolling: Supervision Sidelying to sit: Min assist       General bed mobility comments: increased time.   Transfers Overall transfer level: Needs assistance Equipment used: Rolling walker (2 wheeled) Transfers: Sit to/from Stand Sit to Stand: Min assist         General transfer comment: cues for hand placement and assist to rise.    Balance Overall balance assessment: Needs assistance   Sitting balance-Leahy Scale: Fair     Standing balance support: Bilateral upper extremity supported Standing balance-Leahy Scale: Poor Standing balance comment: requires UE support                           ADL either performed or assessed with clinical judgement   ADL Overall ADL's : Needs assistance/impaired Eating/Feeding: Set up;Sitting   Grooming: Wash/dry face;Sitting;Set up   Upper Body Bathing: Set up;Supervision/ safety;Sitting   Lower Body Bathing: Moderate assistance;Sit to/from stand   Upper Body Dressing : Minimal assistance;Sitting   Lower Body Dressing: Moderate assistance;Sit to/from stand   Toilet Transfer: Minimal assistance;Ambulation;RW   Toileting- Clothing Manipulation and Hygiene: Moderate assistance;Sit to/from stand       Functional mobility during ADLs: Minimal assistance;Rolling walker;Cueing for safety General ADL Comments: Attempted to have patient transfer with walker into the bathroom but once pt close to bathroom door, pt complaining of the backs of his knees hurting and wanted to go to the chair. Pt performed grooming task once in chair. Pt states tightness/discomfort eased off once in chair. Pt's nephew present for session. Pt also reports that getting up to the chair was very tiring for him. Able to tolerate limited activity at this time.     Vision Patient Visual Report: No change from baseline       Perception     Praxis      Pertinent Vitals/Pain Pain Assessment: Faces Faces Pain Scale: Hurts a little bit Pain Location: back of knees Pain Descriptors / Indicators: Tightness Pain Intervention(s): Monitored during session;Other (comment) (stated improved upon resting)     Hand Dominance     Extremity/Trunk Assessment Upper Extremity Assessment Upper Extremity Assessment: Generalized weakness           Communication Communication Communication: No difficulties   Cognition Arousal/Alertness: Awake/alert Behavior During Therapy: Flat affect Overall Cognitive Status: Within Functional Limits for tasks assessed  General Comments       Exercises     Shoulder Instructions      Home Living Family/patient expects to be discharged to:: Private residence Living Arrangements: Alone   Type of Home: Apartment Home Access: Level entry     Home Layout: One level     Bathroom Shower/Tub: Chief Strategy OfficerTub/shower unit   Bathroom Toilet: Standard     Home Equipment: Cane - single  point;Shower seat          Prior Functioning/Environment Level of Independence: Independent        Comments: reports spending more time in bed prior to arrival        OT Problem List: Decreased strength;Decreased knowledge of use of DME or AE      OT Treatment/Interventions: Self-care/ADL training;Patient/family education;DME and/or AE instruction;Therapeutic activities    OT Goals(Current goals can be found in the care plan section) Acute Rehab OT Goals Patient Stated Goal: agreeable to get up with OT to get stronger OT Goal Formulation: With patient Time For Goal Achievement: 09/20/16 Potential to Achieve Goals: Good  OT Frequency: Min 2X/week   Barriers to D/C:            Co-evaluation              AM-PAC PT "6 Clicks" Daily Activity     Outcome Measure Help from another person eating meals?: A Little Help from another person taking care of personal grooming?: A Little Help from another person toileting, which includes using toliet, bedpan, or urinal?: A Lot Help from another person bathing (including washing, rinsing, drying)?: A Lot Help from another person to put on and taking off regular upper body clothing?: A Little Help from another person to put on and taking off regular lower body clothing?: A Lot 6 Click Score: 15   End of Session Equipment Utilized During Treatment: Rolling walker  Activity Tolerance: Patient limited by fatigue;Patient limited by pain Patient left: in chair;with call bell/phone within reach;with chair alarm set  OT Visit Diagnosis: Unsteadiness on feet (R26.81);Muscle weakness (generalized) (M62.81)                Time: 6213-08650858-0922 OT Time Calculation (min): 24 min Charges:  OT General Charges $OT Visit: 1 Procedure OT Evaluation $OT Eval Low Complexity: 1 Procedure OT Treatments $Therapeutic Activity: 8-22 mins G-Codes:       Zannie KehrStephanie S Suzanna Zahn 09/06/2016, 11:02 AM

## 2016-09-06 NOTE — Progress Notes (Signed)
[facility] Progress Note  Kyle FusiLeo Dudley 76 y.o. 1940-09-30  CC:  dysphagia   Subjective: no significant change in since yesterday. Nephew at Bedside.patient denied abdominal pain, nausea or vomiting.  ROS : negative for chest pain and shortness of breath.   Objective: Vital signs in last 24 hours: Vitals:   09/05/16 1407 09/05/16 2008  BP: 120/65 (!) 101/57  Pulse: 96 85  Resp: 16 14  Temp: 98.3 F (36.8 C) 98 F (36.7 C)  SpO2: 100% 100%    Physical Exam:  Constitutional: He is oriented to person, place, and time. No distress.  Thin-appearing elderly patient  HENT:  Head: Normocephalic and atraumatic.  Mouth/Throat: No oropharyngeal exudate.  Poor dentition  Eyes: EOM are normal.  Neck: Normal range of motion. Neck supple. No thyromegaly present.  Cardiovascular: Normal rate, regular rhythm and normal heart sounds.   Pulmonary/Chest: Effort normal and breath sounds normal. No respiratory distress.  Abdominal: Soft. Bowel sounds are normal. He exhibits no distension. There is no tenderness. There is no guarding.  Musculoskeletal: He exhibits no edema or tenderness.  Neurological: He is alert and oriented to person, place, and time.  Skin: Skin is warm. No erythema.  Psychiatric: He has a normal mood and affect. His behavior is normal. Thought content normal.     Lab Results:  Recent Labs  09/05/16 0535 09/06/16 0548  NA 139 134*  K 4.1 3.5  CL 110 103  CO2 18* 22  GLUCOSE 91 90  BUN 24* 16  CREATININE 0.95 0.72  CALCIUM 8.4* 8.2*    Recent Labs  09/04/16 1852 09/05/16 0951 09/06/16 0548  AST 40  --  28  ALT 32  --  21  ALKPHOS 71  --  53  BILITOT 4.9* 4.2* 3.3*  PROT 7.9  --  5.8*  ALBUMIN 4.2  --  3.1*    Recent Labs  09/05/16 0535 09/06/16 0548  WBC 6.4 5.4  HGB 9.3* 9.3*  HCT 28.4* 27.6*  MCV 92.2 94.2  PLT 296 241   No results for input(s): LABPROT, INR in the last 72 hours.    Assessment/Plan: - Dysphagia to solid  foods since last 2 months.barium swallow negative for stricture or mass. - 9 pound weight loss over 1 year - Anemia. No overt bleeding.most likely from vitamin B12 deficiency. - vitamin B12 deficiency.Management per primary team - History of CVA and history of seizure disorder - indirect hyperbilirubinemia - Poor dentition  Recommendations -------------------------  - barium swallow yesterday showed no evidence of stricture or mucosal abnormality. Speech evaluation revealed oropharyngeal dysphagia. - hemoglobin stable. - No  plan for inpatient GI workup.GI will sign off. Call us back if needed. - Follow-up in GI clinic in 3 to 4 weeks after discharge   Kathi DerParag Belva Koziel MD, FACP 09/06/2016, 10:59 AM  Pager [phone]  If no answer or after 5 PM call [phone]

## 2016-09-06 NOTE — Progress Notes (Addendum)
PROGRESS NOTE    Kyle Dudley  WUJ:[id number] DOB: 26-Apr-1940 DOA: 09/04/2016 PCP: Macy MisBriscoe, Kim K, MD  Brief Narrative: 76/M with h/o CVA and Remote Seizures weakness, weight loss and Dysphagia for 2months, unable to eat anything but soft foods, diet now limited to mayonnaise sandwiches   Assessment & Plan:    Dysphagia -multifactorial, suspect dental issues contributing  -s/p Esophagram: Limited study without definitive evidence of esophageal dysmotility, stricture or mucosal abnormality. -Status post SLP evaluation, suspected to have oropharyngeal dysphagia, dental issues -Appreciate GI consult, plan for EGD as outpatient -Advance to soft diet -Discuss with patient and nephew about need to see a dentist  H/o  left MCA stroke (HCC) -continue ASA  Seizure disorder (HCC) -stable, continue Keppra   Severe protein calorie Malnutrition -RD consult, ensure   Debility/deconditioning and weakness -PT consult obtained, SNF recommended -Pt agreeable to consider this, will consult social work well for short-term rehabilitation  DVT prophylaxis:lovenox Code Status: Full Code Family Communication: None at bedside Disposition Plan: Home pending workup   [facility]    Subjective: C/o difficulty swallowing, weakness overall  Objective: Vitals:   09/05/16 0123 09/05/16 0554 09/05/16 1407 09/05/16 2008  BP: (!) 146/78 (!) 122/56 120/65 (!) 101/57  Pulse: (!) 105 95 96 85  Resp: 16 16 16 14   Temp: 98.9 F (37.2 C) 98.5 F (36.9 C) 98.3 F (36.8 C) 98 F (36.7 C)  TempSrc: Oral Oral Oral Oral  SpO2: 100% 100% 100% 100%  Weight: 56.2 kg (124 lb)     Height: 5\' 10"  (1.778 m)       Intake/Output Summary (Last 24 hours) at 09/06/16 1112 Last data filed at 09/06/16 [id number]  Gross per 24 hour  Intake              617 ml  Output              700 ml  Net              -83 ml   Filed Weights   09/04/16 1713 09/05/16 0123  Weight: 56.7 kg (125 lb) 56.2 kg (124 lb)     Examination:  Gen: Frail elderly African-American male, cachectic, no distress HEENT: PERRLA, Neck supple, no JVD Lungs: Good air movement bilaterally, CTAB CVS: RRR,No Gallops,Rubs or new Murmurs Abd: soft, Non tender, non distended, BS present Extremities: No Cyanosis, Clubbing or edema Skin: no new rashes  Data Reviewed:   CBC:  Recent Labs Lab 09/01/16 2025 09/04/16 1852 09/05/16 0535 09/06/16 0548  WBC 8.3 9.9 6.4 5.4  NEUTROABS 6.6  --   --   --   HGB 12.4* 12.1* 9.3* 9.3*  HCT 38.4* 36.3* 28.4* 27.6*  MCV 92.8 93.3 92.2 94.2  PLT 371 317 296 241   Basic Metabolic Panel:  Recent Labs Lab 09/01/16 2025 09/04/16 1852 09/05/16 0535 09/06/16 0548  NA 133* 139 139 134*  K 4.9 4.6 4.1 3.5  CL 101 106 110 103  CO2 13* 17* 18* 22  GLUCOSE 83 94 91 90  BUN 45* 33* 24* 16  CREATININE 1.50* 1.30* 0.95 0.72  CALCIUM 9.3 9.5 8.4* 8.2*   GFR: Estimated Creatinine Clearance: 63.4 mL/min (by C-G formula based on SCr of 0.72 mg/dL). Liver Function Tests:  Recent Labs Lab 09/01/16 2025 09/04/16 1852 09/05/16 0951 09/06/16 0548  AST 38 40  --  28  ALT 29 32  --  21  ALKPHOS 72 71  --  53  BILITOT 3.9* 4.9*  4.2* 3.3*  PROT 7.2 7.9  --  5.8*  ALBUMIN 3.6 4.2  --  3.1*    Recent Labs Lab 09/04/16 1852  LIPASE 34   No results for input(s): AMMONIA in the last 168 hours. Coagulation Profile: No results for input(s): INR, PROTIME in the last 168 hours. Cardiac Enzymes: No results for input(s): CKTOTAL, CKMB, CKMBINDEX, TROPONINI in the last 168 hours. BNP (last 3 results) No results for input(s): PROBNP in the last 8760 hours. HbA1C: No results for input(s): HGBA1C in the last 72 hours. CBG: No results for input(s): GLUCAP in the last 168 hours. Lipid Profile: No results for input(s): CHOL, HDL, LDLCALC, TRIG, CHOLHDL, LDLDIRECT in the last 72 hours. Thyroid Function Tests:  Recent Labs  09/05/16 0941  TSH 3.322   Anemia Panel:  Recent  Labs  09/05/16 0941  VITAMINB12 169*  FOLATE 4.0*  FERRITIN 95  TIBC 356  IRON 58  RETICCTPCT 5.5*   Urine analysis:    Component Value Date/Time   COLORURINE AMBER (A) 09/04/2016 1751   APPEARANCEUR HAZY (A) 09/04/2016 1751   LABSPEC 1.028 09/04/2016 1751   PHURINE 5.0 09/04/2016 1751   GLUCOSEU NEGATIVE 09/04/2016 1751   HGBUR NEGATIVE 09/04/2016 1751   BILIRUBINUR NEGATIVE 09/04/2016 1751   KETONESUR 20 (A) 09/04/2016 1751   PROTEINUR NEGATIVE 09/04/2016 1751   UROBILINOGEN 1.0 05/10/2012 2032   NITRITE NEGATIVE 09/04/2016 1751   LEUKOCYTESUR TRACE (A) 09/04/2016 1751   Sepsis Labs: @LABRCNTIP (procalcitonin:4,lacticidven:4)  ) Recent Results (from the past 240 hour(s))  Blood Culture (routine x 2)     Status: None (Preliminary result)   Collection Time: 09/01/16  8:17 PM  Result Value Ref Range Status   Specimen Description BLOOD RIGHT ANTECUBITAL  Final   Special Requests   Final    BOTTLES DRAWN AEROBIC AND ANAEROBIC Blood Culture adequate volume   Culture NO GROWTH 4 DAYS  Final   Report Status PENDING  Incomplete  Blood Culture (routine x 2)     Status: None (Preliminary result)   Collection Time: 09/01/16  8:25 PM  Result Value Ref Range Status   Specimen Description BLOOD LEFT ANTECUBITAL  Final   Special Requests IN PEDIATRIC BOTTLE Blood Culture adequate volume  Final   Culture NO GROWTH 4 DAYS  Final   Report Status PENDING  Incomplete  Urine culture     Status: Abnormal   Collection Time: 09/01/16  9:40 PM  Result Value Ref Range Status   Specimen Description URINE, CLEAN CATCH  Final   Special Requests NONE  Final   Culture MULTIPLE SPECIES PRESENT, SUGGEST RECOLLECTION (A)  Final   Report Status 09/03/2016 FINAL  Final         Radiology Studies: Ct Head Wo Contrast  Result Date: 09/04/2016 CLINICAL DATA:  Generalized weakness EXAM: CT HEAD WITHOUT CONTRAST TECHNIQUE: Contiguous axial images were obtained from the base of the skull through  the vertex without intravenous contrast. COMPARISON:  Brain MRI 05/10/2012 FINDINGS: Brain: No mass lesion, intraparenchymal hemorrhage or extra-axial collection. No evidence of acute cortical infarct. There is periventricular hypoattenuation compatible with chronic microvascular disease. Vascular: No hyperdense vessel or unexpected calcification. Skull: Normal visualized skull base, calvarium and extracranial soft tissues. Sinuses/Orbits: No sinus fluid levels or advanced mucosal thickening. No mastoid effusion. Normal orbits. IMPRESSION: Chronic microvascular ischemia without acute intracranial abnormality. Electronically Signed   By: Deatra Robinson M.D.   On: 09/04/2016 20:42   Dg Esophagus  Result Date: 09/05/2016 CLINICAL DATA:  Dysphagia with solid use for the past 2 months. Unintended weight loss of 8 pounds during the past year. EXAM: ESOPHOGRAM/BARIUM SWALLOW TECHNIQUE: Single contrast examination was performed using  thin barium. FLUOROSCOPY TIME:  1 minute, 48 seconds (11.1 mGy) COMPARISON:  Chest CT - 09/01/2016 FINDINGS: Examination is degraded due to patient's limited mobility. Initial swallowing images are negative for evidence of aspiration. There is brisk passage of contrast through the esophagus without evidence of dysmotility or achalasia. No discrete mucosal abnormalities. There is brisk emptying of the esophagus into the stomach. Suspected tiny hiatal hernia. There is brisk emptying of the stomach with opacification of the proximal small bowel. IMPRESSION: Limited though unremarkable barium swallow without definitive evidence of esophageal dysmotility, stricture or mucosal abnormality. Electronically Signed   By: Simonne Come M.D.   On: 09/05/2016 12:05        Scheduled Meds: . aspirin  81 mg Oral Daily  . enoxaparin (LOVENOX) injection  40 mg Subcutaneous Q24H  . feeding supplement (ENSURE ENLIVE)  237 mL Oral BID BM  . levETIRAcetam  1,000 mg Oral QHS  . mouth rinse  15 mL Mouth  Rinse BID   Continuous Infusions:   LOS: 1 day    Time spent:    Zannie Cove, MD [facility] Pager [phone]  If 7PM-7AM, please contact night-coverage [email or web address] Password [email or web address] 09/06/2016, 11:12 AM

## 2016-09-07 DIAGNOSIS — G40909 Epilepsy, unspecified, not intractable, without status epilepticus: Secondary | ICD-10-CM

## 2016-09-07 LAB — AFP TUMOR MARKER: AFP, Serum, Tumor Marker: 1.8 ng/mL (ref 0.0–8.3)

## 2016-09-07 LAB — CANCER ANTIGEN 19-9: CAN 19-9: 1 U/mL (ref 0–35)

## 2016-09-07 MED ORDER — LEVETIRACETAM ER 500 MG PO TB24
1000.0000 mg | ORAL_TABLET | Freq: Every day | ORAL | Status: DC
Start: 2016-09-07 — End: 2016-11-16

## 2016-09-07 MED ORDER — ENSURE ENLIVE PO LIQD: 237.0000 mL | Freq: Two times a day (BID) | ORAL | Status: DC

## 2016-09-07 NOTE — NC FL2 (Signed)
[place] MEDICAID FL2 LEVEL OF CARE SCREENING TOOL     IDENTIFICATION  Patient Name: Kyle Dudley Birthdate: [birth year] Sex: male Admission Date (Current Location): 09/04/2016  [facility] [place]   Facility and Address:  [facility],  [address], [place] [id number]      Provider Number: [id number]  Attending Physician Name and Address:  Zannie CoveJoseph, Preetha, MD  Relative Name and Phone Number:       Current Level of Care: Hospital Recommended Level of Care: Skilled Nursing Facility Prior Approval Number:    Date Approved/Denied:   PASRR Number:   [id number]  Discharge Plan: SNF    Current Diagnoses: Patient Active Problem List   Diagnosis Date Noted  . Dysphagia 09/05/2016  . Weight loss 09/05/2016  . Weakness 09/05/2016  . Unspecified essential hypertension 05/13/2012  . Acute ischemic left MCA stroke (HCC) 05/11/2012  . Seizure disorder (HCC) 05/11/2012    Orientation RESPIRATION BLADDER Height & Weight     Self, Time, Situation, Place  Normal Continent Weight: 124 lb (56.2 kg) Height:  5\' 10"  (177.8 cm)  BEHAVIORAL SYMPTOMS/MOOD NEUROLOGICAL BOWEL NUTRITION STATUS      Continent Diet (Dysphagia (Soft diet))   Continue Ensure Enlive po BID, each supplement provides 350 kcal and 20 grams of protein Liquid Administration via: Cup;Straw Medication Administration: Whole meds with puree Supervision: Staff to assist with self feeding;Patient able to self feed;Intermittent supervision to cue for compensatory strategies Compensations: Minimize environmental distractions;Slow rate;Small sips/bites;Lingual sweep for clearance of pocketing Postural Changes: Seated upright at 90 degrees   AMBULATORY STATUS COMMUNICATION OF NEEDS Skin   Extensive Assist Verbally Normal                       Personal Care Assistance Level of Assistance  Bathing, Feeding, Dressing Bathing Assistance: Limited assistance Feeding assistance:  Independent Dressing Assistance: Limited assistance     Functional Limitations Info  Sight, Hearing, Speech Sight Info: Adequate Hearing Info: Adequate Speech Info: Adequate    SPECIAL CARE FACTORS FREQUENCY  PT (By licensed PT), OT (By licensed OT), Speech therapy     PT Frequency: 5x OT Frequency: 5x     Speech Therapy Frequency: 2x      Contractures Contractures Info: Not present    Additional Factors Info  Code Status, Allergies Code Status Info: Full Code Allergies Info: NKA           Current Medications (09/07/2016):  This is the current hospital active medication list Current Facility-Administered Medications  Medication Dose Route Frequency Provider Last Rate Last Dose  . aspirin chewable tablet 81 mg  81 mg Oral Daily Crosley, Debby, MD   81 mg at 09/07/16 0850  . enoxaparin (LOVENOX) injection 40 mg  40 mg Subcutaneous Q24H Crosley, Debby, MD   40 mg at 09/07/16 0850  . feeding supplement (ENSURE ENLIVE) (ENSURE ENLIVE) liquid 237 mL  237 mL Oral BID BM Keturah Barrerossley, James J, MD   237 mL at 09/07/16 0850  . levETIRAcetam (KEPPRA XR) 24 hr tablet 1,000 mg  1,000 mg Oral QHS Zannie CoveJoseph, Preetha, MD   1,000 mg at 09/06/16 2219  . MEDLINE mouth rinse  15 mL Mouth Rinse BID Zannie CoveJoseph, Preetha, MD   15 mL at 09/07/16 [id number]     Discharge Medications: Please see discharge summary for a list of discharge medications.  Relevant Imaging Results:  Relevant Lab Results:   Additional Information SSN:  [id number]  Raye SorrowCoble, Vincent Ehrler N,  LCSW

## 2016-09-07 NOTE — Progress Notes (Signed)
Plan for d/c to SNF, discharge planning per CSW. [phone] 

## 2016-09-07 NOTE — Clinical Social Work Placement (Signed)
   CLINICAL SOCIAL WORK PLACEMENT  NOTE  Date:  09/07/2016  Patient Details  Name: Kyle Dudley MRN: [id number] Date of Birth: [birth year]  Clinical Social Work is seeking post-discharge placement for this patient at the Skilled  Nursing Facility level of care (*CSW will initial, date and re-position this form in  chart as items are completed):  Yes   Patient/family provided with [facility] Clinical Social Work Department's list of facilities offering this level of care within the geographic area requested by the patient (or if unable, by the patient's family).  Yes   Patient/family informed of their freedom to choose among providers that offer the needed level of care, that participate in Medicare, Medicaid or managed care program needed by the patient, have an available bed and are willing to accept the patient.  Yes   Patient/family informed of [facility]'s ownership interest in [facility] and [facility], as well as of the fact that they are under no obligation to receive care at these facilities.  PASRR submitted to EDS on 09/07/16     PASRR number received on 09/07/16     Existing PASRR number confirmed on       FL2 transmitted to all facilities in geographic area requested by pt/family on 09/07/16     FL2 transmitted to all facilities within larger geographic area on       Patient informed that his/her managed care company has contracts with or will negotiate with certain facilities, including the following:        Yes   Patient/family informed of bed offers received.  [facility]     Physician recommends and patient [facility]    [facility].  Patient to be transferred to facility by EMS     Patient family notified on 09/07/16 of transfer.  Name of family member notified:  Moishe Spiceiffany, Neice     PHYSICIAN Please sign FL2      Additional Comment:    _______________________________________________ Raye Sorrowoble, Jalyah Weinheimer N, LCSW 09/07/2016, 12:25 PM

## 2016-09-07 NOTE — Clinical Social Work Note (Signed)
Clinical Social Work Assessment  Patient Details  Name: Kyle Dudley MRN: [id number] Date of Birth: April 26, 1940  Date of referral:  09/07/16               Reason for consult:  Facility Placement, Discharge Planning                Permission sought to share information with:  Case Manager, Magazine features editoracility Contact Representative, Family Supports Permission granted to share information::  Yes, Verbal Permission Granted  Name::        Agency::     Relationship::  Kyle Dudley  Contact Information:     Housing/Transportation Living arrangements for the past 2 months:  Apartment ([employer] (community, independent living)) Source of Information:  Patient, Development worker, communityMedical Team, Other (Comment Required), Case Manager (neice) Patient Interpreter Needed:  None Criminal Activity/Legal Involvement Pertinent to Current Situation/Hospitalization:  No - Comment as needed Significant Relationships:  Other Family Members, [employer] Support, Friend Lives with:  Self Do you feel safe going back to the place where you live?  No Need for family participation in patient care:  Yes (Comment)  Care giving concerns:  Patient admitted to hospital for progressive weakness and dysphagia.  Patient seen at bedside who was resting with head under covers.  He did engage but was apprehensive and nervous.  Patient reports he lives alone at home and has a niece who checks on him.  He reports she works most days so he will rely on her when available or take a cab if he needs to go somewhere.  Call placed to niece to obtain more information .  Niece reports she [place] in [place], but works at [employer] [place] and travels 3 hours each day to and from work.  Niece reports she has been concnered with patient because he has not been eating consistently and has lost weight.  Reports she noticed a change in his mood after her mother (his oldest sister passed away 3 years ago) and after his stroke.  She reports he has a daughter but they do  not speak reguarly and she has leukemia and going through treatments.  Reports patient has a hard time trusting people and does not accept help very openly, but lives in a community where he can call for help or his neighbors will check in on him.  Niece provides assistance with paying bills and getting him to the doctor and maintaing his apartment, but he has required more car in the last couple of weeks and she feels and agrees with recommendation for short term rehab.   Social Worker assessment / plan:  Assessment completed and consult for facility placement. Patient agreeable for only short term rehab as he was not happy to hear the term: nursing home in discussion.  LCSW explained reason for recommendation and short vs long term. Patient amendable.  LCSW completed SNF work up. Will review bed offers once available for placement.  Niece aware of situation and agreeable. Would need EMS for transport at DC. No preference in facility other than remaining in [place].  Employment status:  Retired Health and safety inspectornsurance information:  Armed forces operational officerMedicare, Medicaid [place] Recommendations:  Skilled Nursing Facility Information / Referral to community resources:  Skilled Nursing Facility  Patient/Family's Response to care:  Guarded during assessment, but agreeable to disposition and recommendaitons  Patient/Family's Understanding of and Emotional Response to Diagnosis, Current Treatment, and Prognosis:  Niece voices concerns with lack of eating and poor nutrition.  Understands plans for SNF  and agreeable.   Emotional Assessment Appearance:  Appears stated age Attitude/Demeanor/Rapport:    Affect (typically observed):  Apprehensive, Anxious, Pleasant Orientation:  Oriented to Self, Oriented to Place, Oriented to  Time, Oriented to Situation Alcohol / Substance use:  Not Applicable Psych involvement (Current and /or in the community):  No (Comment)  Discharge Needs  Concerns to be addressed:  Denies  Needs/Concerns at this time Readmission within the last 30 days:  No Current discharge risk:  None Barriers to Discharge:  No Barriers Identified, Continued Medical Work up   Kyle Sorrow, LCSW 09/07/2016, 10:29 AM

## 2016-09-07 NOTE — Discharge Summary (Signed)
Physician Discharge Summary  Kyle Dudley ZOX:[id number] DOB: [birth year] DOA: 09/04/2016  PCP: Macy Mis, MD  Admit date: 09/04/2016 Discharge date: 09/07/2016  Time spent:35 minutes  Recommendations for Outpatient Follow-up:  1. PCP Dr.Briscoe in 1 week, please ensure Dental Follow up 2. Non urgent FU with Dr.Brahmbhatt [facility] gastroenterology) in 1 month for elective EGD   Discharge Diagnoses:  Principal Problem:   Dysphagia   Severe protein calorie malnutrition   Acute ischemic left MCA stroke (HCC)   Seizure disorder (HCC)   Weight loss   Weakness   Discharge Condition: stable  Diet recommendation: soft diet  Filed Weights   09/04/16 1713 09/05/16 0123  Weight: 56.7 kg (125 lb) 56.2 kg (124 lb)    History of present illness:  76/M with h/o CVA and Remote Seizures weakness, weight loss and Dysphagia for 2months, unable to eat anything but soft foods, diet now limited to mayonnaise sandwiches  Hospital Course:   Dysphagia -multifactorial, suspect dental issues contributing  -s/p Esophagram: Limited study without definitive evidence of esophageal dysmotility, stricture or mucosal abnormality. -Status post SLP evaluation, suspected to have oropharyngeal dysphagia, dental issues -tolerating a soft diet without complaints and drinking Ensure  -[facility] Dr.Brahmbhatt consulted, plan for EGD as outpatient -Discussed with patient and nephew about need to see a dentist after discharge  H/o  left MCA stroke (HCC) in 2013 -continue ASA  Seizure disorder (HCC) -stable, continue Keppra 100mg  QHS  Severe protein calorie Malnutrition -RD consult, ensure BID added  Debility/deconditioning and weakness -PT consult obtained, SNF recommended -Pt agreeable to consider short-term rehabilitation   Consultations:  [facility]  Discharge Exam: Vitals:   09/06/16 1957 09/07/16 0654  BP: (!) 97/45 (!) 97/50  Pulse: 85 79  Resp: 16 16  Temp: 98 F (36.7 C)  98.2 F (36.8 C)  SpO2: 100% 100%    General: AAOx3 Cardiovascular: S1S2/RRR Respiratory: CTAB  Discharge Instructions   Discharge Instructions    Discharge instructions    Complete by:  As directed    Soft diet   Increase activity slowly    Complete by:  As directed      Current Discharge Medication List    START taking these medications   Details  feeding supplement, ENSURE ENLIVE, (ENSURE ENLIVE) LIQD Take 237 mLs by mouth 2 (two) times daily between meals.      CONTINUE these medications which have CHANGED   Details  levETIRAcetam (KEPPRA XR) 500 MG 24 hr tablet Take 2 tablets (1,000 mg total) by mouth at bedtime.      CONTINUE these medications which have NOT CHANGED   Details  aspirin 81 MG chewable tablet Chew 1 tablet (81 mg total) by mouth daily.      STOP taking these medications     potassium chloride SA (K-DUR,KLOR-CON) 20 MEQ tablet        No Known Allergies Contact information for after-discharge care    Destination    [facility] .   Specialty:  Skilled Nursing Facility Contact information: [address] [place] [address] [phone]               The results of significant diagnostics from this hospitalization (including imaging, microbiology, ancillary and laboratory) are listed below for reference.    Significant Diagnostic Studies: Ct Head Wo Contrast  Result Date: 09/04/2016 CLINICAL DATA:  Generalized weakness EXAM: CT HEAD WITHOUT CONTRAST TECHNIQUE: Contiguous axial images were obtained from the base of  the skull through the vertex without intravenous contrast. COMPARISON:  Brain MRI 05/10/2012 FINDINGS: Brain: No mass lesion, intraparenchymal hemorrhage or extra-axial collection. No evidence of acute cortical infarct. There is periventricular hypoattenuation compatible with chronic microvascular disease. Vascular: No hyperdense vessel or unexpected calcification. Skull: Normal  visualized skull base, calvarium and extracranial soft tissues. Sinuses/Orbits: No sinus fluid levels or advanced mucosal thickening. No mastoid effusion. Normal orbits. IMPRESSION: Chronic microvascular ischemia without acute intracranial abnormality. Electronically Signed   By: Deatra Robinson M.D.   On: 09/04/2016 20:42   Ct Angio Chest Pe W/cm &/or Wo Cm  Result Date: 09/01/2016 CLINICAL DATA:  Acute onset of elevated D-dimer and shortness of breath. Initial encounter. EXAM: CT ANGIOGRAPHY CHEST WITH CONTRAST TECHNIQUE: Multidetector CT imaging of the chest was performed using the standard protocol during bolus administration of intravenous contrast. Multiplanar CT image reconstructions and MIPs were obtained to evaluate the vascular anatomy. CONTRAST:  80 mL of Isovue 370 IV contrast COMPARISON:  Chest radiograph performed earlier today at 6:48 p.m. FINDINGS: Cardiovascular:  There is no evidence of pulmonary embolus. Scattered coronary artery calcifications are seen. The heart is normal in size. Scattered calcification is noted along the aortic arch. Mild mural thrombus is seen along the descending thoracic aorta, without significant luminal narrowing. Mild mural thrombus is also noted along the proximal left subclavian artery, without significant luminal narrowing. Mediastinum/Nodes: The mediastinum is unremarkable in appearance. No mediastinal lymphadenopathy is seen. No pericardial effusion is identified. The visualized portions of the thyroid gland are unremarkable. No axillary lymphadenopathy is seen. Lungs/Pleura: Scattered peripheral blebs are noted bilaterally, reflecting mild emphysema. Mild atelectasis is noted at the left lung base. No pleural effusion or pneumothorax is seen. No masses are identified. Upper Abdomen: The visualized portions of the liver and spleen are grossly unremarkable. The visualized portions of the gallbladder, adrenal glands and left kidney are within normal limits.  Musculoskeletal: No acute osseous abnormalities are identified. The visualized musculature is unremarkable in appearance. Review of the MIP images confirms the above findings. IMPRESSION: 1. No evidence of pulmonary embolus. 2. Scattered peripheral blebs noted bilaterally, reflecting mild emphysema. Mild atelectasis at the left lung base. 3. Scattered coronary artery calcifications. Electronically Signed   By: Roanna Raider M.D.   On: 09/01/2016 23:42   Dg Esophagus  Result Date: 09/05/2016 CLINICAL DATA:  Dysphagia with solid use for the past 2 months. Unintended weight loss of 8 pounds during the past year. EXAM: ESOPHOGRAM/BARIUM SWALLOW TECHNIQUE: Single contrast examination was performed using  thin barium. FLUOROSCOPY TIME:  1 minute, 48 seconds (11.1 mGy) COMPARISON:  Chest CT - 09/01/2016 FINDINGS: Examination is degraded due to patient's limited mobility. Initial swallowing images are negative for evidence of aspiration. There is brisk passage of contrast through the esophagus without evidence of dysmotility or achalasia. No discrete mucosal abnormalities. There is brisk emptying of the esophagus into the stomach. Suspected tiny hiatal hernia. There is brisk emptying of the stomach with opacification of the proximal small bowel. IMPRESSION: Limited though unremarkable barium swallow without definitive evidence of esophageal dysmotility, stricture or mucosal abnormality. Electronically Signed   By: Simonne Come M.D.   On: 09/05/2016 12:05   Dg Chest Port 1 View  Result Date: 09/01/2016 CLINICAL DATA:  Weakness EXAM: PORTABLE CHEST 1 VIEW COMPARISON:  May 10, 2012 FINDINGS: There is no edema or consolidation. There is left apical pleural calcification. Heart size and pulmonary vascularity are normal. No adenopathy. There is aortic atherosclerosis. No evident bone lesions.  IMPRESSION: Left apical pleural calcification, likely due to previous inflammatory focus in this area. No edema or consolidation.  There is aortic atherosclerosis. Aortic Atherosclerosis (ICD10-I70.0). Electronically Signed   By: Bretta BangWilliam  Woodruff III M.D.   On: 09/01/2016 19:08   Dg Knee Complete 4 Views Left  Result Date: 08/18/2016 CLINICAL DATA:  Posterior knee pain with bending EXAM: LEFT KNEE - COMPLETE 4+ VIEW COMPARISON:  None. FINDINGS: No fracture or dislocation is evident. No large joint effusion. Minimal degenerative change of the medial compartment. Joint spaces are relatively maintained. Vascular calcifications. IMPRESSION: Negative. Electronically Signed   By: Jasmine PangKim  Fujinaga M.D.   On: 08/18/2016 14:16   Dg Knee Complete 4 Views Right  Result Date: 08/18/2016 CLINICAL DATA:  Bilateral posterior knee pain with flexion. No known injuries. EXAM: RIGHT KNEE - COMPLETE 4+ VIEW COMPARISON:  None. FINDINGS: No evidence of acute fracture or dislocation. Well-preserved joint spaces. Well-preserved bone mineral density. No intrinsic osseous abnormality. No visible joint effusion. Femoropopliteal and tibioperoneal artery atherosclerosis. IMPRESSION: Normal right knee. Electronically Signed   By: Hulan Saashomas  Lawrence M.D.   On: 08/18/2016 14:17    Microbiology: Recent Results (from the past 240 hour(s))  Blood Culture (routine x 2)     Status: None   Collection Time: 09/01/16  8:17 PM  Result Value Ref Range Status   Specimen Description BLOOD RIGHT ANTECUBITAL  Final   Special Requests   Final    BOTTLES DRAWN AEROBIC AND ANAEROBIC Blood Culture adequate volume   Culture NO GROWTH 5 DAYS  Final   Report Status 09/06/2016 FINAL  Final  Blood Culture (routine x 2)     Status: None   Collection Time: 09/01/16  8:25 PM  Result Value Ref Range Status   Specimen Description BLOOD LEFT ANTECUBITAL  Final   Special Requests IN PEDIATRIC BOTTLE Blood Culture adequate volume  Final   Culture NO GROWTH 5 DAYS  Final   Report Status 09/06/2016 FINAL  Final  Urine culture     Status: Abnormal   Collection Time: 09/01/16  9:40 PM   Result Value Ref Range Status   Specimen Description URINE, CLEAN CATCH  Final   Special Requests NONE  Final   Culture MULTIPLE SPECIES PRESENT, SUGGEST RECOLLECTION (A)  Final   Report Status 09/03/2016 FINAL  Final     Labs: Basic Metabolic Panel:  Recent Labs Lab 09/01/16 2025 09/04/16 1852 09/05/16 0535 09/06/16 0548  NA 133* 139 139 134*  K 4.9 4.6 4.1 3.5  CL 101 106 110 103  CO2 13* 17* 18* 22  GLUCOSE 83 94 91 90  BUN 45* 33* 24* 16  CREATININE 1.50* 1.30* 0.95 0.72  CALCIUM 9.3 9.5 8.4* 8.2*   Liver Function Tests:  Recent Labs Lab 09/01/16 2025 09/04/16 1852 09/05/16 0951 09/06/16 0548  AST 38 40  --  28  ALT 29 32  --  21  ALKPHOS 72 71  --  53  BILITOT 3.9* 4.9* 4.2* 3.3*  PROT 7.2 7.9  --  5.8*  ALBUMIN 3.6 4.2  --  3.1*    Recent Labs Lab 09/04/16 1852  LIPASE 34   No results for input(s): AMMONIA in the last 168 hours. CBC:  Recent Labs Lab 09/01/16 2025 09/04/16 1852 09/05/16 0535 09/06/16 0548  WBC 8.3 9.9 6.4 5.4  NEUTROABS 6.6  --   --   --   HGB 12.4* 12.1* 9.3* 9.3*  HCT 38.4* 36.3* 28.4* 27.6*  MCV 92.8 93.3 92.2  94.2  PLT 371 317 296 241   Cardiac Enzymes: No results for input(s): CKTOTAL, CKMB, CKMBINDEX, TROPONINI in the last 168 hours. BNP: BNP (last 3 results) No results for input(s): BNP in the last 8760 hours.  ProBNP (last 3 results) No results for input(s): PROBNP in the last 8760 hours.  CBG: No results for input(s): GLUCAP in the last 168 hours.     SignedZannie Cove MD.  [facility] 09/07/2016, 12:11 PM

## 2016-09-07 NOTE — Progress Notes (Signed)
LCSW following for disposition:  Bed accepted at [facility]  Discussed bed offers with patient at bedside who [facility] in agreement with admission for today. Paged MD to confirm plan for discharge.  Niece contacted and also agreeable. Confirms placement and EMS to transport. RN aware of plan.  Will facilitate DC and orders once available.  Deretha EmoryHannah Amanpreet Delmont LCSW, MSW Clinical Social Work: Optician, dispensingystem Wide Float Coverage for :  [phone]

## 2016-09-08 ENCOUNTER — Encounter: Payer: Self-pay | Admitting: Adult Health

## 2016-09-08 ENCOUNTER — Non-Acute Institutional Stay (SKILLED_NURSING_FACILITY): Payer: Medicare Other | Admitting: Adult Health

## 2016-09-08 DIAGNOSIS — M79605 Pain in left leg: Secondary | ICD-10-CM

## 2016-09-08 DIAGNOSIS — R5381 Other malaise: Secondary | ICD-10-CM | POA: Diagnosis not present

## 2016-09-08 DIAGNOSIS — R1310 Dysphagia, unspecified: Secondary | ICD-10-CM

## 2016-09-08 DIAGNOSIS — I10 Essential (primary) hypertension: Secondary | ICD-10-CM | POA: Diagnosis not present

## 2016-09-08 DIAGNOSIS — R634 Abnormal weight loss: Secondary | ICD-10-CM

## 2016-09-08 DIAGNOSIS — G40909 Epilepsy, unspecified, not intractable, without status epilepticus: Secondary | ICD-10-CM | POA: Diagnosis not present

## 2016-09-08 DIAGNOSIS — I63512 Cerebral infarction due to unspecified occlusion or stenosis of left middle cerebral artery: Secondary | ICD-10-CM

## 2016-09-08 DIAGNOSIS — E538 Deficiency of other specified B group vitamins: Secondary | ICD-10-CM | POA: Insufficient documentation

## 2016-09-08 DIAGNOSIS — M79604 Pain in right leg: Secondary | ICD-10-CM

## 2016-09-08 LAB — CEA: CEA1: 2 ng/mL (ref 0.0–4.7)

## 2016-09-08 NOTE — Progress Notes (Addendum)
Location:   [facility] Room Number: 116 A Place of Service:  SNF (31)   CODE STATUS: Full Code  No Known Allergies  Chief Complaint  Patient presents with  . Hospitalization Follow-up    Hospital follow up    HPI:  He is a 76 year old man who has been hospitalized for dysphagia; malnutrition; weakness and weight loss.  He will need to have an EGD on an outpatient basis. He did have a barium swallow while hospitalized. He is presently on thin liquids. He had been living in an independent living environment. The goal at this time is for him to return back to his home. He tells me that he is feeling ok.    Past Medical History:  Diagnosis Date  . Seizures (HCC)   . Stroke [facility])     History reviewed. No pertinent surgical history.  Social History   Social History  . Marital status: Single    Spouse name: N/A  . Number of children: 2  . Years of education: 9th   Occupational History  . Retired    Social History Main Topics  . Smoking status: Former Smoker    Packs/day: 0.50    Years: 55.00    Types: Cigarettes    Quit date: 05/08/2012  . Smokeless tobacco: Never Used  . Alcohol use No  . Drug use: No  . Sexual activity: Not on file   Other Topics Concern  . Not on file   Social History Narrative   Patient is single and  lives at home alone.   Patient has two children.   Patient is retired.   Patient has a 9th grade education.   Caffeine Use: 5 cups of tea daily   Patient is right-handed.   Family History  Problem Relation Age of Onset  . Stroke Mother   . Stroke Sister        2000      VITAL SIGNS BP (!) 100/52   Pulse 82   Temp (!) 97.2 F (36.2 C)   Resp 14   Ht 5\' 10"  (1.778 m)   Wt 110 lb 6.4 oz (50.1 kg)   SpO2 100%   BMI 15.84 kg/m   Patient's Medications  New Prescriptions   No medications on file  Previous Medications   ASPIRIN 81 MG CHEWABLE TABLET    Chew 1 tablet (81 mg total) by mouth daily.   LEVETIRACETAM  (KEPPRA XR) 500 MG 24 HR TABLET    Take 2 tablets (1,000 mg total) by mouth at bedtime.   NUTRITIONAL SUPPLEMENTS (NUTRITIONAL SUPPLEMENT PO)    House Supplement - Med Pass - Give 120 ml between meals daily  Modified Medications   No medications on file  Discontinued Medications   FEEDING SUPPLEMENT, ENSURE ENLIVE, (ENSURE ENLIVE) LIQD    Take 237 mLs by mouth 2 (two) times daily between meals.     SIGNIFICANT DIAGNOSTIC EXAMS  TODAY:   09-01-16: ct angio of chest:  1. No evidence of pulmonary embolus. 2. Scattered peripheral blebs noted bilaterally, reflecting mild emphysema. Mild atelectasis at the left lung base. 3. Scattered coronary artery calcifications.   09-04-16: ct of head: Chronic microvascular ischemia without acute intracranial abnormality  09-05-16: barium swallow: Limited though unremarkable barium swallow without definitive evidence of esophageal dysmotility, stricture or mucosal abnormality.   LABS REVIEWED: TODAY  09-01-16: wbc 8.3; hgb 12.4; hct 38.4; mcv 92.8; plt 371; glucose 83; bun 45; creat 1.50; k+ 4.9; na++  133; ca 9.3; total bili: 3.9; albumin 3.6; blood culture: no growth; urine culture:mixed bacteria 09-04-16: glucose 94; bun 33; creat 1.30; k+ 4.6; na++ 139; ca 9.5;  Total bili 4.9; albumin 4.2 09-05-16: wbc 6.4; hgb 9.3; hct 28.4;  mcv 92.2; plt 296; glucose 91; bun 24; creat 0.95 ;k+ 4.1; na++ 139; ca 8.4; iron 58; tibc 356; ferritin 95; tsh 3.322; vit B 12: 169; folate 4.0 AFP tumor marker 1.8; CA19-9: 1   Review of Systems  Constitutional: Negative for malaise/fatigue.  Respiratory: Negative for cough and shortness of breath.   Cardiovascular: Negative for chest pain, palpitations and leg swelling.  Gastrointestinal: Negative for abdominal pain, constipation and heartburn.  Musculoskeletal: Positive for joint pain and myalgias. Negative for back pain.       Has bilateral leg and knee pain   Skin: Negative.   Neurological: Negative for dizziness.    Psychiatric/Behavioral: The patient is not nervous/anxious.    Physical Exam  Constitutional: No distress.  Frail   HENT:  His teeth are rotted Has significant halitosis   Eyes: Conjunctivae are normal.  Neck: Neck supple. No JVD present. No thyromegaly present.  Cardiovascular: Normal rate, regular rhythm and intact distal pulses.   Respiratory: Effort normal and breath sounds normal. No respiratory distress. He has no wheezes.  GI: Soft. Bowel sounds are normal. He exhibits no distension. There is no tenderness.  Musculoskeletal: He exhibits no edema.  Able to move all extremities  Is bow legged has tightness in bilateral thighs Has lower extremity weakness   Lymphadenopathy:    He has no cervical adenopathy.  Neurological: He is alert.  Skin: Skin is warm and dry. He is not diaphoretic.  Psychiatric: He has a normal mood and affect.     ASSESSMENT/ PLAN:  TODAY:   1.  CVA: left ischemic: is neurologically stable: will continue therapy as directed and will continue asa 81 mg daily  2. Hypertension: b/p 100/52 is stable; is presently not on any medications; will not make changes will monitor his status.  3. Seizure; no reports of recent seizure activities: will continue keppra xr 2 gm daily   4.  Weight loss: his current weight is 110 pounds; will continue supplements as directed.   5. Dysphagia: stable no signs of aspiration; will continue with speech therapy and thin liquids  6. Weakness and physical deconditioning: will continue therapy as directed; to improve strength gait balance; adl training and speech therapy.   7. Hyperbilirubinemia: has increased: total bili 4.9: CA 19-9 normal will repeat cmp and will monitor  8. Bilateral lower extremity pain: will begin tylenol 650 mg three times daily and will begin baclofen 5 mg three times daily  9. Vitamin B 12 deficiency: is worse; level is 169; will being vitamin b 12 1 mL IM weekly for 4 weeks then monthly will  monitor   10. Halitosis:   Is significant; will set up dental consult. Will have staff brush teeth four times daily and will use listerine swish and spit: four times daily   Will check cmp Will setup with Dr. Levora AngelBrahmbhatt     MD is aware of resident's narcotic use and is in agreement with current plan of care. We will attempt to wean resident as apropriate   Synthia Innocenteborah Nakeya Adinolfi NP [facility]  Contact [phone] Monday through Friday 8am- 5pm  After hours call [phone]

## 2016-09-09 LAB — BASIC METABOLIC PANEL
BUN: 10 (ref 4–21)
CREATININE: 0.7 (ref 0.6–1.3)
GLUCOSE: 138
POTASSIUM: 4.7 (ref 3.4–5.3)
Sodium: 136 — AB (ref 137–147)

## 2016-09-09 LAB — HEPATIC FUNCTION PANEL
ALT: 22 (ref 10–40)
AST: 29 (ref 14–40)
Alkaline Phosphatase: 87 (ref 25–125)
Bilirubin, Total: 1.8

## 2016-09-10 ENCOUNTER — Encounter: Payer: Self-pay | Admitting: Internal Medicine

## 2016-09-10 ENCOUNTER — Non-Acute Institutional Stay (SKILLED_NURSING_FACILITY): Payer: Medicare Other | Admitting: Internal Medicine

## 2016-09-10 DIAGNOSIS — G40909 Epilepsy, unspecified, not intractable, without status epilepticus: Secondary | ICD-10-CM

## 2016-09-10 DIAGNOSIS — E8809 Other disorders of plasma-protein metabolism, not elsewhere classified: Secondary | ICD-10-CM | POA: Diagnosis not present

## 2016-09-10 DIAGNOSIS — E538 Deficiency of other specified B group vitamins: Secondary | ICD-10-CM | POA: Diagnosis not present

## 2016-09-10 DIAGNOSIS — R1310 Dysphagia, unspecified: Secondary | ICD-10-CM | POA: Diagnosis not present

## 2016-09-10 DIAGNOSIS — R4701 Aphasia: Secondary | ICD-10-CM

## 2016-09-10 DIAGNOSIS — K089 Disorder of teeth and supporting structures, unspecified: Secondary | ICD-10-CM

## 2016-09-10 DIAGNOSIS — D519 Vitamin B12 deficiency anemia, unspecified: Secondary | ICD-10-CM

## 2016-09-10 DIAGNOSIS — Z8673 Personal history of transient ischemic attack (TIA), and cerebral infarction without residual deficits: Secondary | ICD-10-CM | POA: Diagnosis not present

## 2016-09-10 DIAGNOSIS — R634 Abnormal weight loss: Secondary | ICD-10-CM | POA: Diagnosis not present

## 2016-09-10 DIAGNOSIS — I10 Essential (primary) hypertension: Secondary | ICD-10-CM | POA: Diagnosis not present

## 2016-09-10 DIAGNOSIS — R5381 Other malaise: Secondary | ICD-10-CM

## 2016-09-10 NOTE — Progress Notes (Signed)
Patient ID: Kyle Dudley, male   DOB: [birth year], 76 y.o.   MRN: [id number]     HISTORY AND PHYSICAL   DATE:  September 10, 2016  Location:   [place] Room Number: 116 A Place of Service: SNF (31)   Extended Emergency Contact Information Primary Emergency Contact: Truddie Crumble [place] Phone: [phone] Relation: Nephew Secondary Emergency Contact: Mayfield,Tiffany  [place] Phone: [phone] Relation: Niece  Advanced Directive information Does Patient Have a Medical Advance Directive?: No, Would patient like information on creating a medical advance directive?: No - Patient declined  Chief Complaint  Patient presents with  . New Admit To SNF    Admission    HPI:  76 yo male seen today as a new admission into SNF following hospital stay for hx ischemic left MCA CVA in 2013, dysphagia, sz d/o, weight loss, weakness. CT head revealed chronic microvascular ischemia but no acute process. Esophagram neg for esophageal dysmotility. EGD planned for o/p. SLP suspected oropharyngeal dysphagia combined with dental problems. CA 19-9 level 1; AFP 1.8; CEA 2; PSA 0.26; Hgb 12.1-->9.3; Cr 1.3-->0.72; albumin 3.1; T bilirubin 3.3 with fractionated direct 0.7 and indirect 3.5; B12 level 169; folate 4; nml ferritin and iron levels at d/c. He presents to SNF for short term rehab.   Today he reports expressive aphasia and not able to "get my words out". He has LLE weakness but is tolerating tx. He continues to have dysphagia. He is a poor historian due to aphasia. Hx obtained from chart. He has significant halitosis 2/2 poor dentition. It has improved since frequent teeth brushing and use of listerine rinses.  Hx left MCA CVA in 2013 - he has expressive aphasia. Takes ASA 81 mg daily.  Hypertension - diet controlled  Seizure d/o - controlled on keppra XR 2 gm daily   Weight loss/hypoalbuminemia - albumin 3.1 at d/c but currently 4 likely  2/2 dehydration. He gets nutritional supplements per facility protocol  Hyperbilirubinemia - nml CA 19-9, AFP and CEA. Current T bilirubin 1.8 (prev 3.3)  Chronic b/l LE pain - improved on tylenol 650 mg three times daily and baclofen 5 mg three times daily. He is tolerating tx  Vitamin B 12 deficiency - current B12 level 169. He gets B12 injections weekly x 4 then monthly. MCV 94.2  Past Medical History:  Diagnosis Date  . Seizures ([place])   . Stroke [facility])     History reviewed. No pertinent surgical history.  Patient Care Team: Katherina Mires, MD as PCP - General (Family Medicine)  Social History   Social History  . Marital status: Single    Spouse name: N/A  . Number of children: 2  . Years of education: 9th   Occupational History  . Retired    Social History Main Topics  . Smoking status: Former Smoker    Packs/day: 0.50    Years: 55.00    Types: Cigarettes    Quit date: 05/08/2012  . Smokeless tobacco: Never Used  . Alcohol use No  . Drug use: No  . Sexual activity: Not on file   Other Topics Concern  . Not on file   Social History Narrative   Patient is single and  lives at home alone.   Patient has two children.   Patient is retired.   Patient has a 9th grade education.   Caffeine Use: 5 cups of tea daily   Patient is right-handed.  reports that he quit smoking about 4 years ago. His smoking use included Cigarettes. He has a 27.50 pack-year smoking history. He has never used smokeless tobacco. He reports that he does not drink alcohol or use drugs.  Family History  Problem Relation Age of Onset  . Stroke Mother   . Stroke Sister        2000   Family Status  Relation Status  . Mother Deceased  . Father Deceased       fall  . Sister (Not Specified)    Immunization History  Administered Date(s) Administered  . PPD Test 09/08/2016    No Known Allergies  Medications: Patient's Medications  New Prescriptions   No medications on file    Previous Medications   ACETAMINOPHEN (TYLENOL) 325 MG TABLET    Take 650 mg by mouth 3 (three) times daily.   ASPIRIN 81 MG CHEWABLE TABLET    Chew 1 tablet (81 mg total) by mouth daily.   BACLOFEN (LIORESAL) 10 MG TABLET    Take 5 mg by mouth 4 (four) times daily.   CYANOCOBALAMIN (,VITAMIN B-12,) 1000 MCG/ML INJECTION    Inject 1,000 mcg into the muscle once. Every Wednesday x 4 weeks then inject 1010mg intramuscularly one time daily starting on 10-07-16 and ending on the 12th of every month   DICLOFENAC SODIUM (VOLTAREN) 1 % GEL    Apply 4 g topically 3 (three) times daily. Apply to back of knees bilaterally   LEVETIRACETAM (KEPPRA XR) 500 MG 24 HR TABLET    Take 2 tablets (1,000 mg total) by mouth at bedtime.   NUTRITIONAL SUPPLEMENTS (NUTRITIONAL SUPPLEMENT PO)    House Supplement - Med Pass - Give 120 ml between meals daily  Modified Medications   No medications on file  Discontinued Medications   No medications on file    Review of Systems  Unable to perform ROS: Other (expressive aphasia)    Vitals:   09/10/16 1106  BP: (!) 100/52  Pulse: 82  Resp: 14  Temp: (!) 97.2 F (36.2 C)  SpO2: 100%  Weight: 110 lb 6.4 oz (50.1 kg)  Height: 5' 10"  (1.778 m)   Body mass index is 15.84 kg/m.  Physical Exam  Constitutional: He appears well-developed and well-nourished.  Frail appearing sitting in bed in NAD  HENT:  Mouth/Throat: Oropharynx is clear and moist.  Poor dentition; MMdry; no oral thrush; no halitosis  Eyes: Pupils are equal, round, and reactive to light. No scleral icterus.  Neck: Neck supple. Carotid bruit is not present.  Cardiovascular: Normal rate, regular rhythm and intact distal pulses.  Exam reveals no gallop and no friction rub.   Murmur (1/6 SEM) heard. No distal LE edema. No calf TTP  Pulmonary/Chest: Effort normal and breath sounds normal. He has no wheezes. He has no rales. He exhibits no tenderness.  Abdominal: Soft. Normal appearance and bowel sounds  are normal. He exhibits no distension, no abdominal bruit, no pulsatile midline mass and no mass. There is no hepatomegaly. There is no tenderness. There is no rigidity, no rebound and no guarding. No hernia.  Musculoskeletal: He exhibits edema.  Lymphadenopathy:    He has no cervical adenopathy.  Neurological: He is alert. He displays no atrophy. He displays no seizure activity.  LLE strength 4/5; strength otherwise intact; expressive aphasia  Skin: Skin is warm and dry. No rash noted.  Psychiatric: He has a normal mood and affect. His behavior is normal. His speech is delayed.  Labs reviewed: Admission on 09/04/2016, Discharged on 09/07/2016  Component Date Value Ref Range Status  . WBC 09/04/2016 9.9  4.0 - 10.5 K/uL Final  . RBC 09/04/2016 3.89* 4.22 - 5.81 MIL/uL Final  . Hemoglobin 09/04/2016 12.1* 13.0 - 17.0 g/dL Final  . HCT 09/04/2016 36.3* 39.0 - 52.0 % Final  . MCV 09/04/2016 93.3  78.0 - 100.0 fL Final  . MCH 09/04/2016 31.1  26.0 - 34.0 pg Final  . MCHC 09/04/2016 33.3  30.0 - 36.0 g/dL Final  . RDW 09/04/2016 15.0  11.5 - 15.5 % Final  . Platelets 09/04/2016 317  150 - 400 K/uL Final  . Sodium 09/04/2016 139  135 - 145 mmol/L Final  . Potassium 09/04/2016 4.6  3.5 - 5.1 mmol/L Final  . Chloride 09/04/2016 106  101 - 111 mmol/L Final  . CO2 09/04/2016 17* 22 - 32 mmol/L Final  . Glucose, Bld 09/04/2016 94  65 - 99 mg/dL Final  . BUN 09/04/2016 33* 6 - 20 mg/dL Final  . Creatinine, Ser 09/04/2016 1.30* 0.61 - 1.24 mg/dL Final  . Calcium 09/04/2016 9.5  8.9 - 10.3 mg/dL Final  . Total Protein 09/04/2016 7.9  6.5 - 8.1 g/dL Final  . Albumin 09/04/2016 4.2  3.5 - 5.0 g/dL Final  . AST 09/04/2016 40  15 - 41 U/L Final  . ALT 09/04/2016 32  17 - 63 U/L Final  . Alkaline Phosphatase 09/04/2016 71  38 - 126 U/L Final  . Total Bilirubin 09/04/2016 4.9* 0.3 - 1.2 mg/dL Final  . GFR calc non Af Amer 09/04/2016 52* >60 mL/min Final  . GFR calc Af Amer 09/04/2016 >60  >60  mL/min Final   Comment: (NOTE) The eGFR has been calculated using the CKD EPI equation. This calculation has not been validated in all clinical situations. eGFR's persistently <60 mL/min signify possible Chronic Kidney Disease.   . Anion gap 09/04/2016 16* 5 - 15 Final  . Lactic Acid, Venous 09/04/2016 2.86* 0.5 - 1.9 mmol/L Final  . Comment 09/04/2016 NOTIFIED PHYSICIAN   Final  . Color, Urine 09/04/2016 AMBER* YELLOW Final   BIOCHEMICALS MAY BE AFFECTED BY COLOR  . APPearance 09/04/2016 HAZY* CLEAR Final  . Specific Gravity, Urine 09/04/2016 1.028  1.005 - 1.030 Final  . pH 09/04/2016 5.0  5.0 - 8.0 Final  . Glucose, UA 09/04/2016 NEGATIVE  NEGATIVE mg/dL Final  . Hgb urine dipstick 09/04/2016 NEGATIVE  NEGATIVE Final  . Bilirubin Urine 09/04/2016 NEGATIVE  NEGATIVE Final  . Ketones, ur 09/04/2016 20* NEGATIVE mg/dL Final  . Protein, ur 09/04/2016 NEGATIVE  NEGATIVE mg/dL Final  . Nitrite 09/04/2016 NEGATIVE  NEGATIVE Final  . Leukocytes, UA 09/04/2016 TRACE* NEGATIVE Final  . RBC / HPF 09/04/2016 0-5  0 - 5 RBC/hpf Final  . WBC, UA 09/04/2016 6-30  0 - 5 WBC/hpf Final  . Bacteria, UA 09/04/2016 NONE SEEN  NONE SEEN Final  . Squamous Epithelial / LPF 09/04/2016 0-5* NONE SEEN Final  . Mucous 09/04/2016 PRESENT   Final  . Granular Casts, UA 09/04/2016 PRESENT   Final  . Troponin i, poc 09/04/2016 0.02  0.00 - 0.08 ng/mL Final  . Comment 3 09/04/2016          Final   Comment: Due to the release kinetics of cTnI, a negative result within the first hours of the onset of symptoms does not rule out myocardial infarction with certainty. If myocardial infarction is still suspected, repeat the test at appropriate intervals.   Marland Kitchen  Lipase 09/04/2016 34  11 - 51 U/L Final  . Lactic Acid, Venous 09/04/2016 2.20* 0.5 - 1.9 mmol/L Final  . Comment 09/04/2016 NOTIFIED PHYSICIAN   Final  . Sodium 09/05/2016 139  135 - 145 mmol/L Final  . Potassium 09/05/2016 4.1  3.5 - 5.1 mmol/L Final  .  Chloride 09/05/2016 110  101 - 111 mmol/L Final  . CO2 09/05/2016 18* 22 - 32 mmol/L Final  . Glucose, Bld 09/05/2016 91  65 - 99 mg/dL Final  . BUN 09/05/2016 24* 6 - 20 mg/dL Final  . Creatinine, Ser 09/05/2016 0.95  0.61 - 1.24 mg/dL Final  . Calcium 09/05/2016 8.4* 8.9 - 10.3 mg/dL Final  . GFR calc non Af Amer 09/05/2016 >60  >60 mL/min Final  . GFR calc Af Amer 09/05/2016 >60  >60 mL/min Final   Comment: (NOTE) The eGFR has been calculated using the CKD EPI equation. This calculation has not been validated in all clinical situations. eGFR's persistently <60 mL/min signify possible Chronic Kidney Disease.   . Anion gap 09/05/2016 11  5 - 15 Final  . WBC 09/05/2016 6.4  4.0 - 10.5 K/uL Final  . RBC 09/05/2016 3.08* 4.22 - 5.81 MIL/uL Final  . Hemoglobin 09/05/2016 9.3* 13.0 - 17.0 g/dL Final   Comment: DELTA CHECK NOTED REPEATED TO VERIFY   . HCT 09/05/2016 28.4* 39.0 - 52.0 % Final  . MCV 09/05/2016 92.2  78.0 - 100.0 fL Final  . MCH 09/05/2016 30.2  26.0 - 34.0 pg Final  . MCHC 09/05/2016 32.7  30.0 - 36.0 g/dL Final  . RDW 09/05/2016 15.2  11.5 - 15.5 % Final  . Platelets 09/05/2016 296  150 - 400 K/uL Final  . CEA 09/05/2016 2.0  0.0 - 4.7 ng/mL Final   Comment: (NOTE)       Roche ECLIA methodology       Nonsmokers  <3.9                                     Smokers     <5.6 Performed At: [facility] [place], [place] [id number] Lindon Romp MD [id number]   . Prostatic Specific Antigen 09/05/2016 0.26  0.00 - 4.00 ng/mL Final   Comment: (NOTE) While PSA levels of <=4.0 ng/ml are reported as reference range, some men with levels below 4.0 ng/ml can have prostate cancer and many men with PSA above 4.0 ng/ml do not have prostate cancer.  Other tests such as free PSA, age specific reference ranges, PSA velocity and PSA doubling time may be helpful especially in men less than 50 years old. Performed at [facility], [place] [address]  [place], [place] [address]   . AFP, Serum, Tumor Marker 09/05/2016 1.8  0.0 - 8.3 ng/mL Final   Comment: (NOTE) Roche ECLIA methodology Performed At: [facility] [place], [place] [id number] Lindon Romp MD [id number]   . CA 19-9 09/05/2016 1  0 - 35 U/mL Final   Comment: (NOTE) Roche ECLIA methodology Performed At: [facility] [place], [place] [id number] Lindon Romp MD [id number]:[id number]   . Vitamin B-12 09/05/2016 169* 180 - 914 pg/mL Final   Comment: (NOTE) This assay is not validated for testing neonatal or myeloproliferative syndrome specimens for Vitamin B12 levels. Performed at [facility], [place] [address], [place], [place] [address]   .  Folate 09/05/2016 4.0* >5.9 ng/mL Final   Performed at [facility], [place] [address] [place], [place] [address]  . Iron 09/05/2016 58  45 - 182 ug/dL Final  . TIBC 09/05/2016 356  250 - 450 ug/dL Final  . Saturation Ratios 09/05/2016 16* 17.9 - 39.5 % Final  . UIBC 09/05/2016 298  ug/dL Final   Performed at [facility], [place] [address] [place], [place] [address]  . Ferritin 09/05/2016 95  24 - 336 ng/mL Final   Performed at [facility] [address], [place], [place] [address]  . Retic Ct Pct 09/05/2016 5.5* 0.4 - 3.1 % Final  . RBC. 09/05/2016 3.17* 4.22 - 5.81 MIL/uL Final  . Retic Count, Absolute 09/05/2016 174.4  19.0 - 186.0 K/uL Final  . TSH 09/05/2016 3.322  0.350 - 4.500 uIU/mL Final   Performed by a 3rd Generation assay with a functional sensitivity of <=0.01 uIU/mL.  Marland Kitchen Total Bilirubin 09/05/2016 4.2* 0.3 - 1.2 mg/dL Final  . Bilirubin, Direct 09/05/2016 0.7* 0.1 - 0.5 mg/dL Final  . Indirect Bilirubin 09/05/2016 3.5* 0.3 - 0.9 mg/dL Final  . Sodium 09/06/2016 134* 135 - 145 mmol/L Final  . Potassium 09/06/2016 3.5  3.5 - 5.1 mmol/L Final  . Chloride 09/06/2016 103  101 - 111 mmol/L Final  . CO2 09/06/2016 22  22 - 32 mmol/L Final  . Glucose, Bld  09/06/2016 90  65 - 99 mg/dL Final  . BUN 09/06/2016 16  6 - 20 mg/dL Final  . Creatinine, Ser 09/06/2016 0.72  0.61 - 1.24 mg/dL Final  . Calcium 09/06/2016 8.2* 8.9 - 10.3 mg/dL Final  . Total Protein 09/06/2016 5.8* 6.5 - 8.1 g/dL Final  . Albumin 09/06/2016 3.1* 3.5 - 5.0 g/dL Final  . AST 09/06/2016 28  15 - 41 U/L Final  . ALT 09/06/2016 21  17 - 63 U/L Final  . Alkaline Phosphatase 09/06/2016 53  38 - 126 U/L Final  . Total Bilirubin 09/06/2016 3.3* 0.3 - 1.2 mg/dL Final  . GFR calc non Af Amer 09/06/2016 >60  >60 mL/min Final  . GFR calc Af Amer 09/06/2016 >60  >60 mL/min Final   Comment: (NOTE) The eGFR has been calculated using the CKD EPI equation. This calculation has not been validated in all clinical situations. eGFR's persistently <60 mL/min signify possible Chronic Kidney Disease.   . Anion gap 09/06/2016 9  5 - 15 Final  . WBC 09/06/2016 5.4  4.0 - 10.5 K/uL Final  . RBC 09/06/2016 2.93* 4.22 - 5.81 MIL/uL Final  . Hemoglobin 09/06/2016 9.3* 13.0 - 17.0 g/dL Final  . HCT 09/06/2016 27.6* 39.0 - 52.0 % Final  . MCV 09/06/2016 94.2  78.0 - 100.0 fL Final  . MCH 09/06/2016 31.7  26.0 - 34.0 pg Final  . MCHC 09/06/2016 33.7  30.0 - 36.0 g/dL Final  . RDW 09/06/2016 15.3  11.5 - 15.5 % Final  . Platelets 09/06/2016 241  150 - 400 K/uL Final  Admission on 09/01/2016, Discharged on 09/02/2016  Component Date Value Ref Range Status  . Lactic Acid, Venous 09/01/2016 3.08* 0.5 - 1.9 mmol/L Final  . Comment 09/01/2016 NOTIFIED PHYSICIAN   Final  . Sodium 09/01/2016 133* 135 - 145 mmol/L Final  . Potassium 09/01/2016 4.9  3.5 - 5.1 mmol/L Final  . Chloride 09/01/2016 101  101 - 111 mmol/L Final  . CO2 09/01/2016 13* 22 - 32 mmol/L Final  . Glucose, Bld 09/01/2016  83  65 - 99 mg/dL Final  . BUN 09/01/2016 45* 6 - 20 mg/dL Final  . Creatinine, Ser 09/01/2016 1.50* 0.61 - 1.24 mg/dL Final  . Calcium 09/01/2016 9.3  8.9 - 10.3 mg/dL Final  . Total Protein 09/01/2016 7.2  6.5  - 8.1 g/dL Final  . Albumin 09/01/2016 3.6  3.5 - 5.0 g/dL Final  . AST 09/01/2016 38  15 - 41 U/L Final  . ALT 09/01/2016 29  17 - 63 U/L Final  . Alkaline Phosphatase 09/01/2016 72  38 - 126 U/L Final  . Total Bilirubin 09/01/2016 3.9* 0.3 - 1.2 mg/dL Final  . GFR calc non Af Amer 09/01/2016 44* >60 mL/min Final  . GFR calc Af Amer 09/01/2016 51* >60 mL/min Final   Comment: (NOTE) The eGFR has been calculated using the CKD EPI equation. This calculation has not been validated in all clinical situations. eGFR's persistently <60 mL/min signify possible Chronic Kidney Disease.   . Anion gap 09/01/2016 19* 5 - 15 Final  . WBC 09/01/2016 8.3  4.0 - 10.5 K/uL Final  . RBC 09/01/2016 4.14* 4.22 - 5.81 MIL/uL Final  . Hemoglobin 09/01/2016 12.4* 13.0 - 17.0 g/dL Final  . HCT 09/01/2016 38.4* 39.0 - 52.0 % Final  . MCV 09/01/2016 92.8  78.0 - 100.0 fL Final  . MCH 09/01/2016 30.0  26.0 - 34.0 pg Final  . MCHC 09/01/2016 32.3  30.0 - 36.0 g/dL Final  . RDW 09/01/2016 14.1  11.5 - 15.5 % Final  . Platelets 09/01/2016 371  150 - 400 K/uL Final  . Neutrophils Relative % 09/01/2016 78  % Final  . Neutro Abs 09/01/2016 6.6  1.7 - 7.7 K/uL Final  . Lymphocytes Relative 09/01/2016 14  % Final  . Lymphs Abs 09/01/2016 1.1  0.7 - 4.0 K/uL Final  . Monocytes Relative 09/01/2016 8  % Final  . Monocytes Absolute 09/01/2016 0.6  0.1 - 1.0 K/uL Final  . Eosinophils Relative 09/01/2016 0  % Final  . Eosinophils Absolute 09/01/2016 0.0  0.0 - 0.7 K/uL Final  . Basophils Relative 09/01/2016 0  % Final  . Basophils Absolute 09/01/2016 0.0  0.0 - 0.1 K/uL Final  . Specimen Description 09/01/2016 BLOOD RIGHT ANTECUBITAL   Final  . Special Requests 09/01/2016 BOTTLES DRAWN AEROBIC AND ANAEROBIC Blood Culture adequate volume   Final  . Culture 09/01/2016 NO GROWTH 5 DAYS   Final  . Report Status 09/01/2016 09/06/2016 FINAL   Final  . Specimen Description 09/01/2016 BLOOD LEFT ANTECUBITAL   Final  . Special  Requests 09/01/2016 IN PEDIATRIC BOTTLE Blood Culture adequate volume   Final  . Culture 09/01/2016 NO GROWTH 5 DAYS   Final  . Report Status 09/01/2016 09/06/2016 FINAL   Final  . Specimen Description 09/01/2016 URINE, CLEAN CATCH   Final  . Special Requests 09/01/2016 NONE   Final  . Culture 09/01/2016 MULTIPLE SPECIES PRESENT, SUGGEST RECOLLECTION*  Final  . Report Status 09/01/2016 09/03/2016 FINAL   Final  . D-Dimer, Quant 09/01/2016 2.32* 0.00 - 0.50 ug/mL-FEU Final   Comment: (NOTE) At the manufacturer cut-off of 0.50 ug/mL FEU, this assay has been documented to exclude PE with a sensitivity and negative predictive value of 97 to 99%.  At this time, this assay has not been approved by the FDA to exclude DVT/VTE. Results should be correlated with clinical presentation.   . Lactic Acid, Venous 09/01/2016 2.20* 0.5 - 1.9 mmol/L Final  . Comment 09/01/2016 NOTIFIED PHYSICIAN  Final  . Color, Urine 09/01/2016 AMBER* YELLOW Final   BIOCHEMICALS MAY BE AFFECTED BY COLOR  . APPearance 09/01/2016 CLEAR  CLEAR Final  . Specific Gravity, Urine 09/01/2016 1.025  1.005 - 1.030 Final  . pH 09/01/2016 5.0  5.0 - 8.0 Final  . Glucose, UA 09/01/2016 NEGATIVE  NEGATIVE mg/dL Final  . Hgb urine dipstick 09/01/2016 NEGATIVE  NEGATIVE Final  . Bilirubin Urine 09/01/2016 NEGATIVE  NEGATIVE Final  . Ketones, ur 09/01/2016 20* NEGATIVE mg/dL Final  . Protein, ur 09/01/2016 NEGATIVE  NEGATIVE mg/dL Final  . Nitrite 09/01/2016 NEGATIVE  NEGATIVE Final  . Leukocytes, UA 09/01/2016 NEGATIVE  NEGATIVE Final  Admission on 08/18/2016, Discharged on 08/18/2016  Component Date Value Ref Range Status  . WBC 08/18/2016 7.8  4.0 - 10.5 K/uL Final  . RBC 08/18/2016 4.69  4.22 - 5.81 MIL/uL Final  . Hemoglobin 08/18/2016 13.9  13.0 - 17.0 g/dL Final  . HCT 08/18/2016 40.7  39.0 - 52.0 % Final  . MCV 08/18/2016 86.8  78.0 - 100.0 fL Final  . MCH 08/18/2016 29.6  26.0 - 34.0 pg Final  . MCHC 08/18/2016 34.2   30.0 - 36.0 g/dL Final  . RDW 08/18/2016 12.7  11.5 - 15.5 % Final  . Platelets 08/18/2016 286  150 - 400 K/uL Final  . Neutrophils Relative % 08/18/2016 74  % Final  . Neutro Abs 08/18/2016 5.9  1.7 - 7.7 K/uL Final  . Lymphocytes Relative 08/18/2016 19  % Final  . Lymphs Abs 08/18/2016 1.4  0.7 - 4.0 K/uL Final  . Monocytes Relative 08/18/2016 5  % Final  . Monocytes Absolute 08/18/2016 0.4  0.1 - 1.0 K/uL Final  . Eosinophils Relative 08/18/2016 1  % Final  . Eosinophils Absolute 08/18/2016 0.0  0.0 - 0.7 K/uL Final  . Basophils Relative 08/18/2016 1  % Final  . Basophils Absolute 08/18/2016 0.0  0.0 - 0.1 K/uL Final  . Sodium 08/18/2016 132* 135 - 145 mmol/L Final  . Potassium 08/18/2016 2.6* 3.5 - 5.1 mmol/L Final   Comment: CRITICAL RESULT CALLED TO, READ BACK BY AND VERIFIED WITH: J.BAILEY,RN 08/18/16 1421 BY BSLADE   . Chloride 08/18/2016 98* 101 - 111 mmol/L Final  . CO2 08/18/2016 24  22 - 32 mmol/L Final  . Glucose, Bld 08/18/2016 128* 65 - 99 mg/dL Final  . BUN 08/18/2016 14  6 - 20 mg/dL Final  . Creatinine, Ser 08/18/2016 1.16  0.61 - 1.24 mg/dL Final  . Calcium 08/18/2016 9.2  8.9 - 10.3 mg/dL Final  . GFR calc non Af Amer 08/18/2016 60* >60 mL/min Final  . GFR calc Af Amer 08/18/2016 >60  >60 mL/min Final   Comment: (NOTE) The eGFR has been calculated using the CKD EPI equation. This calculation has not been validated in all clinical situations. eGFR's persistently <60 mL/min signify possible Chronic Kidney Disease.   . Anion gap 08/18/2016 10  5 - 15 Final  . Troponin i, poc 08/18/2016 0.00  0.00 - 0.08 ng/mL Final  . Comment 3 08/18/2016          Final   Comment: Due to the release kinetics of cTnI, a negative result within the first hours of the onset of symptoms does not rule out myocardial infarction with certainty. If myocardial infarction is still suspected, repeat the test at appropriate intervals.   . Magnesium 08/18/2016 1.9  1.7 - 2.4 mg/dL Final     Ct Head Wo Contrast  Result Date: 09/04/2016  CLINICAL DATA:  Generalized weakness EXAM: CT HEAD WITHOUT CONTRAST TECHNIQUE: Contiguous axial images were obtained from the base of the skull through the vertex without intravenous contrast. COMPARISON:  Brain MRI 05/10/2012 FINDINGS: Brain: No mass lesion, intraparenchymal hemorrhage or extra-axial collection. No evidence of acute cortical infarct. There is periventricular hypoattenuation compatible with chronic microvascular disease. Vascular: No hyperdense vessel or unexpected calcification. Skull: Normal visualized skull base, calvarium and extracranial soft tissues. Sinuses/Orbits: No sinus fluid levels or advanced mucosal thickening. No mastoid effusion. Normal orbits. IMPRESSION: Chronic microvascular ischemia without acute intracranial abnormality. Electronically Signed   By: Ulyses Jarred M.D.   On: 09/04/2016 20:42   Ct Angio Chest Pe W/cm &/or Wo Cm  Result Date: 09/01/2016 CLINICAL DATA:  Acute onset of elevated D-dimer and shortness of breath. Initial encounter. EXAM: CT ANGIOGRAPHY CHEST WITH CONTRAST TECHNIQUE: Multidetector CT imaging of the chest was performed using the standard protocol during bolus administration of intravenous contrast. Multiplanar CT image reconstructions and MIPs were obtained to evaluate the vascular anatomy. CONTRAST:  80 mL of Isovue 370 IV contrast COMPARISON:  Chest radiograph performed earlier today at 6:48 p.m. FINDINGS: Cardiovascular:  There is no evidence of pulmonary embolus. Scattered coronary artery calcifications are seen. The heart is normal in size. Scattered calcification is noted along the aortic arch. Mild mural thrombus is seen along the descending thoracic aorta, without significant luminal narrowing. Mild mural thrombus is also noted along the proximal left subclavian artery, without significant luminal narrowing. Mediastinum/Nodes: The mediastinum is unremarkable in appearance. No mediastinal  lymphadenopathy is seen. No pericardial effusion is identified. The visualized portions of the thyroid gland are unremarkable. No axillary lymphadenopathy is seen. Lungs/Pleura: Scattered peripheral blebs are noted bilaterally, reflecting mild emphysema. Mild atelectasis is noted at the left lung base. No pleural effusion or pneumothorax is seen. No masses are identified. Upper Abdomen: The visualized portions of the liver and spleen are grossly unremarkable. The visualized portions of the gallbladder, adrenal glands and left kidney are within normal limits. Musculoskeletal: No acute osseous abnormalities are identified. The visualized musculature is unremarkable in appearance. Review of the MIP images confirms the above findings. IMPRESSION: 1. No evidence of pulmonary embolus. 2. Scattered peripheral blebs noted bilaterally, reflecting mild emphysema. Mild atelectasis at the left lung base. 3. Scattered coronary artery calcifications. Electronically Signed   By: Garald Balding M.D.   On: 09/01/2016 23:42   Dg Esophagus  Result Date: 09/05/2016 CLINICAL DATA:  Dysphagia with solid use for the past 2 months. Unintended weight loss of 8 pounds during the past year. EXAM: ESOPHOGRAM/BARIUM SWALLOW TECHNIQUE: Single contrast examination was performed using  thin barium. FLUOROSCOPY TIME:  1 minute, 48 seconds (11.1 mGy) COMPARISON:  Chest CT - 09/01/2016 FINDINGS: Examination is degraded due to patient's limited mobility. Initial swallowing images are negative for evidence of aspiration. There is brisk passage of contrast through the esophagus without evidence of dysmotility or achalasia. No discrete mucosal abnormalities. There is brisk emptying of the esophagus into the stomach. Suspected tiny hiatal hernia. There is brisk emptying of the stomach with opacification of the proximal small bowel. IMPRESSION: Limited though unremarkable barium swallow without definitive evidence of esophageal dysmotility, stricture  or mucosal abnormality. Electronically Signed   By: Sandi Mariscal M.D.   On: 09/05/2016 12:05   Dg Chest Port 1 View  Result Date: 09/01/2016 CLINICAL DATA:  Weakness EXAM: PORTABLE CHEST 1 VIEW COMPARISON:  May 10, 2012 FINDINGS: There is no edema or consolidation. There is left  apical pleural calcification. Heart size and pulmonary vascularity are normal. No adenopathy. There is aortic atherosclerosis. No evident bone lesions. IMPRESSION: Left apical pleural calcification, likely due to previous inflammatory focus in this area. No edema or consolidation. There is aortic atherosclerosis. Aortic Atherosclerosis (ICD10-I70.0). Electronically Signed   By: Lowella Grip III M.D.   On: 09/01/2016 19:08   Dg Knee Complete 4 Views Left  Result Date: 08/18/2016 CLINICAL DATA:  Posterior knee pain with bending EXAM: LEFT KNEE - COMPLETE 4+ VIEW COMPARISON:  None. FINDINGS: No fracture or dislocation is evident. No large joint effusion. Minimal degenerative change of the medial compartment. Joint spaces are relatively maintained. Vascular calcifications. IMPRESSION: Negative. Electronically Signed   By: Donavan Foil M.D.   On: 08/18/2016 14:16   Dg Knee Complete 4 Views Right  Result Date: 08/18/2016 CLINICAL DATA:  Bilateral posterior knee pain with flexion. No known injuries. EXAM: RIGHT KNEE - COMPLETE 4+ VIEW COMPARISON:  None. FINDINGS: No evidence of acute fracture or dislocation. Well-preserved joint spaces. Well-preserved bone mineral density. No intrinsic osseous abnormality. No visible joint effusion. Femoropopliteal and tibioperoneal artery atherosclerosis. IMPRESSION: Normal right knee. Electronically Signed   By: Evangeline Dakin M.D.   On: 08/18/2016 14:17     Assessment/Plan   ICD-10-CM   1. Folate deficiency E53.8   2. Anemia due to vitamin B12 deficiency, unspecified B12 deficiency type D51.9   3. Physical deconditioning R53.81   4. Dysphagia, unspecified type R13.10   5. Expressive  aphasia R47.01   6. History of ischemic left MCA stroke Z86.73   7. Essential hypertension, benign I10   8. Seizure disorder ([place]) G40.909   9. Hypoalbuminemia E88.09   10. Weight loss R63.4   11. Poor dentition K08.9     Start folate supplement 0.25m daily  Cont other meds as ordered  Will need to see dentist  PT/OT/ST as ordered  F/u with specialists as scheduled. He will need to f/u with GI for EGD  GOAL: short term rehab and d/c home when medically appropriate. Communicated with pt and nursing.  Will follow  Jb Dulworth S. CPerlie Gold PFamily Surgery Centerand Adult [facility] [address]St[address] [place] [phone] (Monday-Friday 8 AM - 5 PM) [phone] 5 PM and follow prompts

## 2016-09-21 ENCOUNTER — Encounter: Payer: Self-pay | Admitting: Adult Health

## 2016-09-21 ENCOUNTER — Non-Acute Institutional Stay (SKILLED_NURSING_FACILITY): Payer: Medicare Other | Admitting: Adult Health

## 2016-09-21 DIAGNOSIS — G40909 Epilepsy, unspecified, not intractable, without status epilepticus: Secondary | ICD-10-CM | POA: Diagnosis not present

## 2016-09-21 DIAGNOSIS — I63512 Cerebral infarction due to unspecified occlusion or stenosis of left middle cerebral artery: Secondary | ICD-10-CM

## 2016-09-21 NOTE — Progress Notes (Signed)
Location:   [facility] Room Number: 116 A Place of Service:  SNF (31)    CODE STATUS: Full code  No Known Allergies  Chief Complaint  Patient presents with  . Discharge Note    Discharge to Home    HPI:  He had been admitted to the hospital for an acute ischemic left mca stroke. He was admitted to this facility for short term including physical occupational and speec therapy.  He will need front wheel walker and 3:1 commode. He will need home health for pt/ot/rn/cna/sw. He will need his prescriptions written and will need to follow up with his medical provider. The facility does know to arrange transportation to home.     Past Medical History:  Diagnosis Date  . Seizures (HCC)   . Stroke [facility])     History reviewed. No pertinent surgical history.  Social History   Social History  . Marital status: Single    Spouse name: N/A  . Number of children: 2  . Years of education: 9th   Occupational History  . Retired    Social History Main Topics  . Smoking status: Former Smoker    Packs/day: 0.50    Years: 55.00    Types: Cigarettes    Quit date: 05/08/2012  . Smokeless tobacco: Never Used  . Alcohol use No  . Drug use: No  . Sexual activity: Not on file   Other Topics Concern  . Not on file   Social History Narrative   Patient is single and  lives at home alone.   Patient has two children.   Patient is retired.   Patient has a 9th grade education.   Caffeine Use: 5 cups of tea daily   Patient is right-handed.   Family History  Problem Relation Age of Onset  . Stroke Mother   . Stroke Sister        2000    VITAL SIGNS BP 104/68   Pulse 99   Temp (!) 96.5 F (35.8 C)   Resp 18   Ht 5\' 10"  (1.778 m)   Wt 110 lb 6.4 oz (50.1 kg)   SpO2 98%   BMI 15.84 kg/m   Patient's Medications  New Prescriptions   No medications on file  Previous Medications   ACETAMINOPHEN (TYLENOL) 325 MG TABLET    Take 650 mg by mouth 3 (three) times daily.    ASPIRIN EC 81 MG TABLET    Take 81 mg by mouth daily.   BACLOFEN (LIORESAL) 10 MG TABLET    Take 5 mg by mouth 4 (four) times daily.   CYANOCOBALAMIN (,VITAMIN B-12,) 1000 MCG/ML INJECTION    Inject 1,000 mcg into the muscle once. Every Wednesday x 4 weeks then inject intramuscularly one time daily starting on 10-07-16 and ending on the 12th of every month   DICLOFENAC SODIUM (VOLTAREN) 1 % GEL    Apply 4 g topically 3 (three) times daily. Apply to back of knees bilaterally   FOLIC ACID (FOLVITE) 400 MCG TABLET    Take 400 mcg by mouth daily.   LEVETIRACETAM (KEPPRA XR) 500 MG 24 HR TABLET    Take 2 tablets (1,000 mg total) by mouth at bedtime.   NUTRITIONAL SUPPLEMENTS (NUTRITIONAL SUPPLEMENT PO)    House Supplement - Med Pass - Give 120 ml between meals daily  Modified Medications   No medications on file  Discontinued Medications   ASPIRIN 81 MG CHEWABLE TABLET    Chew 1  tablet (81 mg total) by mouth daily.     SIGNIFICANT DIAGNOSTIC EXAMS   PREVIOUS:   09-01-16: ct angio of chest:  1. No evidence of pulmonary embolus. 2. Scattered peripheral blebs noted bilaterally, reflecting mild emphysema. Mild atelectasis at the left lung base. 3. Scattered coronary artery calcifications.   09-04-16: ct of head: Chronic microvascular ischemia without acute intracranial abnormality  09-05-16: barium swallow: Limited though unremarkable barium swallow without definitive evidence of esophageal dysmotility, stricture or mucosal Abnormality.'  NO NEW EXAMS    LABS REVIEWED: PREVIOUS  09-01-16: wbc 8.3; hgb 12.4; hct 38.4; mcv 92.8; plt 371; glucose 83; bun 45; creat 1.50; k+ 4.9; na++ 133; ca 9.3; total bili: 3.9; albumin 3.6; blood culture: no growth; urine culture:mixed bacteria 09-04-16: glucose 94; bun 33; creat 1.30; k+ 4.6; na++ 139; ca 9.5;  Total bili 4.9; albumin 4.2 09-05-16: wbc 6.4; hgb 9.3; hct 28.4;  mcv 92.2; plt 296; glucose 91; bun 24; creat 0.95 ;k+ 4.1; na++ 139; ca 8.4; iron  58; tibc 356; ferritin 95; tsh 3.322; vit B 12: 169; folate 4.0 AFP tumor marker 1.8; CA19-9: 1  NO NEW LABS    Review of Systems  Constitutional: Negative for malaise/fatigue.  Respiratory: Negative for cough and shortness of breath.   Cardiovascular: Negative for chest pain, palpitations and leg swelling.  Gastrointestinal: Negative for abdominal pain, constipation and heartburn.  Musculoskeletal: Negative for back pain, joint pain and myalgias.  Skin: Negative.   Neurological: Negative for dizziness.  Psychiatric/Behavioral: The patient is not nervous/anxious.    Physical Exam  Constitutional: No distress.  Frail  HENT:  Rotten teeth  Eyes: Conjunctivae are normal.  Neck: Neck supple. No JVD present. No thyromegaly present.  Cardiovascular: Normal rate, regular rhythm and intact distal pulses.   Respiratory: Effort normal and breath sounds normal. No respiratory distress. He has no wheezes.  GI: Soft. Bowel sounds are normal. He exhibits no distension. There is no tenderness.  Musculoskeletal: He exhibits no edema.  Able to move all extremities   Lymphadenopathy:    He has no cervical adenopathy.  Neurological: He is alert.  Skin: Skin is warm and dry. He is not diaphoretic.  Psychiatric: He has a normal mood and affect.    ASSESSMENT/ PLAN:  Patient is being discharged with the following home health services:  Pt/ot/rn/cna/sw to evaluate and treat as indicated for gait balance strength adl training; medication management; adl care and community resources.   Patient is being discharged with the following durable medical equipment:  3:1 commode; front wheel walker in order for him to maintain his current level of independence with his adls.   Patient has been advised to f/u with their PCP in 1-2 weeks to bring them up to date on their rehab stay.  Social services at facility was responsible for arranging this appointment.  Pt was provided with a 30 day supply of prescriptions  for medications and refills must be obtained from their PCP.  For controlled substances, a more limited supply may be provided adequate until PCP appointment only.  30 day supply of the following medications; vit b 12 injection; baclofen; voltaren gel; keppra xl  No narcotics written    Time spent with patient 40 minutes;  discussing discharge plans; expected outcomes; and medications he did verbalize understanding ; plan and review of medical records   Synthia Innocent NP [facility]  Contact [phone] Monday through Friday 8am- 5pm  After hours call [phone]

## 2016-10-31 DIAGNOSIS — K089 Disorder of teeth and supporting structures, unspecified: Secondary | ICD-10-CM | POA: Insufficient documentation

## 2016-10-31 DIAGNOSIS — E8809 Other disorders of plasma-protein metabolism, not elsewhere classified: Secondary | ICD-10-CM | POA: Insufficient documentation

## 2016-10-31 DIAGNOSIS — E538 Deficiency of other specified B group vitamins: Secondary | ICD-10-CM | POA: Insufficient documentation

## 2016-10-31 DIAGNOSIS — Z8673 Personal history of transient ischemic attack (TIA), and cerebral infarction without residual deficits: Secondary | ICD-10-CM | POA: Insufficient documentation

## 2016-10-31 DIAGNOSIS — R4701 Aphasia: Secondary | ICD-10-CM | POA: Insufficient documentation

## 2016-11-16 ENCOUNTER — Ambulatory Visit (INDEPENDENT_AMBULATORY_CARE_PROVIDER_SITE_OTHER): Payer: Medicare Other | Admitting: Neurology

## 2016-11-16 ENCOUNTER — Encounter: Payer: Self-pay | Admitting: Neurology

## 2016-11-16 VITALS — BP 159/104 | HR 84 | Wt 123.2 lb

## 2016-11-16 DIAGNOSIS — I63512 Cerebral infarction due to unspecified occlusion or stenosis of left middle cerebral artery: Secondary | ICD-10-CM | POA: Diagnosis not present

## 2016-11-16 DIAGNOSIS — I6990 Unspecified sequelae of unspecified cerebrovascular disease: Secondary | ICD-10-CM

## 2016-11-16 MED ORDER — LEVETIRACETAM ER 500 MG PO TB24: 1000.0000 mg | ORAL_TABLET | Freq: Every day | ORAL | 3 refills | Status: DC

## 2016-11-16 NOTE — Progress Notes (Signed)
[facility]  PATIENT: Kyle Dudley DOB: 05-Sep-1940   HISTORY FROM: Patient and hospital chart REASON FOR VISIT: Stroke f/u  HISTORY OF PRESENT ILLNESS:  Kyle Dudley is an 76 y.o. male, right handed with a past medical history significant for an isolated seizure four years ago for which he takes keppra 2,000 mg daily, brought to [facility] by his family due to right sided weakness and dragging ofthe right leg. [name] stated that he never had similar symptoms before but this past Monday 05/09/2012 he woke up and noticed that he was not able to grab with his right hand and the right leg was weak. He did not experienced face weakness, headache, vertigo, double vision, difficulty swallowing, slurred speech, language or vision impairment. Stated that he has been dragging the right leg ever since. CT brain revealed age indeterminate hypodensity in the left corona radiata which was confirmed to be an acute lacunar infarct right CR on MRI-DWI. MRA brain unimpressive. Patient denies medication side effects, with no signs of bleeding.  Patient has completed outpatient therapy. He uses a cane to walk long distances. He states his blood pressure is running well at home though it is elevated at 161/87 in the office today. He does have upcoming visit with his primary physician this Friday. He has successfully quit smoking since his stroke. He is wanting to drive. He has history of a seizure 4 years ago and has been on Keppra and simply tolerating it well without side effects. UPDATE 10/11/2013 : Patient returns for followup of her last visit a year ago. He seems to be been fine without recurrence stroke or TIA symptoms. She has not also had any seizures. He remains on Keppra for seizures as well as aspirin for stroke prevention. He states his blood pressure is well controlled though it is slightly elevated at 142/81 office today. He has been losing weight recently and states that his heat is enough. He plans  to discuss this with his primary physician. He continues to use a cane for long distances but can walk indoors without it quite well. He has no other complaints today. Update 10/11/2014 ; she returns for follow-up after last visit 1 year ago. He continues to do well without recurrent stroke or TIA symptoms. He has not had any seizures either now for several years. He is tolerating aspirin well without significant bleeding or bruising. He states his blood pressure is under good control and today it is 118/71. His tolerating lisinopril well without side effects. He remains on Keppra XR 2 g at night and is tolerating it well. He has no complaints today. Update 11/07/2015 : He returns for follow-up after last visit a year ago. He continues to do well without recurrent TIA or stroke symptoms. He remains on aspirin 81 mg which is tolerating well without bruising or bleeding. He states his blood pressure is well controlled but does not check them regularly poor. Today upon arrival when the nurse checked it it was 159/78 but when I checked it personally 146/90. Patient has not had any recurrent seizures. At last visit the dose of Keppra XR was reduced to 1500 mg daily which is tolerating well without side effects. He has no complaints today. He has not had follow-up carotid ultrasound done in more than a year and a half Update 11/16/2016 : he returns for follow-up after last visit a year ago. He has had no recurrent TIA strokes or seizures. Remains on aspirin which is tolerating  well without bruising or bleeding. States his blood pressure is usually well controlled though today it is elevated in office at 159/104. He has not had any breakthrough seizures he remains on Keppra XR 1 gm at bedtime and is tolerating it well without dizziness. He does have mild gait and balance difficulties but uses a cane. He has had no falls or injuries. He did have some dysphagia and was admitted to the hospital in August 2018. He was  initially on a dysphagia diet but states that he has improved since discharge. He was supposed to have an outpatient upper endoscopy done by gastroenterology but this has not been done. REVIEW OF SYSTEMS: Full 14 system review of systems performed and notable only WGN:FAOZHYQ difficulty, imbalance, weakness, swallowing difficulty No complaints yet again today ALLERGIES: No Known Allergies  HOME MEDICATIONS: Outpatient Medications Prior to Visit  Medication Sig Dispense Refill  . acetaminophen (TYLENOL) 325 MG tablet Take 650 mg by mouth 3 (three) times daily.    Marland Kitchen aspirin EC 81 MG tablet Take 81 mg by mouth daily.    . baclofen (LIORESAL) 10 MG tablet Take 5 mg by mouth 4 (four) times daily.    . cyanocobalamin (,VITAMIN B-12,) 1000 MCG/ML injection Inject 1,000 mcg into the muscle once. Every Wednesday x 4 weeks then inject intramuscularly one time daily starting on 10-07-16 and ending on the 12th of every month    . diclofenac sodium (VOLTAREN) 1 % GEL Apply 4 g topically 3 (three) times daily. Apply to back of knees bilaterally    . folic acid (FOLVITE) 400 MCG tablet Take 400 mcg by mouth daily.    . Nutritional Supplements (NUTRITIONAL SUPPLEMENT PO) House Supplement - Med Pass - Give 120 ml between meals daily    . levETIRAcetam (KEPPRA XR) 500 MG 24 hr tablet Take 2 tablets (1,000 mg total) by mouth at bedtime.     No facility-administered medications prior to visit.     PAST MEDICAL HISTORY: Past Medical History:  Diagnosis Date  . Seizures (HCC)   . Stroke [facility])     PAST SURGICAL HISTORY: History reviewed. No pertinent surgical history.  FAMILY HISTORY: Family History  Problem Relation Age of Onset  . Stroke Mother   . Stroke Sister        2000    SOCIAL HISTORY: Social History   Social History  . Marital status: Single    Spouse name: N/A  . Number of children: 2  . Years of education: 9th   Occupational History  . Retired    Social History Main  Topics  . Smoking status: Former Smoker    Packs/day: 0.50    Years: 55.00    Types: Cigarettes    Quit date: 05/08/2012  . Smokeless tobacco: Never Used  . Alcohol use No  . Drug use: No  . Sexual activity: Not on file   Other Topics Concern  . Not on file   Social History Narrative   Patient is single and  lives at home alone.   Patient has two children.   Patient is retired.   Patient has a 9th grade education.   Caffeine Use: 5 cups of tea daily   Patient is right-handed.     PHYSICAL EXAM  Vitals:   11/16/16 1540  BP: (!) 159/104  Pulse: 84  Weight: 123 lb 3.2 oz (55.9 kg)   Body mass index is 17.68 kg/m.  GENERAL EXAM: Frail elderly African American male in  no distress, well developed and well groomed. HEAD: Symmetric facial features. EARS, NOSE, and THROAT: Normal.  NECK: Supple, no JVD RESPIRATORY: Lungs CTA. CARDIOVASCULAR: Regular rate and rhythm, no murmurs, no carotid bruits SKIN: No rash, no bruising Vitals:   11/16/16 1540  BP: (!) 159/104  Pulse: 84    NEUROLOGIC: MENTAL STATUS: awake, alert and oriented to person, place and time, language fluent, comprehension intact, naming intact CRANIAL NERVE:   fundoscopic exam not done., pupils equal and reactive to light, visual fields full to confrontation, extraocular muscles intact, no nystagmus, facial sensation and strength symmetric, uvula midline, shoulder shrug symmetric, tongue midline. MOTOR: normal bulk and tone, full strength in the BUE, BLE, decreased  fine finger movements on  Right.drags right leg. Increased tone right leg. SENSORY: normal and symmetric to light touch, pinprick, temperature, vibration  COORDINATION: finger-nose-finger REFLEXES: deep tendon reflexes present and symmetric 2+,  GAIT/STATION: slightly wide based gait and uses a cane;mild difficulty with tandem walking  DIAGNOSTIC DATA (LABS, IMAGING, TESTING) - I reviewed patient records, labs, notes, testing and imaging myself  where available.  Lab Results  Component Value Date   WBC 5.4 09/06/2016   HGB 9.3 (L) 09/06/2016   HCT 27.6 (L) 09/06/2016   MCV 94.2 09/06/2016   PLT 241 09/06/2016      Component Value Date/Time   NA 136 (A) 09/09/2016   K 4.7 09/09/2016   CL 103 09/06/2016 0548   CO2 22 09/06/2016 0548   GLUCOSE 90 09/06/2016 0548   BUN 10 09/09/2016   CREATININE 0.7 09/09/2016   CREATININE 0.72 09/06/2016 0548   CALCIUM 8.2 (L) 09/06/2016 0548   PROT 5.8 (L) 09/06/2016 0548   ALBUMIN 3.1 (L) 09/06/2016 0548   AST 29 09/09/2016   ALT 22 09/09/2016   ALKPHOS 87 09/09/2016   BILITOT 3.3 (H) 09/06/2016 0548   GFRNONAA >60 09/06/2016 0548   GFRAA >60 09/06/2016 0548   Lab Results  Component Value Date   CHOL 143 05/11/2012   HDL 41 05/11/2012   LDLCALC 93 05/11/2012   TRIG 47 05/11/2012   CHOLHDL 3.5 05/11/2012   Lab Results  Component Value Date   HGBA1C 4.9 05/11/2012   Lab Results  Component Value Date   VITAMINB12 169 (L) 09/05/2016   Lab Results  Component Value Date   TSH 3.322 09/05/2016   Ct Head Wo Contrast 05/10/2012    Chronic small vessel ischemia. Age indeterminate area of involvement in the left corona radiata (image 16). Otherwise no evidence of acute intracranial abnormality.  Mr Angiogram Head Wo Contrast 05/10/2012  No significant intracranial stenosis.   Mr Brain Wo Contrast 05/10/2012  No significant intracranial stenosis.   ECHO: Left ventricle: ejection fraction was in the range of 55% to 65%. No source of embolus.  Carotid doppler: No significant extracranial carotid artery stenosis demonstrated. Vertebrals are patent with antegrade flow.  Incidental finding: bilateral thyroid cysts.   ASSESSMENT AND PLAN  76 year old AA male with left centrum semioval infarct.  Infarct felt to be thrombotic due to small vessel disease. In April 2014.   History of seizures, on Keppra.  I had a long discussion the patient with regards to his remote stroke and  seizures and answered questions. Continue aspirin for stroke prevention with strict control of hypertension with blood pressure goal below 130/90 and lipids with LDL cholesterol goal below 70 0 mg percent. Continue Keppra XR  for seizure prevention and the current dose of 1000 mg at  night. Patient was given a refill for a year. He was advised to use a cane at all times and fall and safety prevention precautions. He was advised to continue follow-up with his primary care physician. Greater than 50% timeduring this 25 minute visit was spent on counseling and coordination of care about his remote stroke, seizures and answering questions.No routine scheduled follow-up appointment with me is necessary as it has been several years since he had TIA, stroke or seizures. He may return for follow-up in the future only if needed Delia HeadyPramod Sethi, MD  11/16/2016, 4:07 PM  [facility] [address], [address] [place], [place] [address] [phone]

## 2016-11-16 NOTE — Patient Instructions (Signed)
I had a long discussion the patient with regards to his remote stroke and seizures and answered questions. Continue aspirin for stroke prevention with strict control of hypertension with blood pressure goal below 130/90 and lipids with LDL cholesterol goal below 70 0 mg percent. Continue Keppra XR  for seizure prevention and the current dose of 1000 mg at night. Patient was given a refill for a year. He was advised to use a cane at all times and fall and safety prevention precautions. He was advised to continue follow-up with his primary care physician. No routine scheduled follow-up appointment with me is necessary as it has been several years since he had TIA, stroke or seizures. He may return for follow-up in the future only if needed

## 2016-11-30 ENCOUNTER — Other Ambulatory Visit: Payer: Self-pay | Admitting: Neurology

## 2018-12-10 IMAGING — DX DG KNEE COMPLETE 4+V*L*
4 series · 4 of 4 positions shown · non-contrast
Comparison: None.

CLINICAL DATA: Posterior knee pain with bending

EXAM:
LEFT KNEE - COMPLETE 4+ VIEW

[knee ap]
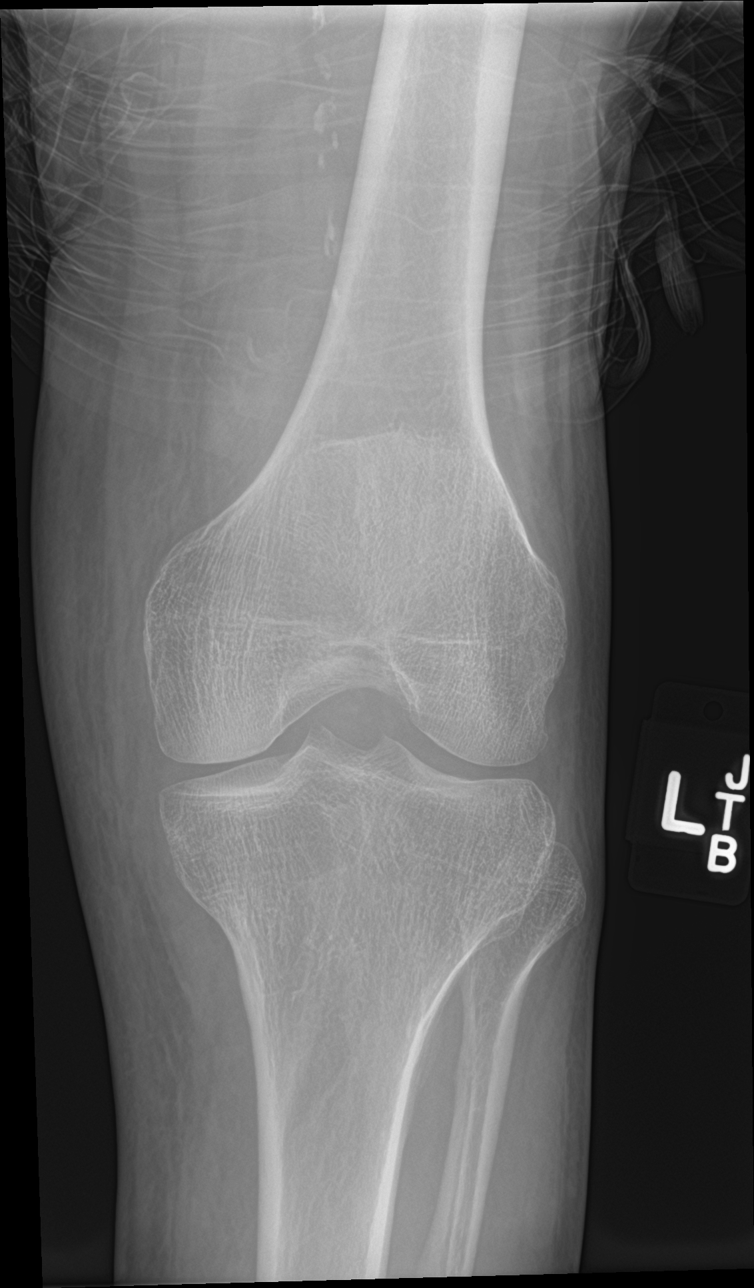

[knee lat]
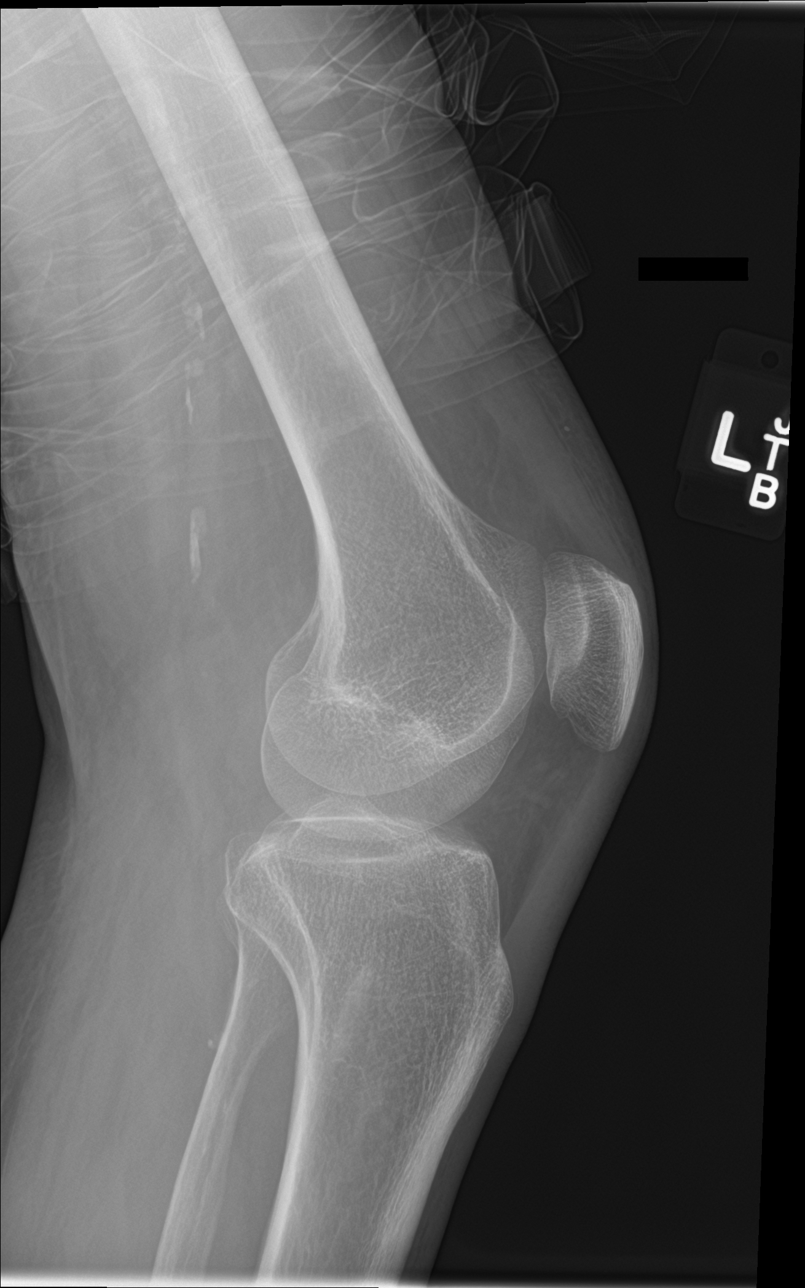

[knee obl (1 of 2)]
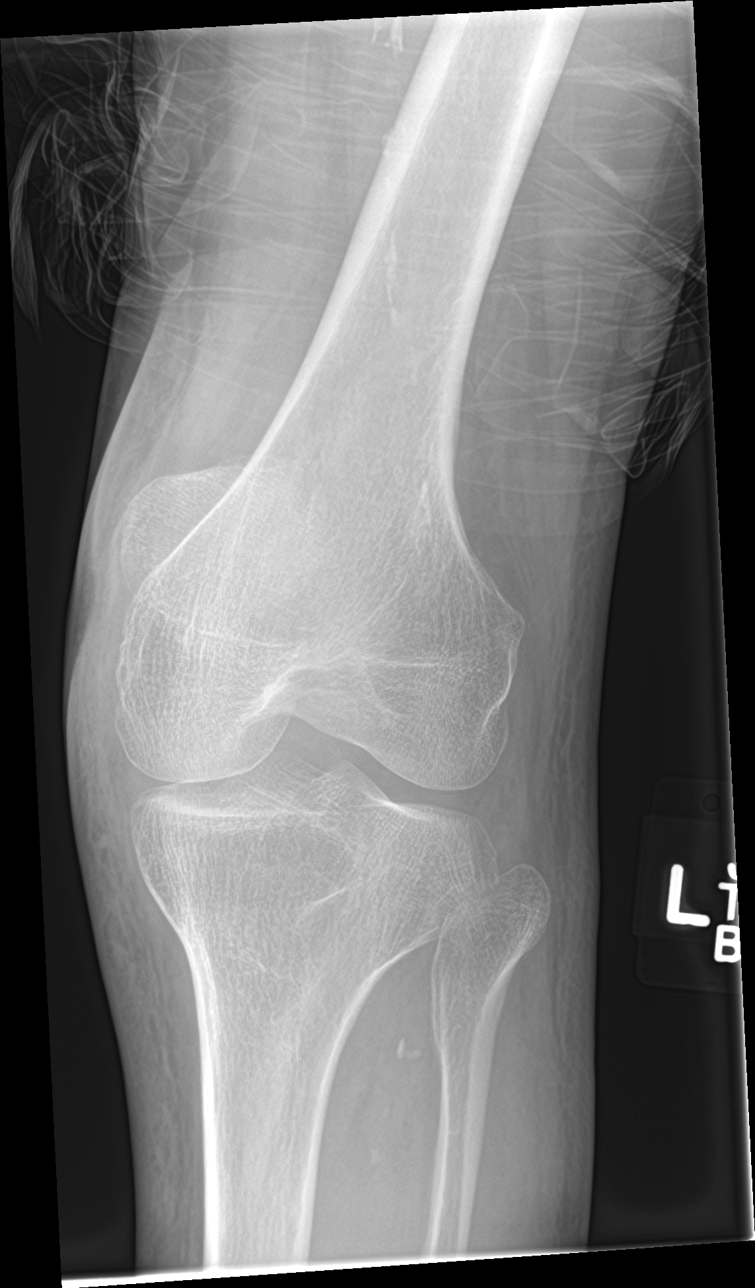

[knee obl (2 of 2)]
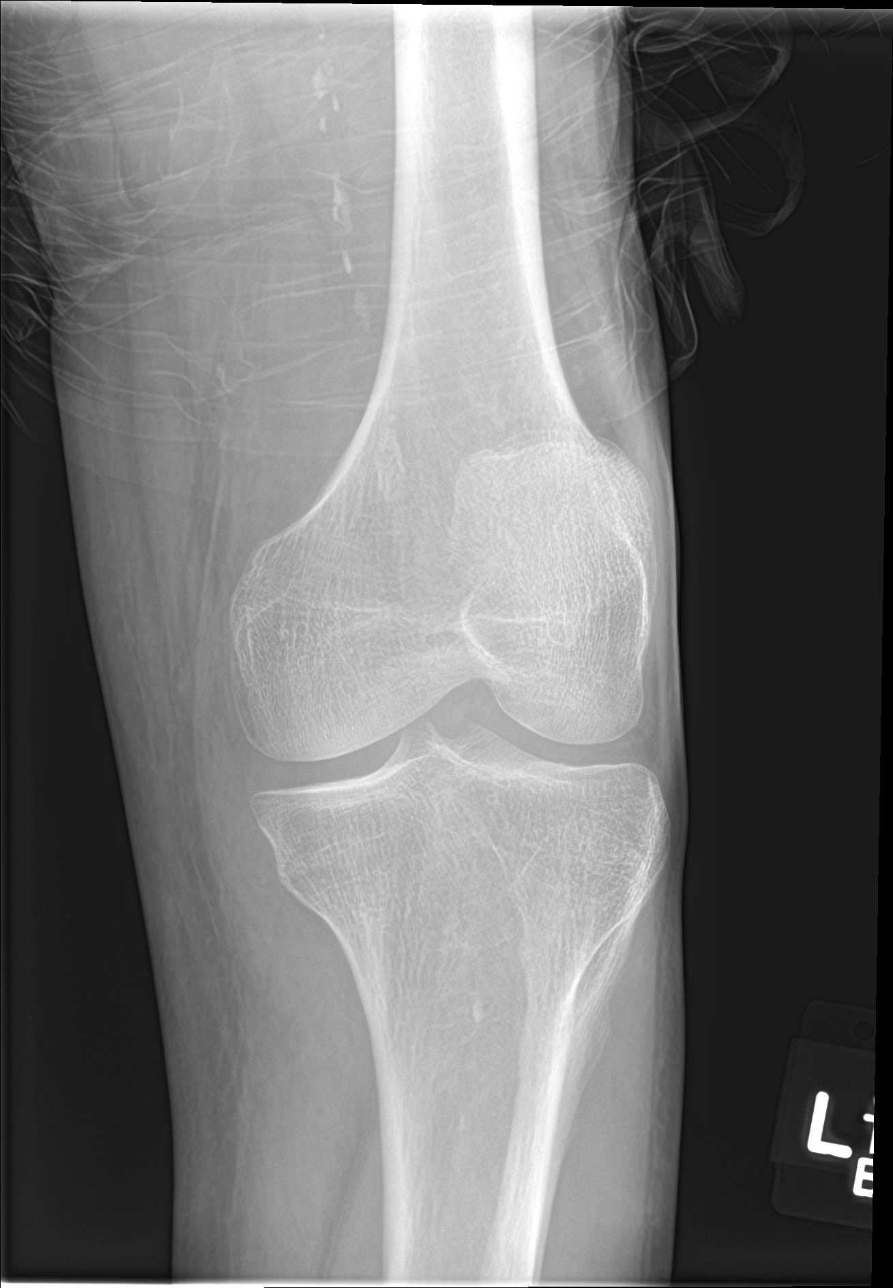

[4 of 4 positions shown; findings below may reference images not displayed]

FINDINGS: No fracture or dislocation is evident. No large joint effusion.
Minimal degenerative change of the medial compartment. Joint spaces
are relatively maintained. Vascular calcifications.
IMPRESSION: Negative.

## 2018-12-27 IMAGING — CT CT HEAD W/O CM
3 of 4 series · 15 of 47 positions shown, 18 images · non-contrast
Comparison: Brain MRI 05/10/2012

CLINICAL DATA: Generalized weakness

EXAM:
CT HEAD WITHOUT CONTRAST
TECHNIQUE: Contiguous axial images were obtained from the base of the skull
through the vertex without intravenous contrast.

[Series 2: head w/o · axial · non-contrast · 0.39mm/px · z∈[-184,-69]mm · 9 of 27 slices shown, 12 images]
[im 2/27  brain]
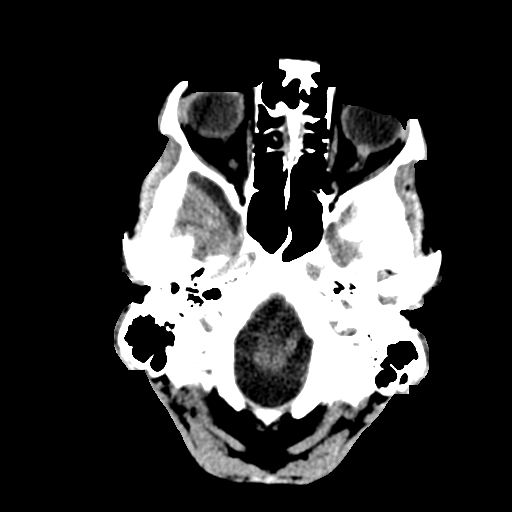
[im 2/27  bone]
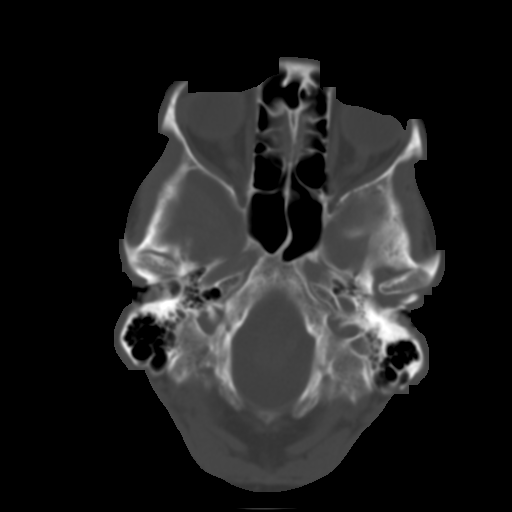
[im 6/27  brain]
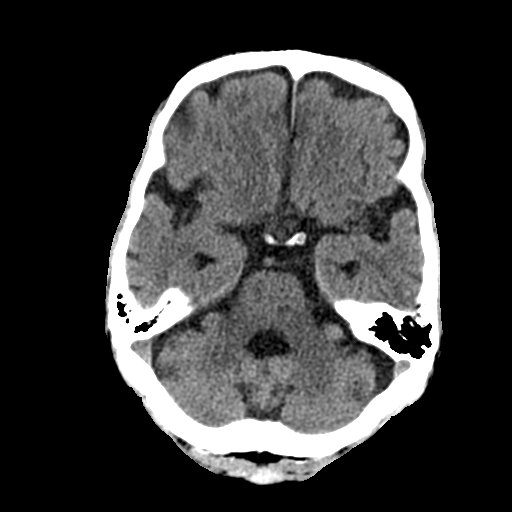
[im 8/27  brain]
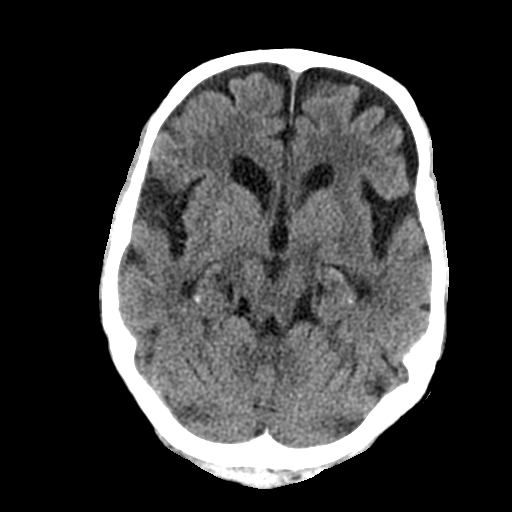
[im 12/27  brain]
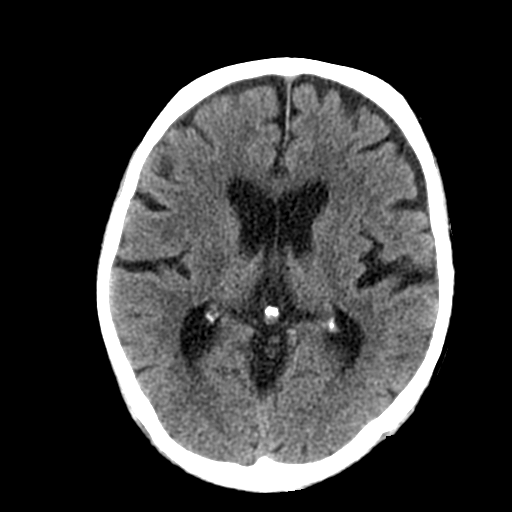
[im 14/27  brain]
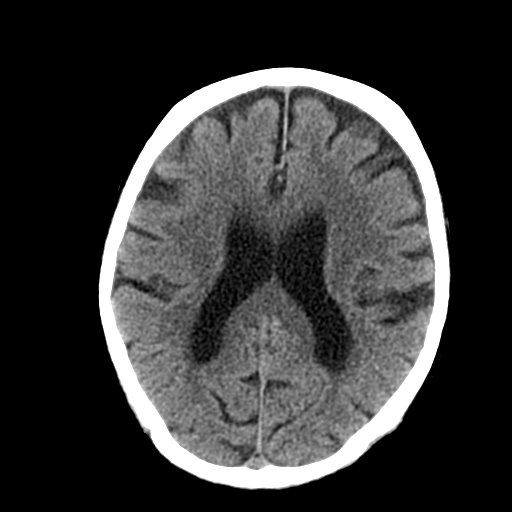
[im 14/27  bone]
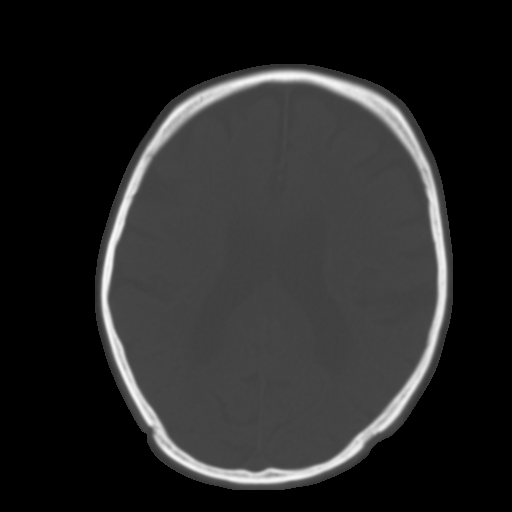
[im 15/27  brain]
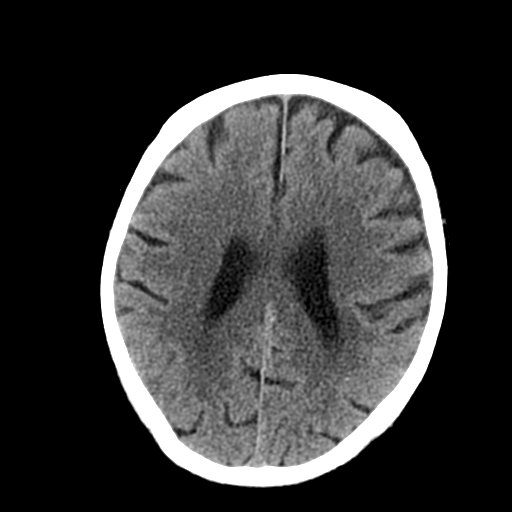
[im 19/27  brain]
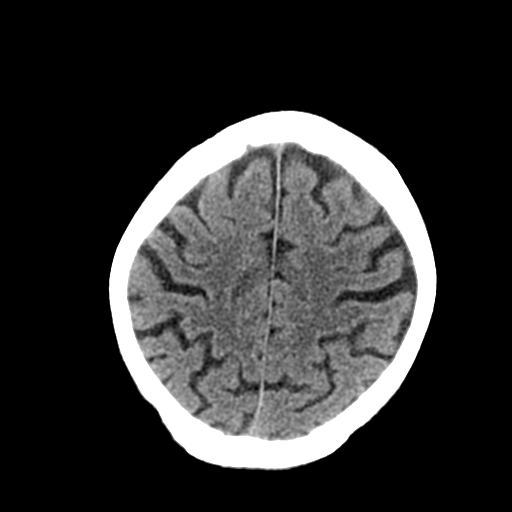
[im 21/27  brain]
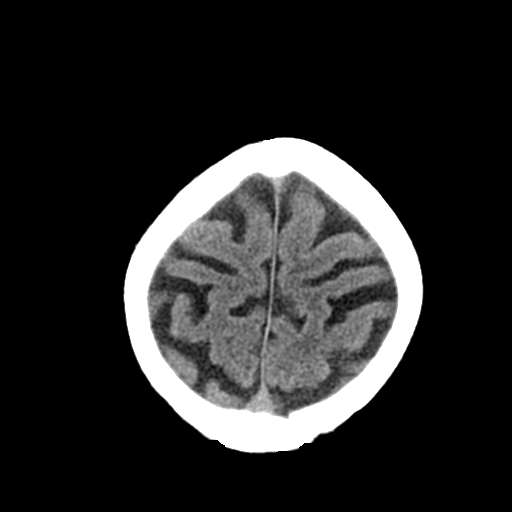
[im 25/27  brain]
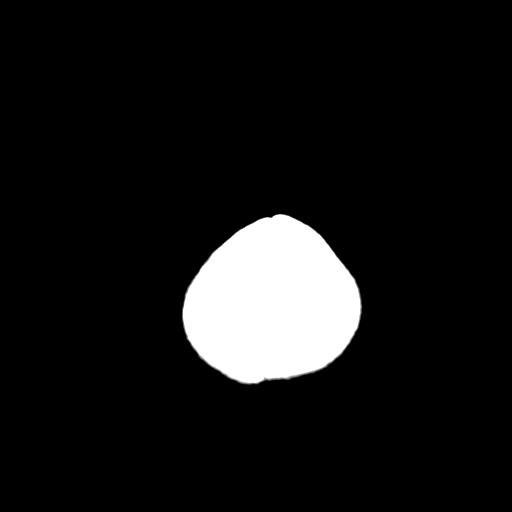
[im 25/27  bone]
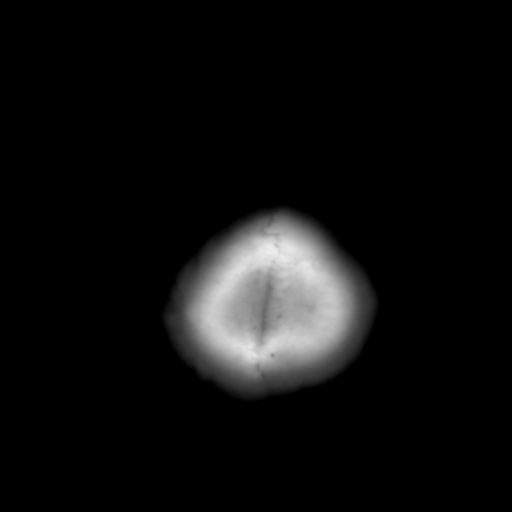

[Series 4: coronal · coronal · 0.26mm/px · 3 of 65 slices shown]
[im 22/65  brain]
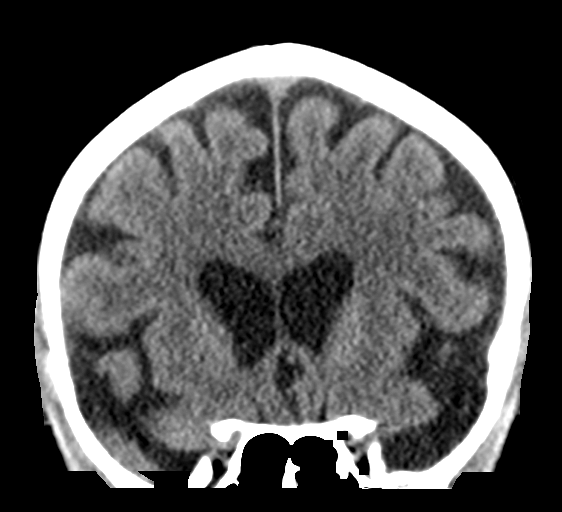
[im 29/65  brain]
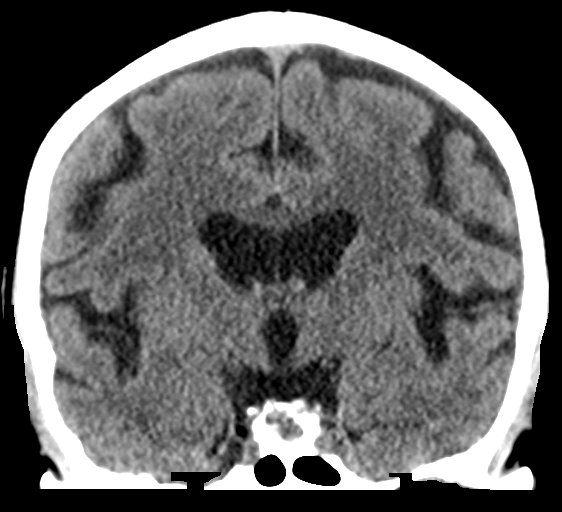
[im 36/65  brain]
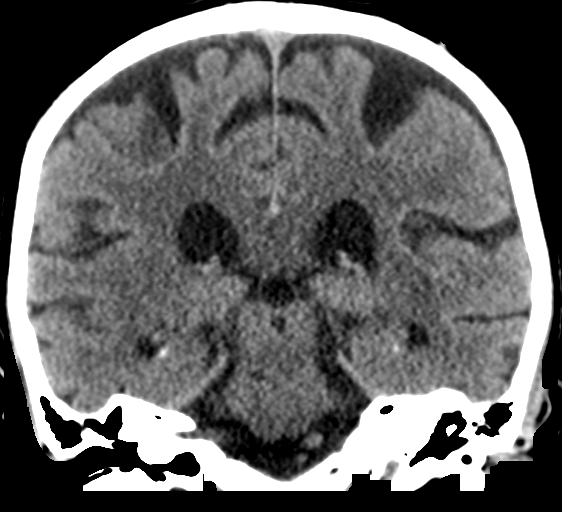

[Series 5: sagittal · sagittal · 0.26mm/px · 3 of 49 slices shown]
[im 17/49  brain]
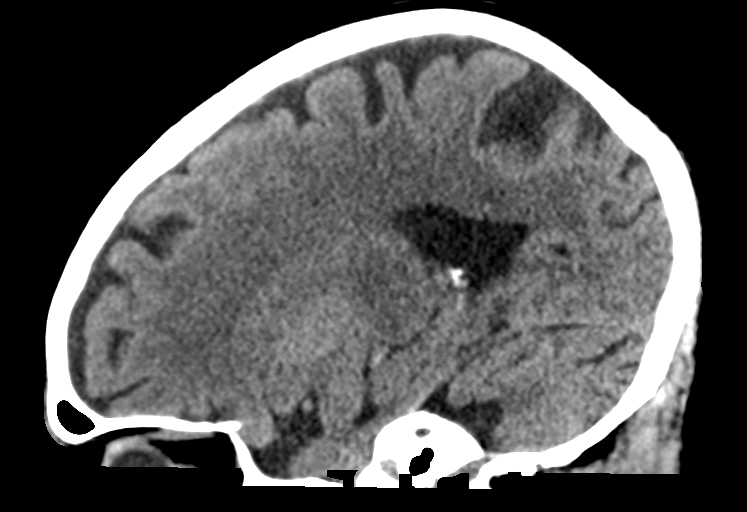
[im 25/49  brain]
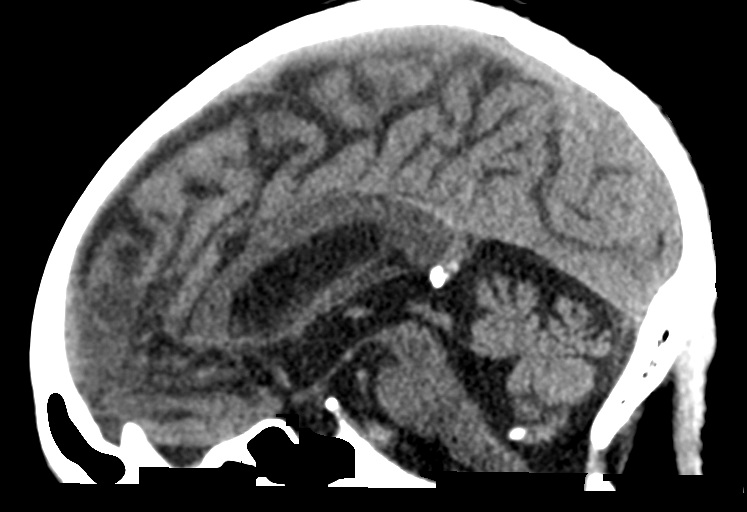
[im 33/49  brain]
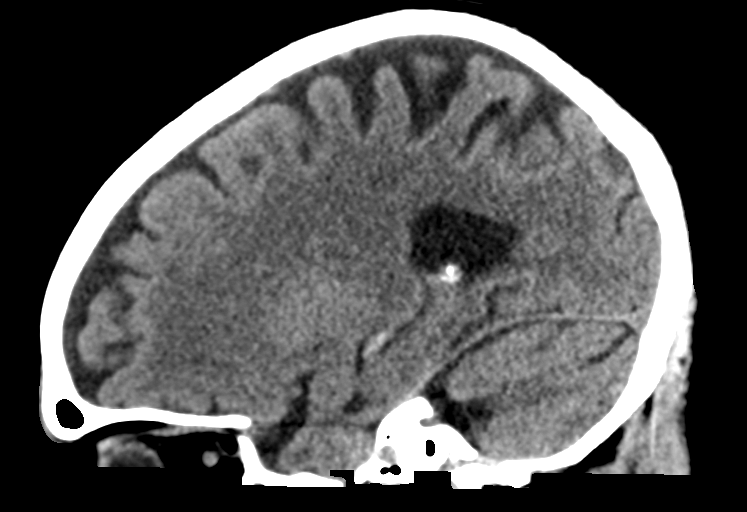

[15 of 47 positions shown; findings below may reference images not displayed]

FINDINGS: Brain: No mass lesion, intraparenchymal hemorrhage or extra-axial
collection. No evidence of acute cortical infarct. There is
periventricular hypoattenuation compatible with chronic
microvascular disease.

Vascular: No hyperdense vessel or unexpected calcification.

Skull: Normal visualized skull base, calvarium and extracranial soft
tissues.

Sinuses/Orbits: No sinus fluid levels or advanced mucosal
thickening. No mastoid effusion. Normal orbits.
IMPRESSION: Chronic microvascular ischemia without acute intracranial
abnormality.

## 2022-04-13 ENCOUNTER — Ambulatory Visit: Payer: 59 | Admitting: Neurology

## 2022-04-13 ENCOUNTER — Encounter: Payer: Self-pay | Admitting: Neurology

## 2022-06-11 ENCOUNTER — Telehealth: Payer: Self-pay | Admitting: Neurology

## 2022-06-11 ENCOUNTER — Encounter: Payer: Self-pay | Admitting: Neurology

## 2022-06-11 ENCOUNTER — Ambulatory Visit (INDEPENDENT_AMBULATORY_CARE_PROVIDER_SITE_OTHER): Payer: 59 | Admitting: Neurology

## 2022-06-11 VITALS — BP 120/71 | HR 76 | Ht 64.0 in | Wt 123.4 lb

## 2022-06-11 DIAGNOSIS — Z8669 Personal history of other diseases of the nervous system and sense organs: Secondary | ICD-10-CM

## 2022-06-11 DIAGNOSIS — Z8673 Personal history of transient ischemic attack (TIA), and cerebral infarction without residual deficits: Secondary | ICD-10-CM | POA: Diagnosis not present

## 2022-06-11 MED ORDER — LEVETIRACETAM ER 1000 MG PO TB24
1000.0000 mg | ORAL_TABLET | Freq: Every day | ORAL | 3 refills | Status: DC
Start: 2022-06-11 — End: 2022-06-11

## 2022-06-11 MED ORDER — LEVETIRACETAM ER 500 MG PO TB24: 1000.0000 mg | ORAL_TABLET | Freq: Every day | ORAL | 5 refills | Status: DC

## 2022-06-11 NOTE — Telephone Encounter (Signed)
I have sent a new script for the 500 mg XR strength and ordered to take 2 tab daily

## 2022-06-11 NOTE — Progress Notes (Signed)
[facility] [address] [place]. [place] [address] [phone]       OFFICE CONSULT NOTE  Kyle. Damiean Mitsch Date of Birth:  [birth year] Medical Record Number:  [id number]   Referring MD: Delbert Harness  Reason for Referral: History of stroke and seizures  HPI:Kyle Dudley is a 82 year old pleasant African-American male who is seen today for consultation for history of stroke and seizure.  History is obtained from the patient and review of electronic medical records.  I personally reviewed pertinent available imaging. films in PACS.  He has no significant past medical history. He has h/o a solitary seizure in 2011 following which he was started on Keppra and was doing well.  He was seen by me in 2015.  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  He has mild residual gait and balance difficulties dragging of the right leg.  He was last seen in the office on 11/16/2016 and lost to follow-up.  He has been referred back by his primary care physician to establish follow-up with [place] again.  Patient states he is doing well and has not had either recurrent stroke, TIA or seizures.  He remains on Keppra but has been taking 1000 mg twice daily though my last office note mentions Keppra XR 1000 mg once a day.  He is tolerating this without any side effects.  He had a single seizure in 2011 and following which she cannot remember any more seizures.  Continues to have mild stiffness and weakness of the right leg but can walk independently without assistance.  He has not had recurrent stroke or TIA symptoms.  He is tolerating aspirin well without bruising or bleeding.  States his blood pressure is under good control he has no complaints today.  He cannot tell me the last time he had lipid profile or hemoglobin A1c or carotid ultrasound checked.  He has no complaints today.  ROS:   14 system review of systems is positive for no  complaints today.  All systems negative  PMH:  Past Medical History:  Diagnosis Date   Seizures (HCC)    Stroke [facility])     Social History:  Social History   Socioeconomic History   Marital status: Single    Spouse name: Not on file   Number of children: 2   Years of education: 9th   Highest education level: Not on file  Occupational History   Occupation: Retired  Tobacco Use   Smoking status: Former    Packs/day: 0.50    Years: 55.00    Additional pack years: 0.00    Total pack years: 27.50    Types: Cigarettes    Quit date: 05/08/2012    Years since quitting: 10.0   Smokeless tobacco: Never  Substance and Sexual Activity   Alcohol use: No   Drug use: No   Sexual activity: Not on file  Other Topics Concern   Not on file  Social History Narrative   Patient is single and  lives at home alone.   Patient has two children.   Patient is retired.   Patient has a 9th grade education.   Caffeine Use: 5 cups of tea daily   Patient is right-handed.   Social Determinants of Health   Financial Resource Strain: Not on file  Food Insecurity: Not on file  Transportation Needs: Not on file  Physical Activity: Not on file  Stress:  Not on file  Social Connections: Not on file  Intimate Partner Violence: Not on file    Medications:   Current Outpatient Medications on File Prior to Visit  Medication Sig Dispense Refill   acetaminophen (TYLENOL) 325 MG tablet Take 650 mg by mouth 3 (three) times daily.     amLODipine (NORVASC) 5 MG tablet Take 5 mg by mouth daily.     aspirin EC 81 MG tablet Take 81 mg by mouth daily.     atorvastatin (LIPITOR) 10 MG tablet Take 1 tablet by mouth daily.     baclofen (LIORESAL) 10 MG tablet Take 5 mg by mouth 4 (four) times daily.     cyanocobalamin (,VITAMIN B-12,) 1000 MCG/ML injection Inject 1,000 mcg into the muscle once. Every Wednesday x 4 weeks then inject intramuscularly one time daily starting on 10-07-16 and ending on the 12th  of every month     diclofenac sodium (VOLTAREN) 1 % GEL Apply 4 g topically 3 (three) times daily. Apply to back of knees bilaterally     folic acid (FOLVITE) 400 MCG tablet Take 400 mcg by mouth daily.     levETIRAcetam (KEPPRA XR) 500 MG 24 hr tablet TAKE TWO TABLETS BY MOUTH ONCE DAILY 180 tablet 3   mirtazapine (REMERON) 15 MG tablet Take 15 mg by mouth at bedtime.     Nutritional Supplements (ENSURE ACTIVE HEART HEALTH) LIQD Take by mouth.     Nutritional Supplements (NUTRITIONAL SUPPLEMENT PO) House Supplement - Med Pass - Give 120 ml between meals daily     No current facility-administered medications on file prior to visit.    Allergies:  No Known Allergies  Physical Exam General: Frail elderly African-American male seated, in no evident distress Head: head normocephalic and atraumatic.   Neck: supple with no carotid or supraclavicular bruits Cardiovascular: regular rate and rhythm, no murmurs Musculoskeletal: no deformity Skin:  no rash/petichiae Vascular:  Normal pulses all extremities  Neurologic Exam Mental Status: Awake and fully alert. Oriented to place and time. Recent and remote memory intact. Attention span, concentration and fund of knowledge appropriate. Mood and affect appropriate.  Cranial Nerves: Fundoscopic exam reveals sharp disc margins. Pupils equal, briskly reactive to light. Extraocular movements full without nystagmus. Visual fields full to confrontation. Hearing intact. Facial sensation intact. Face, tongue, palate moves normally and symmetrically.  Motor: Normal bulk and tone. Normal strength in all tested extremity muscles.  Diminished fine finger movements on the right.  Orbits left or right upper extremity.  Slight weakness of right ankle dorsiflexors and extensors.  Tone is slightly decreased in the right leg Sensory.: intact to touch , pinprick , position and vibratory sensation.  Coordination: Rapid alternating movements normal in all extremities.  Finger-to-nose and heel-to-shin performed accurately bilaterally. Gait and Station: Arises from chair without difficulty. Stance is normal. Gait demonstrates normal stride length and balance . Mild dragging of the right leg with stiffness.  Able to heel, toe and tandem walk with mild difficulty.  Reflexes: 1+ and symmetric. Toes downgoing.   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     History of isolated seizure in 2011  well-controlled on Keppra..  No significant vascular risk factors except age and prior stroke   I had a long discussion with the patient regarding his remote lacunar stroke and seizures he seems to be doing quite well without  recurrent stroke, TIA or seizures 10 years.  I recommend he continue for stroke prevention and maintain aggressive risk factor modification with strict control of hypertension with blood pressure goal below 140/90, lipids with LDL cholesterol goal below 70 mg percent.  Continue Keppra XR 1000mg  once daily.  He has been incorrectly taking it twice daily and counseled him.  Check EEG and carotid ultrasound, lipid panel and HbA1c.Marland Kitchen  Avoid seizure provoking stimuli like sleep deprivation, medication noncompliance and extremes of exertion.  Return for follow-up in 6 months with nurse practitioner or call earlier if necessary. PLAN:   Note: This document was prepared with digital dictation and possible smart phrase technology. Any transcriptional errors that result from this process are unintentional.

## 2022-06-11 NOTE — Patient Instructions (Addendum)
I had a long discussion with the patient regarding his remote lacunar stroke and seizures he seems to be doing quite well without recurrent stroke, TIA or seizures 10 years.  I recommend he continue for stroke prevention and maintain aggressive risk factor modification with strict control of hypertension with blood pressure goal below 140/90, lipids with LDL cholesterol goal below 70 mg percent.  Continue Keppra XR 1000mg  once daily.  He has been incorrectly taking it twice daily and counseled him.  Check EEG and carotid ultrasound, lipid profile and hemoglobin A1c.  Avoid seizure provoking stimuli like sleep deprivation, medication noncompliance and extremes of exertion.  Return for follow-up in 6 months with nurse practitioner or call earlier if necessary.

## 2022-06-11 NOTE — Telephone Encounter (Signed)
[facility] called stating that the  levETIRAcetam 1000 MG TB24 does not come in XR but the 500mg  does. Pharmacy would like to know if provider would like to change the RX to 500mg  XR. Please advise.

## 2022-06-12 LAB — HEMOGLOBIN A1C
Est. average glucose Bld gHb Est-mCnc: 105 mg/dL
Hgb A1c MFr Bld: 5.3 % (ref 4.8–5.6)

## 2022-06-12 LAB — LIPID PANEL
Chol/HDL Ratio: 2.5 ratio (ref 0.0–5.0)
Cholesterol, Total: 121 mg/dL (ref 100–199)
HDL: 48 mg/dL (ref >39)
LDL Chol Calc (NIH): 62 mg/dL (ref 0–99)
Triglycerides: 48 mg/dL (ref 0–149)
VLDL Cholesterol Cal: 11 mg/dL (ref 5–40)

## 2022-06-12 NOTE — Progress Notes (Signed)
Kindly inform the patient that cholesterol profile and screening test for diabetes control are both quite satisfactory

## 2022-06-16 ENCOUNTER — Telehealth: Payer: Self-pay

## 2022-06-16 NOTE — Telephone Encounter (Signed)
Contacted pt, informed him that his cholesterol profile and screening for diabetes control were both satisfactory and look good.  Advised to call the office back with any questions or concerns as he had none at this time and was appreciative.

## 2022-06-16 NOTE — Telephone Encounter (Signed)
-----   Message from Deatra James, RN sent at 06/15/2022  7:37 AM EDT -----  ----- Message ----- From: Micki Riley, MD Sent: 06/12/2022   2:23 PM EDT To: Gna-Pod 2 Results  Kindly inform the patient that cholesterol profile and screening test for diabetes control are both quite satisfactory

## 2022-07-02 ENCOUNTER — Encounter: Payer: Self-pay | Admitting: *Deleted

## 2022-07-02 ENCOUNTER — Other Ambulatory Visit: Payer: 59 | Admitting: *Deleted

## 2022-12-13 ENCOUNTER — Other Ambulatory Visit: Payer: Self-pay | Admitting: Neurology

## 2022-12-21 ENCOUNTER — Encounter: Payer: Self-pay | Admitting: Adult Health

## 2022-12-21 ENCOUNTER — Ambulatory Visit (INDEPENDENT_AMBULATORY_CARE_PROVIDER_SITE_OTHER): Payer: 59 | Admitting: Adult Health

## 2022-12-21 VITALS — BP 129/73 | HR 70 | Ht 63.0 in | Wt 124.0 lb

## 2022-12-21 DIAGNOSIS — Z8669 Personal history of other diseases of the nervous system and sense organs: Secondary | ICD-10-CM | POA: Diagnosis not present

## 2022-12-21 DIAGNOSIS — Z8673 Personal history of transient ischemic attack (TIA), and cerebral infarction without residual deficits: Secondary | ICD-10-CM

## 2022-12-21 MED ORDER — LEVETIRACETAM ER 500 MG PO TB24: 1000.0000 mg | ORAL_TABLET | Freq: Every day | ORAL | 3 refills | Status: DC

## 2022-12-21 NOTE — Patient Instructions (Addendum)
Your Plan:  Continue Keppra XR 1000mg  (2x 500mg  tabs) daily for seizure prevention  Continue aspirin and atorvastatin for secondary stroke prevention measures  Continue to follow with your PCP for stroke risk factor management      Follow up in 1 year or call earlier if needed     Thank you for coming to see [place] at [facility]. I hope we have been able to provide you high quality care today.  You may receive a patient satisfaction survey over the next few weeks. We would appreciate your feedback and comments so that we may continue to improve ourselves and the health of our patients.

## 2022-12-21 NOTE — Progress Notes (Signed)
[facility] [address] [place]. [place] [address] [phone]       OFFICE FOLLOW UP NOTE  Kyle Dudley Date of Birth:  05-Apr-1940 Medical Record Number:  [id number]   Primary neurologist: Dr. Pearlean Brownie Reason for visit: hx of stroke and seizure  Chief Complaint  Patient presents with   Follow-up    Rm 3, here alone  Pt is here for follow up on stroke. Pt states he is doing well overall. No concerns.       HPI:  Update 12/21/2022 JM: Patient returns for 51-month follow-up visit.  Has been stable from neurological standpoint.  Denies new stroke/TIA symptoms.  Residual mild right hemiparesis stable.  Ambulates without AD, denies any recent falls.  Continues to maintain ADLs and IADLs independently. Denies any seizure activity on Keppra XR 1000 mg daily, denies side effects.  Compliant on aspirin and atorvastatin.  Routinely follows with PCP for stroke risk factor management.  No questions or concerns at this time.      Consult visit 06/11/2022 Dr. Pearlean Brownie: Kyle Dudley is a 82 year old pleasant African-American male who is seen today for consultation for history of stroke and seizure.  History is obtained from the patient and review of electronic medical records.  I personally reviewed pertinent available imaging. films in PACS.  He has no significant past medical history. He has h/o a solitary seizure in 2011 following which he was started on Keppra and was doing well.  He was seen by me in 2015.  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  He has mild residual gait and balance difficulties dragging of the right leg.  He was last seen in the office on 11/16/2016 and lost to follow-up.  He has been referred back by his primary care physician to establish follow-up with [place] again.  Patient states he is doing well and has not had either recurrent stroke, TIA or seizures.  He remains on Keppra but has been  taking 1000 mg twice daily though my last office note mentions Keppra XR 1000 mg once a day.  He is tolerating this without any side effects.  He had a single seizure in 2011 and following which she cannot remember any more seizures.  Continues to have mild stiffness and weakness of the right leg but can walk independently without assistance.  He has not had recurrent stroke or TIA symptoms.  He is tolerating aspirin well without bruising or bleeding.  States his blood pressure is under good control he has no complaints today.  He cannot tell me the last time he had lipid profile or hemoglobin A1c or carotid ultrasound checked.  He has no complaints today.    ROS:   14 system review of systems is positive for no complaints today.  All systems negative  PMH:  Past Medical History:  Diagnosis Date   Seizures (HCC)    Stroke [facility])     Social History:  Social History   Socioeconomic History   Marital status: Single    Spouse name: Not on file   Number of children: 2   Years of education: 9th   Highest education level: Not on file  Occupational History   Occupation: Retired  Tobacco Use   Smoking status: Former    Current packs/day: 0.00    Average packs/day: 0.5 packs/day for 55.0 years (27.5 ttl pk-yrs)    Types: Cigarettes  Start date: 05/08/1957    Quit date: 05/08/2012    Years since quitting: 10.6   Smokeless tobacco: Never  Substance and Sexual Activity   Alcohol use: No   Drug use: No   Sexual activity: Not on file  Other Topics Concern   Not on file  Social History Narrative   Patient is single and  lives at home alone.   Patient has two children.   Patient is retired.   Patient has a 9th grade education.   Caffeine Use: 5 cups of tea daily   Patient is right-handed.   Social Determinants of Health   Financial Resource Strain: Low Risk  (09/01/2022)   Received from [facility]   Overall Financial Resource Strain (CARDIA)    Difficulty of Paying Living  Expenses: Not hard at all  Food Insecurity: No Food Insecurity (09/01/2022)   Received from [facility]   Hunger Vital Sign    Worried About Running Out of Food in the Last Year: Never true    Ran Out of Food in the Last Year: Never true  Transportation Needs: No Transportation Needs (09/01/2022)   Received from [facility] - Transportation    Lack of Transportation (Medical): No    Lack of Transportation (Non-Medical): No  Physical Activity: Insufficiently Active (02/02/2022)   Received from [facility]   Exercise Vital Sign    Days of Exercise per Week: 3 days    Minutes of Exercise per Session: 30 min  Stress: No Stress Concern Present (02/02/2022)   [facility] - Occupational Stress Questionnaire    Feeling of Stress : Not at all  Social Connections: Socially Integrated (02/02/2022)   Received from [facility], [facility]   Social Network    How would you rate your social network (family, work, friends)?: Good participation with social networks  Intimate Partner Violence: Not At Risk (02/02/2022)   Received from [facility], [facility]   HITS    Over the last 12 months how often did your partner physically hurt you?: Never    Over the last 12 months how often did your partner insult you or talk down to you?: Never    Over the last 12 months how often did your partner threaten you with physical harm?: Never    Over the last 12 months how often did your partner scream or curse at you?: Never    Medications:   Current Outpatient Medications on File Prior to Visit  Medication Sig Dispense Refill   acetaminophen (TYLENOL) 325 MG tablet Take 650 mg by mouth 3 (three) times daily.     amLODipine (NORVASC) 5 MG tablet Take 5 mg by mouth daily.     aspirin EC 81 MG tablet Take 81 mg by mouth daily.     atorvastatin (LIPITOR) 10 MG tablet Take 1 tablet by mouth daily.      levETIRAcetam (KEPPRA XR) 500 MG 24 hr tablet Take 2 tablets by mouth once daily 60 tablet 0   Nutritional Supplements (ENSURE ACTIVE HEART HEALTH) LIQD Take by mouth.     Nutritional Supplements (NUTRITIONAL SUPPLEMENT PO) House Supplement - Med Pass - Give 120 ml between meals daily     folic acid (FOLVITE) 400 MCG tablet Take 400 mcg by mouth daily. (Patient not taking: Reported on 12/21/2022)     No current facility-administered medications on file prior to visit.  Allergies:  No Known Allergies  Physical Exam Today's Vitals   12/21/22 1047  BP: 129/73  Pulse: 70  Weight: 124 lb (56.2 kg)  Height: 5\' 3"  (1.6 m)   Body mass index is 21.97 kg/m.   General: Frail pleasant elderly African-American male seated, in no evident distress  Neurologic Exam Mental Status: Awake and fully alert. Oriented to place and time. Recent and remote memory intact. Attention span, concentration and fund of knowledge appropriate. Mood and affect appropriate.  Cranial Nerves: Pupils equal, briskly reactive to light. Extraocular movements full without nystagmus. Visual fields full to confrontation. Hearing intact. Facial sensation intact. Face, tongue, palate moves normally and symmetrically.  Motor: Normal bulk and tone. Normal strength in all tested extremity muscles.  Diminished fine finger movements on the right.  Slightly orbits left over right upper extremity.  Slight weakness of right ankle dorsiflexors and extensors.  Tone is slightly decreased in the right leg Sensory.: intact to touch , pinprick , position and vibratory sensation.  Coordination: Rapid alternating movements normal in all extremities. Finger-to-nose and heel-to-shin performed accurately bilaterally. Gait and Station: Arises from chair without difficulty. Stance is normal. Gait demonstrates normal stride length and balance. Mild dragging of the right leg with stiffness.  No use of AD. Reflexes: 1+ and symmetric. Toes downgoing.       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     History of isolated seizure in 2011  well-controlled on Keppra..  Vascular risk factors of HTN, HLD and advanced age.    1.  Partial seizure -No additional seizure activity -continue Keppra XR 1000 mg daily - refill provided -Advised to call with any additional seizure activity  2. Hx of stroke -Continue aspirin 81 mg daily and atorvastatin 10 mg daily for secondary stroke prevention measures managed/prescribed by PCP -Continue close PCP follow-up for aggressive stroke risk factor management -Stroke labs 05/2022: LDL 62, A1c 5.3    Follow-up in 1 year or call earlier if needed    I spent 25 minutes of face-to-face and non-face-to-face time with patient.  This included previsit chart review, lab review, study review, order entry, electronic health record documentation, patient education and discussion regarding above diagnoses and treatment plan and answered all other questions to patient's satisfaction  Ihor Austin, [facility]  [facility] [address] [place], [place] [address]  Phone [phone] Fax [phone] Note: This document was prepared with digital dictation and possible smart phrase technology. Any transcriptional errors that result from this process are unintentional.

## 2023-12-21 ENCOUNTER — Ambulatory Visit: Payer: 59 | Admitting: Adult Health

## 2023-12-22 ENCOUNTER — Ambulatory Visit: Payer: 59 | Admitting: Adult Health

## 2023-12-22 ENCOUNTER — Encounter: Payer: Self-pay | Admitting: Adult Health

## 2023-12-22 VITALS — BP 132/79 | HR 76 | Ht 65.0 in | Wt 123.0 lb

## 2023-12-22 DIAGNOSIS — Z8673 Personal history of transient ischemic attack (TIA), and cerebral infarction without residual deficits: Secondary | ICD-10-CM | POA: Diagnosis not present

## 2023-12-22 DIAGNOSIS — Z8669 Personal history of other diseases of the nervous system and sense organs: Secondary | ICD-10-CM

## 2023-12-22 MED ORDER — LEVETIRACETAM ER 500 MG PO TB24: 1000.0000 mg | ORAL_TABLET | Freq: Every day | ORAL | 3 refills | Status: AC

## 2023-12-22 NOTE — Progress Notes (Signed)
 [facility] [address] [place]. [place] [address] [phone]       OFFICE FOLLOW UP NOTE  Mr. Panfilo Ketchum Date of Birth:  1940-08-13 Medical Record Number:  [id number]   Primary neurologist: Dr. Rosemarie Reason for visit: hx of stroke and seizure  Chief Complaint  Patient presents with   Cerebrovascular Accident    Rm 3 alone  Pt is well and stable, reports no new stroke concerns.       HPI:  Update 12/22/2023 JM: Patient returns for follow-up visit unaccompanied.  Has been stable from neurological standpoint over the past year.  No new stroke/TIA symptoms.  Reports compliance on aspirin  and atorvastatin and routine follow-up with PCP for stroke risk factor management.  Denies any seizure activity on Keppra  XR 1000 mg daily.  Denies side effects.  No questions or concerns at this time.      Update 12/21/2022 JM: Patient returns for 82-month follow-up visit.  Has been stable from neurological standpoint.  Denies new stroke/TIA symptoms.  Residual mild right hemiparesis stable.  Ambulates without AD, denies any recent falls.  Continues to maintain ADLs and IADLs independently. Denies any seizure activity on Keppra  XR 1000 mg daily, denies side effects.  Compliant on aspirin  and atorvastatin.  Routinely follows with PCP for stroke risk factor management.  No questions or concerns at this time.  Consult visit 06/11/2022 Dr. Rosemarie: Mr Tuggle is a 83 year old pleasant African-American male who is seen today for consultation for history of stroke and seizure.  History is obtained from the patient and review of electronic medical records.  I personally reviewed pertinent available imaging. films in PACS.  He has no significant past medical history. He has h/o a solitary seizure in 2011 following which he was started on Keppra  and was doing well.  He was seen by me in 2015.  For right-sided weakness and dragging of the right leg and was found to have left corona radiator infarct  on MRI scan.  He has mild residual gait and balance difficulties dragging of the right leg.  He was last seen in the office on 11/16/2016 and lost to follow-up.  He has been referred back by his primary care physician to establish follow-up with us  again.  Patient states he is doing well and has not had either recurrent stroke, TIA or seizures.  He remains on Keppra  but has been taking 1000 mg twice daily though my last office note mentions Keppra  XR 1000 mg once a day.  He is tolerating this without any side effects.  He had a single seizure in 2011 and following which she cannot remember any more seizures.  Continues to have mild stiffness and weakness of the right leg but can walk independently without assistance.  He has not had recurrent stroke or TIA symptoms.  He is tolerating aspirin  well without bruising or bleeding.  States his blood pressure is under good control he has no complaints today.  He cannot tell me the last time he had lipid profile or hemoglobin A1c or carotid ultrasound checked.  He has no complaints today.    ROS:   14 system review of systems is positive for no complaints today.  All systems negative  PMH:  Past Medical History:  Diagnosis Date   Seizures (HCC)    Stroke [facility])     Social History:  Social History   Socioeconomic History   Marital status: Single    Spouse name: Not on file  Number of children: 2   Years of education: 9th   Highest education level: Not on file  Occupational History   Occupation: Retired  Tobacco Use   Smoking status: Former    Current packs/day: 0.00    Average packs/day: 0.5 packs/day for 55.0 years (27.5 ttl pk-yrs)    Types: Cigarettes    Start date: 05/08/1957    Quit date: 05/08/2012    Years since quitting: 11.6   Smokeless tobacco: Never  Substance and Sexual Activity   Alcohol use: No   Drug use: No   Sexual activity: Not on file  Other Topics Concern   Not on file  Social History Narrative   Patient is single  and  lives at home alone.   Patient has two children.   Patient is retired.   Patient has a 9th grade education.   Caffeine Use: 5 cups of tea daily   Patient is right-handed.   Social Drivers of Corporate Investment Banker Strain: Low Risk  (02/09/2023)   Received from [facility]   Overall Financial Resource Strain (CARDIA)    Difficulty of Paying Living Expenses: Not hard at all  Food Insecurity: No Food Insecurity (02/09/2023)   [facility]   Hunger Vital Sign    Within the past 12 months, you worried that your food would run out before you got the money to buy more.: Never true    Within the past 12 months, the food you bought just didn't last and you didn't have money to get more.: Never true  Transportation Needs: No Transportation Needs (02/09/2023)   Received from [facility] - Transportation    Lack of Transportation (Medical): No    Lack of Transportation (Non-Medical): No  Physical Activity: Insufficiently Active (02/09/2023)   Received from [facility]   Exercise Vital Sign    On average, how many days per week do you engage in moderate to strenuous exercise (like a brisk walk)?: 2 days    On average, how many minutes do you engage in exercise at this level?: 30 min  Stress: No Stress Concern Present (02/09/2023)   Received from [facility] - Occupational Stress Questionnaire    Feeling of Stress : Not at all  Social Connections: Moderately Integrated (02/09/2023)   Received from [facility]   Social Network    How would you rate your social network (family, work, friends)?: Adequate participation with social networks  Intimate Partner Violence: Not At Risk (02/09/2023)   Received from [facility]   HITS    Over the last 12 months how often did your partner physically hurt you?: Never    Over the last 12 months how often did your partner insult you or talk down to you?: Never    Over the  last 12 months how often did your partner threaten you with physical harm?: Never    Over the last 12 months how often did your partner scream or curse at you?: Never    Medications:   Current Outpatient Medications on File Prior to Visit  Medication Sig Dispense Refill   amLODipine  (NORVASC ) 5 MG tablet Take 5 mg by mouth daily.     aspirin  EC 81 MG tablet Take 81 mg by mouth daily.     atorvastatin (LIPITOR) 10 MG tablet Take 1 tablet by mouth daily.     levETIRAcetam  (KEPPRA  XR) 500 MG 24 hr tablet Take  2 tablets (1,000 mg total) by mouth daily. 180 tablet 3   No current facility-administered medications on file prior to visit.    Allergies:  No Known Allergies  Physical Exam Today's Vitals   12/22/23 1116  BP: 132/79  Pulse: 76  Weight: 123 lb (55.8 kg)  Height: 5' 5 (1.651 m)   Body mass index is 20.47 kg/m.   General: Frail pleasant elderly African-American male seated, in no evident distress  Neurologic Exam Mental Status: Awake and fully alert. Oriented to place and time. Recent and remote memory intact. Attention span, concentration and fund of knowledge appropriate. Mood and affect appropriate.  Cranial Nerves: Pupils equal, briskly reactive to light. Extraocular movements full without nystagmus. Visual fields full to confrontation. Hearing intact. Facial sensation intact. Face, tongue, palate moves normally and symmetrically.  Motor: Normal bulk and tone. Normal strength in all tested extremity muscles.  Diminished fine finger movements on the right.  Slightly orbits left over right upper extremity.  Slight weakness of right ankle dorsiflexors and extensors.  Tone is slightly decreased in the right leg Sensory.: intact to touch , pinprick , position and vibratory sensation.  Coordination: Rapid alternating movements normal in all extremities. Finger-to-nose and heel-to-shin performed accurately bilaterally. Gait and Station: Arises from chair without difficulty.  Stance is normal. Gait demonstrates normal stride length and balance. Mild dragging of the right leg with stiffness.  No use of AD. Reflexes: 1+ and symmetric. Toes downgoing.      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     History of isolated seizure in 2011  well-controlled on Keppra ..  Vascular risk factors of HTN, HLD and advanced age.    1.  Partial seizure -No additional seizure activity -continue Keppra  XR 1000 mg daily - refill provided -Advised to call with any additional seizure activity  2. Hx of stroke -Continue aspirin  81 mg daily and atorvastatin 10 mg daily for secondary stroke prevention measures managed/prescribed by PCP -Continue close PCP follow-up for aggressive stroke risk factor management     Patient requests to follow up here just as needed due to transportation difficulties. Will reach out to PCP to see if she would be willing to take over the yearly prescribing and patient can follow back up here if he should have any recurrent seizures or indication to make adjustments to keppra  dosage. If unable to assist with this, patient will follow up in 1 year. He verbalized understanding.       Harlene Bogaert, AGNP-BC  [facility] [address] [place], [place] [address]  Phone [phone] Fax [phone] Note: This document was prepared with digital dictation and possible smart phrase technology. Any transcriptional errors that result from this process are unintentional.
